# Patient Record
Sex: Male | Born: 1940 | Race: White | Hispanic: No | Marital: Married | Smoking: Current every day smoker
Health system: Southern US, Community
[De-identification: ages and names within clinical notes are randomized; demographics above are authoritative.]

## PROBLEM LIST (undated history)

## (undated) DIAGNOSIS — I2510 Atherosclerotic heart disease of native coronary artery without angina pectoris: Secondary | ICD-10-CM

## (undated) DIAGNOSIS — C801 Malignant (primary) neoplasm, unspecified: Secondary | ICD-10-CM

## (undated) HISTORY — PX: CATARACT EXTRACTION: SUR2

## (undated) HISTORY — PX: OTHER SURGICAL HISTORY: SHX169

## (undated) HISTORY — PX: LUNG REMOVAL, PARTIAL: SHX233

---

## 1999-01-15 ENCOUNTER — Ambulatory Visit (HOSPITAL_COMMUNITY): Admission: RE | Admit: 1999-01-15 | Discharge: 1999-01-15 | Payer: Self-pay | Admitting: Family Medicine

## 1999-01-30 ENCOUNTER — Ambulatory Visit: Admission: RE | Admit: 1999-01-30 | Discharge: 1999-01-30 | Payer: Self-pay | Admitting: Vascular Surgery

## 1999-02-06 ENCOUNTER — Ambulatory Visit: Admission: RE | Admit: 1999-02-06 | Discharge: 1999-02-06 | Payer: Self-pay | Admitting: Vascular Surgery

## 1999-02-11 ENCOUNTER — Inpatient Hospital Stay: Admission: RE | Admit: 1999-02-11 | Discharge: 1999-02-15 | Payer: Self-pay | Admitting: Vascular Surgery

## 1999-02-11 ENCOUNTER — Encounter (INDEPENDENT_AMBULATORY_CARE_PROVIDER_SITE_OTHER): Payer: Self-pay | Admitting: Specialist

## 1999-02-12 ENCOUNTER — Encounter: Payer: Self-pay | Admitting: Vascular Surgery

## 1999-05-21 ENCOUNTER — Encounter: Payer: Self-pay | Admitting: Vascular Surgery

## 1999-05-22 ENCOUNTER — Ambulatory Visit: Admission: RE | Admit: 1999-05-22 | Discharge: 1999-05-22 | Payer: Self-pay | Admitting: Vascular Surgery

## 1999-11-06 ENCOUNTER — Inpatient Hospital Stay: Admission: EM | Admit: 1999-11-06 | Discharge: 1999-11-13 | Payer: Self-pay | Admitting: *Deleted

## 1999-11-06 ENCOUNTER — Encounter: Payer: Self-pay | Admitting: Vascular Surgery

## 1999-11-07 ENCOUNTER — Encounter: Payer: Self-pay | Admitting: Vascular Surgery

## 1999-11-12 ENCOUNTER — Encounter: Payer: Self-pay | Admitting: Vascular Surgery

## 1999-11-27 ENCOUNTER — Encounter: Payer: Self-pay | Admitting: Vascular Surgery

## 1999-11-27 ENCOUNTER — Encounter: Admission: RE | Admit: 1999-11-27 | Discharge: 1999-11-27 | Payer: Self-pay | Admitting: Vascular Surgery

## 2000-07-20 ENCOUNTER — Encounter: Payer: Self-pay | Admitting: Thoracic Surgery

## 2000-07-20 ENCOUNTER — Inpatient Hospital Stay (HOSPITAL_COMMUNITY): Admission: EM | Admit: 2000-07-20 | Discharge: 2000-07-30 | Payer: Self-pay | Admitting: Emergency Medicine

## 2000-07-21 ENCOUNTER — Encounter: Payer: Self-pay | Admitting: Thoracic Surgery

## 2000-07-22 ENCOUNTER — Encounter: Payer: Self-pay | Admitting: Thoracic Surgery

## 2000-08-18 ENCOUNTER — Emergency Department (HOSPITAL_COMMUNITY): Admission: EM | Admit: 2000-08-18 | Discharge: 2000-08-18 | Payer: Self-pay | Admitting: Emergency Medicine

## 2000-08-18 ENCOUNTER — Encounter: Payer: Self-pay | Admitting: Emergency Medicine

## 2000-11-16 ENCOUNTER — Inpatient Hospital Stay (HOSPITAL_COMMUNITY): Admission: RE | Admit: 2000-11-16 | Discharge: 2000-11-27 | Payer: Self-pay | Admitting: Vascular Surgery

## 2000-11-16 ENCOUNTER — Encounter: Payer: Self-pay | Admitting: Vascular Surgery

## 2001-04-07 ENCOUNTER — Encounter: Payer: Self-pay | Admitting: Vascular Surgery

## 2001-04-08 ENCOUNTER — Ambulatory Visit (HOSPITAL_COMMUNITY): Admission: RE | Admit: 2001-04-08 | Discharge: 2001-04-08 | Payer: Self-pay | Admitting: Vascular Surgery

## 2001-04-12 ENCOUNTER — Encounter: Payer: Self-pay | Admitting: Vascular Surgery

## 2001-04-12 ENCOUNTER — Inpatient Hospital Stay (HOSPITAL_COMMUNITY): Admission: RE | Admit: 2001-04-12 | Discharge: 2001-04-20 | Payer: Self-pay | Admitting: Vascular Surgery

## 2003-04-18 ENCOUNTER — Encounter: Payer: Self-pay | Admitting: Emergency Medicine

## 2003-04-18 ENCOUNTER — Inpatient Hospital Stay (HOSPITAL_COMMUNITY): Admission: EM | Admit: 2003-04-18 | Discharge: 2003-04-19 | Payer: Self-pay | Admitting: Emergency Medicine

## 2003-04-18 ENCOUNTER — Encounter: Payer: Self-pay | Admitting: Internal Medicine

## 2006-07-11 ENCOUNTER — Emergency Department (HOSPITAL_COMMUNITY): Admission: EM | Admit: 2006-07-11 | Discharge: 2006-07-11 | Payer: Self-pay | Admitting: Emergency Medicine

## 2008-03-26 ENCOUNTER — Encounter: Payer: Self-pay | Admitting: Emergency Medicine

## 2008-03-26 ENCOUNTER — Inpatient Hospital Stay (HOSPITAL_COMMUNITY)
Admission: AD | Admit: 2008-03-26 | Discharge: 2008-03-30 | Payer: Self-pay | Admitting: Thoracic Surgery (Cardiothoracic Vascular Surgery)

## 2008-03-26 ENCOUNTER — Ambulatory Visit: Payer: Self-pay | Admitting: Thoracic Surgery (Cardiothoracic Vascular Surgery)

## 2010-03-27 ENCOUNTER — Emergency Department (HOSPITAL_COMMUNITY): Admission: EM | Admit: 2010-03-27 | Discharge: 2010-03-27 | Payer: Self-pay | Admitting: Emergency Medicine

## 2010-10-03 LAB — COMPREHENSIVE METABOLIC PANEL
ALT: 21 U/L (ref 0–53)
CO2: 28 mEq/L (ref 19–32)
Calcium: 8.9 mg/dL (ref 8.4–10.5)
Chloride: 108 mEq/L (ref 96–112)
Creatinine, Ser: 0.84 mg/dL (ref 0.4–1.5)
GFR calc Af Amer: >60 mL/min (ref >60)
GFR calc non Af Amer: >60 mL/min (ref >60)
Total Protein: 6 g/dL (ref 6.0–8.3)

## 2010-10-03 LAB — PROTIME-INR: Prothrombin Time: 14.3 seconds (ref 11.6–15.2)

## 2010-10-03 LAB — APTT: aPTT: 25 seconds (ref 24–37)

## 2010-10-03 LAB — DIFFERENTIAL
Eosinophils Relative: 4 % (ref 0–5)
Lymphocytes Relative: 41 % (ref 12–46)
Monocytes Absolute: 0.9 10*3/uL (ref 0.1–1.0)
Monocytes Relative: 9 % (ref 3–12)
Neutro Abs: 4.4 10*3/uL (ref 1.7–7.7)

## 2010-10-03 LAB — CK TOTAL AND CKMB (NOT AT ARMC)
CK, MB: 4.3 ng/mL — ABNORMAL HIGH (ref 0.3–4.0)
Total CK: 129 U/L (ref 7–232)

## 2010-10-03 LAB — CBC
HCT: 43.7 % (ref 39.0–52.0)
MCH: 32.7 pg (ref 26.0–34.0)
MCV: 95.3 fL (ref 78.0–100.0)
Platelets: 153 10*3/uL (ref 150–400)
RDW: 14.5 % (ref 11.5–15.5)
WBC: 9.5 10*3/uL (ref 4.0–10.5)

## 2010-12-03 NOTE — H&P (Signed)
[name], [name] NO.:  [id number]   MEDICAL RECORD NO.:  [id number]          PATIENT TYPE:  INP   LOCATION:  2307                         FACILITY:  [facility]   PHYSICIAN:  Salvatore Decent. Cornelius Moras, M.D. DATE OF BIRTH:  [birth year]   DATE OF ADMISSION:  03/26/2008  DATE OF DISCHARGE:                              HISTORY & PHYSICAL   PRESENTING CHIEF COMPLAINT:  Shortness of breath.   HISTORY OF PRESENT ILLNESS:  [name] is a 70 year old white male who  recently underwent right upper lobectomy via right thoracotomy at the  [facility] for what is presumed to be stage I lung cancer.  This procedure was performed on March 20, 2008.  The patient apparently  did well initially and according to the patient was discharged home on  the second postoperative day.  He was instructed to resume taking  Coumadin 7.5 mg daily at that time.  Apparently, arrangements were made  for a prothrombin time to be checked at the [facility]  later in the week, but the patient could not keep this appointment and  subsequently a followup appointment was scheduled for 2 weeks from now.  The patient presents to the emergency room today with increased pain and  shortness of breath.  Chest x-ray demonstrates right-sided opacity with  severe atelectasis of the remaining right middle and right lower lobe.  Chest CT scan confirms the presence of what appears to be hemothorax on  the right side with some compressive atelectasis of the remaining right  middle and right lower lobe.  Baseline prothrombin time performed in the  emergency department is greater than 90 seconds with INR estimated  greater than 9.8.  The [facility] was contacted, and the  physician on call recommended hospital admission and treatment locally  here in [place].   REVIEW OF SYSTEMS:  GENERAL:  The patient reports that he seemed to do  fairly well after his surgery, although he  complains that it was a big  surgery and he had a fair amount of pain.  He is convinced that he went  home to early from the hospital, although his early postoperative  recovery was apparently uneventful.  He has been eating some, although  he states his appetite is not back to normal.  RESPIRATORY:  The patient  reports some shortness of breath, which has increased substantially over  the last 24-48 hours.  The patient has persistent cough that predated  his surgery.  He denies hemoptysis or purulent sputum production.  The  patient is currently breathing comfortably on nasal cannula oxygen, 4  liters per minute, with oxygen saturations 98%.  CARDIAC:  The patient  denies any chest pain suspicious for angina pectoris.  The patient  denies any history of cardiac problems.  GASTROINTESTINAL:  Negative.  The patient reports normal bowel function.  GENITOURINARY:  Negative.  The patient has been voiding urine uneventfully including today.  MUSCULOSKELETAL:  Notable for persistent pain in the right chest wall  appropriate for his recent surgery.   PAST MEDICAL  HISTORY:  1. Longstanding tobacco abuse.  2. Chronic obstructive pulmonary disease.  3. Chronic bronchitis.  4. Benign prostatic hypertrophy.  5. Hypercholesterolemia.  6. Peripheral vascular disease.   PAST SURGICAL HISTORY:  1. Right upper lobectomy, March 20, 2008.  2. Bilateral infrainguinal bypass surgery for peripheral vascular      disease with symptomatic claudication.  These procedures were      performed more than 7 years ago at the [facility]      The patient has been doing well.   FAMILY HISTORY:  Noncontributory.   SOCIAL HISTORY:  The patient is married and lives with his wife here in  [place].  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  The patient has  longstanding history of heavy tobacco abuse, although he states that he  has  not smoked a cigarette since March 20, 2008.  He does drink alcohol  regularly.   MEDICATIONS:  Prior to admission;  1. Simvastatin 80 mg daily.  2. Oxybutynin 5 mg twice daily.  3. Coumadin 7.5 mg daily.  4. Citalopram 40 mg daily.  5. Hydrocodone with Tylenol as needed for pain.  6. Proventil inhaler every 6 hours.  7. Flunisolide nasal spray as needed.   DRUG ALLERGIES:  None known.   PHYSICAL EXAMINATION:  GENERAL:  The patient is a thin white male who  appears his stated age in no acute distress.  VITAL SIGNS:  Blood pressure is 120/60, pulse 70, normal sinus rhythm by  telemetry.  The patient is breathing comfortably on oxygen by nasal  cannula.  HEENT:  Unrevealing.  CHEST:  Auscultation of the chest reveals markedly diminished breath  sounds on the right side.  There is a right posterolateral thoracotomy  incision that is healing nicely.  There is a chest tube incision with  suture in place that is healing appropriately.  CARDIOVASCULAR:  Notable for regular rate and rhythm.  No murmurs, rubs,  or gallops noted.  ABDOMEN:  Soft, nondistended, and nontender.  There are no palpable  masses.  EXTREMITIES:  Warm and adequately perfused.  There is no lower extremity  edema.  There are multiple scars both sides in lower legs from previous  bypass surgery to both legs.  There is also a scar on the right upper  arm from cephalic vein procurement.  Distal pulses are not palpable in  either lower leg at the ankle.  RECTAL AND GU:  Both deferred.   LABORATORY DATA:  Baseline blood work included hemoglobin 12.0,  hematocrit 35%, white blood count 25,900, platelet count 417,000.  Serum  electrolytes are within normal limits.  BUN 16, creatinine 0.9,  prothrombin time measured greater than 90 seconds with INR greater than  9.8, PTT 136 seconds.   DIAGNOSTIC TESTS:  Portable chest x-ray and chest CT scans performed  today are reviewed.  These demonstrate opacity in the right  chest  consistent with loculated pleural effusion and fluid density on CT scan  consistent with probable hemothorax.  No other significant abnormalities  were noted.  There is compressive atelectasis of the remaining right  middle and right lower lobe, although there is still some aeration in  both of these lung.  These lobes are not completely collapsed.   IMPRESSION:  Delayed hemothorax, status post right upper lobectomy for  what is presumed to be stage I lung cancer.  This most likely occurred  due  to the fact that the patient is supratherapeutic on Coumadin.  The  patient is clinically stable at this point in time without signs of  ongoing blood loss, hypoperfusion, or respiratory distress.   PLAN:  We will monitor Mr. Graf carefully in the intensive care unit  and simultaneously reverse the effects of Coumadin with vitamin K.  Mr.  Soulliere will likely need at least a chest tube placement, if not a VATS  or thoracotomy for evacuation of his hemothorax.  As long as he remains  clinically stable, we will wait until his prothrombin time is at least  partially corrected.      Salvatore Decent. Cornelius Moras, M.D.  Electronically Signed     CHO/MEDQ  D:  03/26/2008  T:  03/26/2008  Job:  [id number]

## 2010-12-03 NOTE — Discharge Summary (Signed)
[name], CHALFIN NO.:  [id number]   MEDICAL RECORD NO.:  [id number]          PATIENT TYPE:  INP   LOCATION:  2014                         FACILITY:  [facility]   PHYSICIAN:  Salvatore Decent. Cornelius Moras, M.D. DATE OF BIRTH:  1941-04-25   DATE OF ADMISSION:  03/26/2008  DATE OF DISCHARGE:  03/30/2008                               DISCHARGE SUMMARY   HISTORY:  The patient is a 70 year old white male who underwent a right  upper lobectomy via right thoracotomy at the [facility]  for what is presumed to be a stage I lung cancer.  His procedure was  performed on March 20, 2008.  The patient did well and according to  him, he was discharged on the second postoperative day.  He was  instructed to resume taking Coumadin 7.5 mg daily at that time.  Apparently, arrangements were made to have a prothrombin time checked;  however, this appointment was not kept and subsequently was made for 2  weeks later.  The patient presented to the emergency room on the date of  admission with increased pain and shortness of breath.  Chest x-ray  demonstrated right-sided opacity with severe atelectasis of the right  middle and right lower lobes.  Chest CT scan confirmed the presence of  what appeared to be a hemothorax on the right side with some compressive  atelectasis of the remaining right middle and right lower lobes.  Baseline prothrombin time performed in the emergency department was  greater than 90 seconds with an INR estimated at greater than 9.8.  Dr.  Cornelius Moras was contacted for thoracic surgical consultation.  He contacted the  [facility], and the physician on-call recommended hospital  admission and local treatment here in [place].   PAST MEDICAL HISTORY:  1. Long-standing tobacco abuse.  2. COPD.  3. Chronic bronchitis.  4. Benign prostatic hyperplasia.  5. Hypercholesterolemia.  6. Peripheral vascular disease.  Peripheral vascular disease per the      patient  is the reason that he is on long-term warfarin      anticoagulation.   PAST SURGICAL HISTORY:  1. Right upper lobectomy on March 20, 2008.  2. Bilateral infrainguinal bypass surgery for peripheral vascular      disease with symptomatic claudication.  These procedures were      performed by Dr. Edwyna Shell and Dr. Hart Rochester [facility].   FAMILY HISTORY:  Noncontributory.   SOCIAL HISTORY:  Please see the dictated history and physical done at  the time of admission.   MEDICATIONS PRIOR TO ADMISSION:  1. Simvastatin 80 mg daily.  2. Oxybutynin 5 mg twice daily.  3. Coumadin 7.5 mg daily.  4. Citalopram 40 mg daily.  5. Hydrocodone with Tylenol as needed for pain.  6. Proventil inhaler every 6 hours.  7. Flunisolide nasal spray p.r.n. twice daily.   ALLERGIES:  No known.   PHYSICAL EXAMINATION:  Please see the history and physical done at the  time of admission.   HOSPITAL COURSE:  The patient  was admitted through the emergency  department.  He was started on a course of reversal of his Coumadin  anticoagulation with vitamin K.  Serial hematocrits were obtained.  Serial chest x-rays were obtained.  The patient's INR was corrected, and  his acute blood loss anemia was monitored closely.  Discussion was  undertaken as to the further plan regarding the hemothorax, and it was  deemed that the most appropriate therapy would be to proceed with chest  tube placement, which was done on March 27, 2008.  Initial return at  placement of chest tube was 700 mL.  The patient continued to have a  moderate output, but this improved overtime to minimal drainage.  Coumadin was not restarted.  He has remained clinically stable.  He is  tolerating gradual increasing activity.  He is using standard protocols.  His chest tube is discontinued on March 29, 2008.  Pending morning  round reevaluation, he is tentatively felt to be stable for discharge to   home on March 30, 2008.   INSTRUCTIONS:  The patient will receive written instructions in regard  to medications, activity, diet, wound care, and followup.  Followup  include his appointment in the Thoracic Surgical Clinic at the [facility] in [place] on March 31, 2008.  Determination  will be made at that time as to if and when to resume his Coumadin.   MEDICATIONS ON DISCHARGE:  He is to continue his simvastatin 80 mg  daily, oxybutynin 5 mg twice daily, Coumadin is currently on hold,  citalopram 40 mg daily, Proventil metered-dose inhaler every 6 hours,  flunisolide nasal spray twice daily, Protonix 40 mg daily, aspirin 81 mg  daily enteric-coated, oxycodone 5 mg every 4 hours p.r.n. as needed for  pain.   FINAL DIAGNOSIS:  Delayed hemothorax, status post right upper lobectomy  for presumed stage I lung cancer.  Full details of his lung cancer  diagnosis are available through the [facility].  The hemothorax is felt  to be related to supratherapeutic anticoagulation level on chronic  Coumadin therapy early following recent surgery.   OTHER DIAGNOSES:  1. Acute blood loss anemia.  2. Long-standing tobacco abuse.  3. Chronic obstructive pulmonary disease.  4. Chronic bronchitis.  5. Benign prostatic hyperplasia.  6. Hypercholesterolemia.  7. Peripheral vascular and arterial occlusive disease.  8. Previous surgeries as described above.      Rowe Clack, P.A.-C.      Salvatore Decent. Cornelius Moras, M.D.  Electronically Signed    WEG/MEDQ  D:  03/29/2008  T:  03/30/2008  Job:  [id number]   cc:   Thoracic Surgical Department [facility]

## 2010-12-06 NOTE — Discharge Summary (Signed)
. [facility]  Patient:    [name], [name] Visit Number: [id number] MRN: [id number]          Service Type: MED Location: 2000 2035 01 Attending Physician:  [name] Dictated by:   [name], P.A. Admit Date:  04/12/2001 Disc. Date: 04/20/01   CC:         Prepare copy for patient to take with him to the [facility] in [place], [place]                           Discharge Summary  DATE OF BIRTH:  1940-10-10  PRIMARY CARE PHYSICIAN:  Dr. Lesle Avila.  DISCHARGE DIAGNOSES:  1. 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  2. Patent left femoral to posterior tibial artery bypass.  3. Atrial fibrillation, paroxysmal, asymptomatic, lasting 10 minutes on     postoperative day #7.  SECONDARY DIAGNOSES:  1. 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  2. Chronic back pain.  3. Continued, long-term tobacco habituation.  4. Chronic obstructive pulmonary disease/chronic bronchitis.  5. Peripheral neuropathy.  6. Aortoiliac occlusive disease.  7. Status post aortobifemoral bypass with placement of Dacron Hemashield     conduit.  8. Status post left profunda femoris to above-knee popliteal bypass     utilizing greater saphenous vein - July 2000.  9. Status post removal of thrombosed left profunda femoris to above-knee     popliteal bypass, placement of left femoral to below-knee popliteal     artery bypass:  [place]-Tex conduit, July 2000. 10. Status post revision/thrombectomy of left femoral to popliteal bypass at     the time of aortobifemoral bypass. 11. Status post thrombectomy/revision of left femoral to below-knee popliteal     artery bypass ([place]-Tex conduit) with extension to tibioperoneal trunk     ([place]-Tex). 12. Status post left femoral to posterior tibial bypass in April 2002 with     placement of right greater saphenous vein as  conduit.  PROCEDURES:  April 12, 2001:  1. Conversion of left femoral to posterior tibial artery bypass to left     femoral to distal anterior tibial artery bypass - using composite     right upper extremity cephalic vein anastomosed to existing left     femoral to posterior tibial artery bypass conduit.  2. Exploration of anastomosis of femoral to anterior tibial bypass with     Fogarty thrombectomy/intraoperative arteriogram - Dr. Quita Avila. [name],     Careers adviser.  DISCHARGE DISPOSITION:  [name] is ready for discharge on postoperative day #8.  He has done well in the postoperative period.  He has remained afebrile.  His wounds are healing nicely.  There were approximately using only vertical mattress sutures at the skin level.  There is no drainage or erythema noted.  His pain is controlled with oral Percocet.  [name] was maintained on IV heparin postoperatively until his Coumadin could be started. His Coumadin was started on postoperative day #1 and has been rather slow to come to therapeutic levels.  Ankle-brachial indexes were obtained on postoperative day #1 and they were compared with ankle-brachial indexes taken August 12.  On the left, they had risen from 0.41 to 1.07; on the right they had fallen from 0.76 on August 12 to 0.63.  [name] had been seen by physical  therapy; consult was obtained on postoperative day #3 and they have been actively ambulating him with the use of a walker for the last five days. On postoperative day #7 while climbing stairs with physical therapy, [name] experienced a burst of asymptomatic paroxysmal atrial fibrillation with heart rates in the 130s.  He converted spontaneously to a sinus rhythm. Electrolytes drawn at that time showed the potassium was normal at 4.0. Electrocardiographic study showed no ischemic changes present.  The patient did express some concern about this dysrhythmia; he has not had prior history of this as far  as he knows, and after discussion with Dr. Hart Avila it was decided that he would present to the [facility] for cardiology evaluation/workup.  [name] has a palpable left dorsalis pedis pulse and the graft has remained patent in the postoperative period.  It is easily palpable at the medial aspect of the left ankle.  MEDICATIONS:  [name] had prior prescription for Coumadin previous to this hospitalization and he will go home anticoagulated with a target INR of 2.5, a Coumadin dose of 10 mg daily.  He goes home also on these other following medications:  1. Percocet 5/325 one to two tablets p.o. q.4-6h. p.r.n. pain.  2. Wellbutrin SR 150 mg twice daily.  3. He may use ibuprofen for pain not controlled well with Percocet - 200 mg     two tablets q.8h.  DISCHARGE ACTIVITY:  Ambulate often to build up strength.  DISCHARGE DIET:  Low sodium and low cholesterol diet.  SPECIAL INSTRUCTIONS:  He was asked not to drive until he sees Dr. Hart Avila in the office.  WOUND CARE:  He may shower daily beginning Tuesday, October 1.  He was asked not to take tub baths - only to shower.  FOLLOW-UP:  He will see Dr. Hart Avila Tuesday, May 04, 2001 at 2:30 p.m. and sutures will come out Tuesday, May 11, 2001 at 10 a.m.  Dr. Cleta Avila will handle [name] anticoagulation therapy.  [name] is to check in with Dr. Deforest Avila office Thursday, October 3.  Blood for PT/INR will be taken at that time.  As mentioned above, the target INR is 2.5 or so.  BRIEF HISTORY:  [name] is a 70 year old male with long history of infrainguinal arterial occlusive disease.  He has had multiple revascularization procedures.  He presented on March 01, 2001 for Doppler evaluation per follow-up protocol.  His left ankle-brachial index had decreased from 84 to 76%.  On a return visit on September 17, Doppler study showed severe vein graft stenosis at the distal anastomosis of his  left femoral to posterior tibial bypass.  This had been inserted November 16, 2000. The conduit was the greater saphenous vein from the right lower extremity.  The left ankle-brachial index is 41%; on September 17 right ankle-brachial index is 76%.  Dr. Hart Avila recommended angiography which was done September 19 previous to a revision of his left femoral to posterior tibial bypass.  It is important since this bypass preserves the viability of [name] left lower extremity.  [name] presents for left femoral to anterior tibial bypass using composite right greater saphenous vein graft/right cephalic vein as conduit. Dictated by:   [name], P.A. Attending Physician:  [name] DD:  04/19/01 TD:  04/19/01 Job: [id number]

## 2010-12-06 NOTE — Op Note (Signed)
[facility]  Patient:    [name], [name] Visit Number: [id number] MRN: [id number]          Service Type: DSU Location: [facility] 2852 01 Attending Physician:  Colvin Caroli Dictated by:   Quita Skye Hart Rochester, M.D. Proc. Date: 04/06/01 Admit Date:  04/08/2001                             Operative Report  PREOPERATIVE DIAGNOSIS:  Failing left femoral-to-posterior-tibial bypass graft with ischemic left leg.  POSTOPERATIVE DIAGNOSIS:  Failing left femoral-to-posterior-tibial bypass graft with ischemic left leg.  PROCEDURE:  Abdominal aortogram with bilateral lower extremity runoff via right common femoral approach with selective catheterization of left limb of aortobifemoral bypass graft.  SURGEON:  Quita Skye. Hart Rochester, M.D.  ANESTHESIA:  Local Xylocaine, Versed 2 mg intravenously.  DESCRIPTION OF PROCEDURE:  Patient was taken to the [facility]ipheral [facility]ovascular Lab and placed in the supine position, at which time both groins were prepped with Betadine solution and draped in routine sterile manner. After infiltration with 1% Xylocaine, the right common femoral artery was entered percutaneously and guidewire passed into the suprarenal aorta under fluoroscopic guidance.  A 5-French sheath and dilator were passed over the guidewire, the dilator removed and a standard pigtail catheter positioned in the suprarenal aorta, a flush abdominal aortogram performed, injecting 20 cc of contrast at 20 cc/sec.  This revealed the perirenal aorta to be widely patent with single widely patent renal arteries bilaterally.  There was an aortobifemoral graft anastomosed about 2 cm distal to the renal arteries which was widely patent, both limbs being patent down to the common femoral level, with some retrograde filling of the right external and internal iliac system.  The catheter was withdrawn into the terminal aorta and bilateral lower extremity runoffs  performed, injecting 88 cc of contrast at 8 cc/sec. This revealed the right limb to be patent into the common femoral artery with patency of the profunda femoris but total occlusion of the superficial femoral artery and reconstitution above the knee of the right popliteal artery, traversing the knee joint, and with three-vessel runoff on the right, the best vessel being the anterior tibial artery.  On the left, the left limb of the Y graft was also patent to the common femoral artery and there was a left femoral-to-posterior-tibial saphenous vein graft located medially in the leg which was anastomosed to the below-knee posterior tibial artery.  The vein graft was small but widely patent with no significant stenosis.  The posterior tibial artery was totally occluded from the anastomosis distally with no filling.  There was late filling of the anterior tibial and peroneal arteries throughout the lower two-thirds of the leg, both of which appeared diseased. To get better detail, the pigtail catheter was removed over the guidewire. Attempt was made to use a ______ catheter to select the left limb but the catheter tip wound not form properly, therefore, a Chuang catheter was utilize to selectively cannulate the left limb of the Y graft.  Using the peekhold technique, better definition of the distal runoff was obtained, with three additional views being obtained.  This revealed the anterior tibial artery to be a diseased vessel throughout the leg but patent across the ankle joint and filling the foot, with very diseased peroneal artery throughout and total occlusion of the posterior tibial artery.  The catheter was then removed over the guidewire, guidewire removed and  the sheath removed and adequate compression applied.  No complications ensued.  FINDINGS: 1. Widely patent aortobifemoral bypass graft. 2. Right superficial femoral occlusion with patent above-knee popliteal    artery and  three-vessel runoff, best vessel being the anterior tibial. 3. Patent left femoral-to-posterior-tibial bypass graft with total occlusion    of entire posterior tibial artery with patency of two diseased tibial    vessels, anterior tibial and peroneal artery, throughout the lower    two-thirds of the leg. Dictated by:   Quita Skye Hart Rochester, M.D. Attending Physician:  Colvin Caroli DD:  04/08/01 TD:  04/08/01 Job: [id number]

## 2010-12-06 NOTE — H&P (Signed)
[place]. [facility]  Patient:    [name], [name]                          MRN: [id number] Adm. Date:  11/16/00 Attending:  Quita Skye. Hart Rochester, M.D.                         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  POSTOPERATIVE DIAGNOSIS:  Profoundly ischemic left leg secondary to recurrent occlusion of left femoral-popliteal Gore-Tex bypass graft.  OPERATION: 1. Attempt thrombectomy left femoral-popliteal Gore-Tex graft with    intraoperative arteriogram. 2. Insertion of new left femoral to posterior tibial bypass using a    nonreversed saphenous vein graft from right leg with intraoperative    arteriogram. 3. Thrombectomy right femoral posterior tibial artery.  SURGEON:  Quita Skye. Hart Rochester, M.D.  FIRST ASSISTANTS:  Dominica Severin, P.A.; Maxwell Marion, RNFA; Tollie Pizza. Collins, P.A.-C.  ANESTHESIA:  General endotracheal.  PROCEDURE:  The patient was taken to the operating room, placed in the supine position, at which time satisfactory general endotracheal anesthesia was administered.  Both legs were prepped with Betadine scrub and solution, draped in a routine sterile manner.  Longitudinal incision was made in the left inguinal region through the previous scar.  The distal end of the left limb of aortobifemoral bypass graft was dissected free for proximal control and the Gore-Tex graft, which had been anastomosed to the distal portion of this left limb was also exposed, encircled with vessel loops.  Control of the profunda femoris arteries was also obtained and 5000 units of heparin given intravenously.  A longitudinal opening was made in the Gore-Tex graft proximally.  A Fogarty was passed distally and met obstruction at the level of the tibioperoneal trunk but would traverse this, and would go down to about the ankle level but no further.   Multiple passes with the Fogarty yielded some clot initially but no further debris.  An intraoperative arteriogram was performed which revealed a patent Gore-Tex graft down to the tibioperoneal trunk but severe disease in the tibioperoneal trunk, poor filing of the peroneal artery distally, with the best vessel being the posterior tibial artery which did fill down to the ankle level.  Anterior tibial artery was totally occluded.  It was felt that the best likelihood of salvage of this lower extremity would be to take saphenous vein from the contralateral leg and insert a new femoral to posterior tibial bypass into this artery distal to where the Gore-Tex had been inserted.  Therefore, the posterior tibial artery was exposed about 5-6 cm distal to its origin where it was a normal-appearing vessel, encircled with vessel loops.  Saphenous vein was removed from the right leg through multiple incisions along the medial aspect of the right leg, the branches ligated with 4 and 5-0 silk ties and divided.  It was gently dilated with heparinized saline and marked for orientation purposes.  It was an adequate vein, being at least 3 mm in size in its smallest area.  Using a partial occlusion clamp the hood of the Dacron in the left common femoral region was partially occluded, an elliptical opening made with a 15 blade, and part of the graft was excised, the proximal end of the saphenous vein was anastomosed end-to-side using continuous 6-0 Prolene.  Clamps were then released and there  was a good pulse down to the first set of competent valves. Using a retrograde valvulotome the valves were rendered incompetent with resultant excellent flow out the distal end of the vein graft.  The vein was then delivered through a subcutaneous medial tunnel and delivered into the distal wound.  The posterior tibial artery was occluded with vessel loops proximally and distally, was opened with a 15 blade, extended with  the Pott scissors.  It was 3 mm vessel.  There was very poor inflow.  There was no thrombus in the vessel.  A 3 Fogarty catheter was passed distally down to the ankle level where it met obstruction.  Upon return a small amount of debris was retrieved and there was some back-bleeding, which was poor.  Heparin saline could be flushed under low resistance, however.  Fogarty catheter was passed proximally, would go up to the tibioperoneal trunk where it was severely diseased.  The vein graft was then carefully measured and spatulated and anastomosed end-to-side using continuous 6-0 Prolene, the clamps released, and there was an excellent pulse in the vein graft and the posterior tibial artery.  Intraoperative arteriogram, however, revealed filling only down to the ankle level where there was obstruction of the vessel, and with the Doppler there was obstructive-type signal at the ankle level and in the vein graft.  I waited about 10 to 15 minutes and the Doppler signal had not improved, therefore made a longitudinal opening in the hood of the vein graft to further explore this one more time to make sure there was no debris in the posterior tibial artery and a #2 Fogarty catheter was passed distally down to the ankle level where it met obstruction.  Upon return no debris was removed; there was excellent inflow through the vein graft.  The hood of the vein graft was then closed with a 7-0 Prolene suture, the clamp released, and there continued to be an excellent pulse in the vein graft.  There were no further options available with the vein graft being the best conduit into the best vessel, which was the posterior tibial artery.  It was felt that there was probably in situ thrombosis distally as well as severe disease of the distal vessels.  No further options existed; therefore, it was decided to anticoagulate the patient to see if the foot would improve with no further options.  The wounds were  closed in layers with Vicryl in a subcuticular fashion.  A Al Pimple drain was brought out through an inferiorly-based stab wound in the thigh to drain the groin area on the left and following  closure of the wounds with Vicryl and clips, sterile dressing applied. Patient taken to the recovery room in satisfactory condition. DD:  11/16/00 TD:  11/17/00 Job: [id number]

## 2010-12-06 NOTE — Op Note (Signed)
[place]. [facility]  Patient:    [name], [name] Visit Number: [id number] MRN: [id number]          Service Type: Attending:  Quita Skye. Hart Rochester, M.D. Dictated by:   Quita Skye Hart Rochester, M.D. Proc. Date: 04/12/01                             Operative Report  PREOPERATIVE DIAGNOSIS:  Severely ischemic left leg secondary to occlusion of left posterior tibial artery with patent left femoral to posterior tibial bypass.  POSTOPERATIVE DIAGNOSIS:  Severely ischemic left leg secondary to occlusion of left posterior tibial artery with patent left femoral to posterior tibial bypass.  OPERATION: 1. Conversion of left femoral to posterior tibial bypass to a left femoral to    distal anterior tibial bypass using a composite cephalic vein graft from    right arm with intraoperative arteriogram. 2. Exploration of anastomosis of femoral to anterior tibial bypass with    Fogarty thrombectomy and intraoperative arteriogram.  SURGEON:  Quita Skye. Hart Rochester, M.D.  FIRST ASSISTANT:  Larina Earthly, M.D.  SECOND ASSISTANT:  Loura Pardon, P.A.  ANESTHESIA:  General endotracheal.  DESCRIPTION OF PROCEDURE:  Patient was taken to the operating room, placed in the supine position, at which time satisfactory general endotracheal anesthesia was administered.  The left leg and right arm were prepped with Betadine scrub and solution, draped in a routine sterile manner.  The cephalic vein was removed from the right upper extremity from the deltopectoral groove down to the proximal forearm, its branches ligated with 4-0 and 5-0 silk ties and divided.  It was removed, gently dilated with heparinized saline, marked for orientation purposes.  It was an excellent vein, being at least 4 to 4.5 mm in size throughout.  The distal end of the functioning femoral to posterior tibial graft - which as anastomosed to the proximal posterior tibial artery below the knee - was dissected free.  The vein graft  was dissected down to its anastomosis.  The posterior tibial artery was totally occluded throughout. The vein was circumferentially mobilized to be converted to a femoral to anterior tibial graft.  A lateral incision was made over the anterior compartment just above the ankle, carried down through the flexion crease. The anterior tibial artery was exposed and encircled with vessel loops. Decision was made to tunnel the vein graft anteriorly because of severe scarring in the popliteal fossa below the knee from a previous Gore-Tex graft, and therefore a tunnel was made anteriorly, carefully tunnelling the vein graft below the extensor tendons to facilitate the vein going to the distal anterior tibial artery.  The patient was heparinized, the previous vein graft was transected and ligated distally at the anastomotic area.  It was spatulated and the cephalic vein was used in a reverse fashion, spatulated, and anastomosed end-to-end using continuous 6-0 Prolene.  The clamps were released and there was good flow out of the distal end of the vein graft.  The vein was carefully delivered through the tunnel, being careful to eliminate any constricting areas as much as possible.  It was delivered into the anterior tibial wound, the anterior tibial artery occluded proximally and distally with vessel loops, opened with the 15 blade, extended with the Pott scissors.  It would accept a 2.5 mm dilator proximally and distally and very poor back-bleeding but no thrombus.  The vein was carefully measured and spatulated and anastomosed end-to-side using continuous  6-0 Prolene for the heel, interrupted 7-0 Prolene for the toe.  Following completion of this, the vessel loops were released and there was an excellent pulse and initially very good Doppler flow in the graft and in the foot.  Intraoperative arteriogram revealed an area of compression in the mid portion of the cephalic vein graft.  The excision was  extended distally to expose this and there was a small fascial band constricting this which was relived, eliminating this compression.  Adequate hemostasis was achieved, the patient was given protamine to reverse the heparin, and the wounds were closed with Vicryl and clips.  At the completion of the closure, it was noticed that the pulse had diminished distally and Doppler flow had diminished significantly in the foot, and it was felt that the graft was occluding.  Therefore, the patient was reheparinized with an additional 5000 units of heparin, the wounds were all opened and explored.  Anterior tibial artery was again encircled with vessel loops, the distal end of the vein graft was opened longitudinally, and a #3 Fogarty catheter would easily traverse this up into the femoral artery.  Upon return there was area of narrowing and there seemed to be good antegrade flow. There was no thrombus within the anterior tibial artery both proximally and distally - the Fogarty having been passed both directions.  Incision was reclosed with 7-0 Prolene suture, vessel loops released, and the incision medially was extended distally to be sure there were no fascial bands constricting the vein graft.  The vein graft appeared to be free on any compression.  It was possible that it was being compressed by the extensor tendon although there seemed to be plenty of room beneath this, and it was carefully inspected and was not able to duplicate any compression in the operating room.  Rather than close the wounds with deep and superficial sutures, the wound was reclosed with just interrupted 3-0 nylon vertical mattress sutures to lessen the likelihood of compression with the closure, and when this was completed intraoperative arteriogram repeated.   The vein graft was widely patent with very minimal compression near the ankle region, not felt to be significant.  There continued to be good Doppler flow in the  foot. The patient taken to the recovery room in satisfactory condition. Dictated by:   Quita Skye Hart Rochester, M.D. Attending:  Quita Skye. Hart Rochester, M.D.  DD:  04/12/01 TD:  04/12/01 Job: [id number]

## 2010-12-06 NOTE — H&P (Signed)
[place]. [facility]  Patient:    [name], [name] Visit Number: [id number] MRN: [id number]          Service Type: Attending:  Quita Skye. Hart Rochester, M.D. Dictated by:   Durenda Age, P.A.-C. Adm. Date:  04/12/01   CC:         Fayne Norrie, M.D.  Dr. Cleta Alberts   History and Physical  DATE OF BIRTH:  [birth year].  CHIEF COMPLAINT:  Peripheral vascular occlusive disease.  HISTORY OF PRESENT ILLNESS:  [name] is a pleasant 70 year old white male with a long history of peripheral vascular occlusive disease and multiple revascularization procedures, who presented in August 2002, for protocol Doppler evaluation following these procedures.  His left ABI had decreased at that point from 84% to 76%.  Returned visit was recommended.  On April 06, 2001, his Doppler showed severe vein graft stenosis at the distal anastomosis of his left fem to posterior tibial bypass, inserted on November 16, 2000, using saphenous vein from the right leg.  The left ABI is now decreased to 41%, and the right ABI is 76%.  Dr. Hart Rochester recommends to proceed with angiogram on April 12, 2001, previously scheduled revision of the left fem to posterior tibial bypass to prevent thrombosis since the viability of the leg is dependent on the bypass, on April 12, 2001.  The patient complains of buttock, hip and thigh pain, which are present on ambulation.  The left calf, has a burning and aching discomfort, accompanied by mild numbness.  The pain at time is present at rest, but is more pronounced during ambulation.  He denies any slow healing ulcers or gangrenous changes. He does have ischemic changes in the leg, as well as peripheral edema.   No significant decrease in temperature, no shortness of breath or dyspnea on exertion, no chest pain or palpitations.  PAST MEDICAL HISTORY: 1. Peripheral vascular occlusive disease - claudication.  2. Chronic back pain. 3. History of  tobacco abuse. 4. Chronic obstructive pulmonary disease - chronic bronchitis. 5. Peripheral neuropathy.  PAST SURGICAL HISTORY: 1. Status post left fem posterior tibial bypass graft in April 2002. 2. Status post left peripheral ______ to above the knee popliteal bypass    graft with saphenous vein graft in July 2000. 3. Status post multiple other revascularization procedures with revisions. 4. Status post mastoid surgery unknown side in 1964.  MEDICATIONS: 1. Coumadin discontinued on April 06, 2001. 2. Wellbutrin 150 mg q.d.  ALLERGIES:  SHELL FISH.  REVIEW OF SYSTEMS:  See HPI and past medical history for significant positives.  FAMILY HISTORY:  Mother died of stroke and a history of MI at age 8.  Father alive at 71.  One sister died at 43 of suicide.  SOCIAL HISTORY:  Married, 3 children.  He is a Licensed conveyancer.  He is trying to quit the use of 1-1/2 to 2 packs of cigarettes for which he smoked for 50 years.  He is down to 1 to 2 cigarettes a day.  He drinks 2-3 beers a day.  PHYSICAL EXAMINATION:  GENERAL:  Well-developed, well-nourished 70 year old white male in no acute distress, although in significant degree of discomfort when ambulating. Alert and oriented x 3.  VITAL SIGNS:  Blood pressure 130/80, pulse 64, respirations 18.  HEENT:  Normocephalic and atraumatic.  PERRLA, EOMI.  Funduscopic exam within normal limits.  NECK:  Supple.  No JVD, bruits, or lymphadenopathy.  CHEST:  Symmetrical on inspirations.  LUNGS:  Clear to  auscultation.  CARDIOVASCULAR:  Regular rate and rhythm.  No rubs or gallops.  ABDOMEN:  Soft.  Nontender.  Bowel sounds x 4.  NO masses or bruits.  GU/RECTAL:  Deferred.  EXTREMITIES:  No clubbing. There are ischemic changes in the left lower extremity, with a red and blue discoloration.  He also decreased temperature in his foot.  No significant edema.  No ulcerations.  PERIPHERAL PULSES:  Carotid 2+ bilaterally, femoral 2+  bilaterally with bruits, popliteal dorsalis pedis 1+ bilaterally, posterior tibialis nonpalpable on the left, 1+ on the right.  NEUROLOGIC:  Nonfocal.  The patient leans to the right secondary to the pain. Muscle strength 5/5.  DTRs 1+ bilaterally.  ASSESSMENT AND PLAN:  Angiogram on April 12, 2001, by Dr. Durwin Nora, followed by repair of the left fem-tibial vein graft stenosis with right leg or left arm vein on April 12, 2001.  Dr. Hart Rochester has seen and evaluated this patient prior to the admission and explained the risks and benefits involving the procedure and the patient has agreed to continue. Dictated by:   Durenda Age, P.A.-C. Attending:  Quita Skye. Hart Rochester, M.D. DD:  04/07/01 TD:  04/07/01 Job: [id number]

## 2010-12-06 NOTE — Op Note (Signed)
[place]. [facility]  Patient:    [name], [name]                        MRN: [id number] Proc. Date: 07/22/00 Adm. Date:  [id number] Attending:  Cameron Proud                           Operative Report  PREOPERATIVE DIAGNOSIS:  Thrombosis of left femoral popliteal bypass graft.  POSTOPERATIVE DIAGNOSIS:  Thrombosis of left femoral popliteal bypass graft.  PROCEDURE:  Thrombectomy left femoral popliteal bypass graft with revision using Gore-Tex to the tibial perineal trunk.  SURGEON:  Dorita Sciara, M.D.  FIRST ASSISTANT:  Maxwell Marion, RNFA  ANESTHESIA:  General.  BRIEF HISTORY:  This patient came to the emergency room with severe ischemia of the leg and was admitted, and started on heparin and had an arteriogram which showed occlusion of the left femoral popliteal bypass graft, his aortobifemoral graft was opened.  He had ischemia of the foot and an attempt was made to lyse the graft with thrombolysis but this was unsuccessful so he was brought to the operating room.  Arteriogram showed what may be a small anterior tib, no popliteal artery, a tibial perineal trunk with a perineal and a posterior tib.  He had had a previous femoral popliteal bypass in which he had had used veins so there was not much vein left in the left leg.  After general anesthesia, and prepping and draping of the left leg, an incision was made below the knee and dissection was carried down.  The remnant of the vein was identified and dissected out.  It was 2-3 mm in size.  The gastroc was reflected medially and part of the soleus was divided exposing the popliteal artery which was markedly diseased and calcific.  Dissection was carried down to the tibial perineal trunk which was still diseased but was softer.  This was dissected out and a small posterior tibial and a large perineal artery were dissected out and looped with vascular tape.  Then the anterior tib was  dissected out also.  Another incision was made above the knee and the previous incision was opened, and dissection was carried down through the subcutaneous tissue down to the previous Gore-Tex graft.  This was dissected down.  This had been put into the popliteal artery that was occluded.  It was dissected out and looped with a vascular tape.  Then the distal saphenous vein was harvested from the ankle of the medial malleolus superiorly and it was only 2-3 mm in size and not usable.  So there was no vein to use for extension of the clotted Gore-Tex graft so it was decided to use 5 mm Gore-Tex that was ringed as it crossed the knee was brought through and the patient was given 6000 units of heparin.  The old femoral popliteal Gore-Tex graft was clamped, opened longitudinally and a proximal thrombectomy carried out with a 5 Fogarty catheter with excellent inflow.  IT was then reclamped, and divided and the distal Gore-Tex graft was oversewn with 0 Vicryl, and a section removed.  Then the new Gore-Tex graft was cut tangentially and an end-to-end anastomosis performed with 5-0 Prolene in a running continuous fashion and the other graft, the distal part, was cut tangentially.  The tibial perineal trunk was opened, and it was patent and a 3 Fogarty catheter could be passed  down the perineal, it would not preferentially go down the posterior tibia but did go down the perineal easily.  The incision in the tibial perineal trunk was opened, superiorly, and the popliteal artery was found to be occluded as well as the takeoff of the anterior tibial artery that was also occluded.  These were just ligated since they were occluded and the Gore-Tex graft was cut tangentially in an end-to-side anastomosis which was a functional end-to-end anastomosis was put into the tibial perineal trunk with a 6-0 Prolene in a running continuous fashion.  All clamps were removed, and there was some leak from the  distal anastomosis which was repaired with 1 6-0 Prolene suture.  Then an arteriogram was done by injected 30 cc of dye into the Gore-Tex graft and it showed a patent anastomosis with no narrowing.  The perineal was the largest of the two vessels but the posterior tibia was opened.  The anterior tibial was occluded as was thought and these went down to the ankle.  Good Doppler flow was heard in the ankle.  The distal incision of the harvested vein was closed with 2-0 Vicryl and ethicon skin clips.  The other incisions were closed with interrupted 2-0 Vicryl in the muscle layer, 3-0 Vicryl in the subcutaneous tissue and ethicon skin clips.  Dry sterile dressing was applied and the patient returned to the recovery room in stable condition. DD:  07/22/00 TD:  07/22/00 Job: [id number]

## 2010-12-06 NOTE — Discharge Summary (Signed)
NAME:  [name], [name]                           ACCOUNT NO.:  [id number]   MEDICAL RECORD NO.:  [id number]                   PATIENT TYPE:  INP   LOCATION:  0378                                 FACILITY:  [facility]   PHYSICIAN:  Corinna L. Lendell Caprice, MD             DATE OF BIRTH:  1941-05-15   DATE OF ADMISSION:  04/18/2003  DATE OF DISCHARGE:  04/19/2003                                 DISCHARGE SUMMARY   DIAGNOSES:  1. Chest pain, most likely pleurisy.  2. Acute bronchitis.  3. Chronic obstructive pulmonary disease.  4. Abnormal CT scan.  Needs followup CT in three months.  5. Tobacco abuse.  6. Peripheral vascular disease.   DISCHARGE MEDICATIONS:  1. Azithromycin 250 mg p.o. daily for 4 more days.  2. He is to continue Coumadin 7.5 mg p.o. nightly.  3. May take Tylenol or other over-the-counter analgesic as needed.  4. He is encouraged to quit smoking, and I have written for nicotine     patches.   FOLLOW UP:  He may follow up with his primary care physician as needed and  certainly should have an INR checked within the next week or two.   CONDITION ON DISCHARGE:  Stable.   ACTIVITY:  Ad lib.   DIET:  Regular.   LABORATORY DATA:  Pertinent labs: Complete metabolic panel is essentially  normal. CPK-MB and troponin normal x 2.  INR was 1.1.  CBC 11.9, otherwise  normal, 83% segs, 9.9% lymphocytes.   Chest x-ray showed cardiomegaly, otherwise negative.   EKG showed normal sinus rhythm with poor R wave progression and PVC.   CT of the chest showed no evidence of PE, but there was a patchy focal  pneumonitis in the right upper lobe versus early changes of bronchoalveolar  carcinoma.  Needs repeat followup CT in three months.   HISTORY AND HOSPITAL COURSE:  [name] is a 70 year old white male with  history of severe  peripheral vascular disease and smoking, who presented to  the hospital with severe chest pain which was worse with coughing and  movement.  He had  recently had a long car trip, and therefore, PE was ruled  out by CT of the chest and lower extremities.  The patient was given Toradol  and Dilaudid.  He was also given aspirin.  Coumadin levels were  subtherapeutic, and this was resumed.  He was admitted to telemetry where he  remained in normal sinus rhythm and ruled out for MI.   He did, after further questioning, admit to a cough which was worse than his  usual chronic cough.  I suspect this chest pain is pleurisy versus  musculoskeletal.  I have started him on a Z-Pak, and he may continue NSAIDs  or Tylenol.  I have explained to him the importance of getting a repeat CT  of the chest, although clinically this does not appear to  be bronchoalveolar  carcinoma.   The patient has a history of smoking and used to work in a [employer].  Therefore, a lot of the changes seen on CT scan may be due to the wood dust.                                               Corinna L. Lendell Caprice, MD    CLS/MEDQ  D:  04/19/2003  T:  04/19/2003  Job:  [id number]

## 2010-12-06 NOTE — H&P (Signed)
[place]. [facility]  Patient:    [name], [name]                        MRN: [id number] Adm. Date:  [id number] Attending:  Colvin Caroli Dictator:   Dominica Severin, P.A. CC:         CVTS Office   History and Physical  DATE OF BIRTH:  [birth year]  CHIEF COMPLAINT:  Ischemic left leg.  HISTORY OF PRESENT ILLNESS:  This is a 70 year old, Caucasian male with a well known history of multiple peripheral revascularization procedures who presented to the CVTS Office today with ischemic left leg.  He was complaining of pain and numbness of the lower extremities.  The Duplex demonstrated that the left femoral popliteal bypass graft had no audible pulses in left lower extremity.  The patient was then sent over from the office for evaluation in the hospital and to be admitted for possible thrombectomy, as well as possible revision of femoral popliteal bypass graft.  PAST MEDICAL HISTORY: 1. Peripheral vascular disease. 2. Chronic back pain. 3. Chronic nicotine abuse.  PAST SURGICAL HISTORY: 1. Left profundofemoralis to above knee popliteal bypass graft with    saphenous vein graft in July 2000. 2. Removal of thrombosed left femoral popliteal with profundal repair,    left external iliac common femoral endarterectomy with a Dacron patch    angioplasty, left femoral to popliteal below popliteal bypass graft    with Gore-Tex done in July 2000. 3. Aortobifemoral bypass with Hemashield graft, thrombectomy of left    femoral popliteal graft as well as revision of the femoral to popliteal    graft. 4. Exploration of abdominal wound. 5. Thrombectomy and revision of left femoral to popliteal with Gore-Tex to    tibial peroneal trunk.  REVIEW OF SYSTEMS:  The patient denies any history of coronary artery disease. He does state he has a history of chronic bronchitis.  He denies any hemoglobin, diabetes mellitus, asthma, stroke, COPD, GI or genitourinary problems.  Denies any liver or thyroid problems.  Denies any bleeding tendencies or dyspnea on exertion or paroxysmal nocturnal dyspnea.  SOCIAL HISTORY:  The patient states he drinks a couple of beers a day.  He currently smokes 1-1/2 to 2 packs per day, since he was 70 years old. He is married and lives with is family.  He does not work and has three children.  FAMILY HISTORY:  Noncontributory.  MEDICATIONS AT HOME: Coumadin 5 mg once a day as well as Tylenol as needed.  ALLERGIES:  The patient is allergic to shellfish.  PHYSICAL EXAMINATION:  VITAL SIGNS:  Temperature is 97.3, pulse 63, blood pressure 135/68, height 5 feet 9 inches, weight 180 pounds.  HEENT: Head is normocephalic and atraumatic.  PERRLA/EOMI.  Nose is patent.  NECK:  Supple without thyromegaly, bruits or lymphadenopathy.  CHEST:  Demonstrates mild bibasilar crackles with good expansion and equal bilaterally.  CARDIOVASCULAR:  The regular rate and rhythm without murmurs, gallops, or rubs.  GI: Positive bowel sounds.  Soft and nontender without hepatosplenomegaly.  GENITOURINARY/RENAL:  Noncontributory.  EXTREMITIES:  The patient does not have any pulses in his left lower extremity and it is painful and numb. He does have range of motion and motor sensation. The patients right lower extremity has palpable pulses. It is warm and the patients bilateral upper extremities are within normal limits with palpable pulses bilaterally.  All extremities without edema.  ASSESSMENT:  Occluded  left femoral to tibial peroneal bypass; ischemic left lower extremity.  PLAN: The patient is to be taken to the operating room on November 16, 2000 for a thrombectomy with possible revision of left femoral to popliteal bypass graft. Further treatment will be determined after the surgery. DD:  11/19/00 TD:  11/20/00 Job: [id number]

## 2010-12-06 NOTE — Discharge Summary (Signed)
[place]. [facility]  Patient:    [name], [name]                        MRN: [id number] Adm. Date:  [id number] Disc. Date: 11/18/00 Attending:  Colvin Caroli Dictator:   Adair Patter, P.A. CC:         Dr. Deforest Hoyles   Discharge Summary  DATE OF BIRTH:  28-Apr-1941  ADMISSION DIAGNOSIS:  Ischemic left lower extremity.  SECONDARY DIAGNOSES: 1. Peripheral vascular disease. 2. Tobacco abuse. 3. Right groin hematoma. 4. Left foot drop.  DISCHARGE DIAGNOSIS:  Peripheral vascular disease.  PROCEDURES: 1. Attempted left femoral-popliteal bypass graft thrombectomy. 2. Left femoral-posterior tibial bypass graft. 3. Intraoperative arteriogram. 4. Postoperative ankle brachial indexes.  HOSPITAL COURSE:  Mr. Chlebowski was admitted to [facility] on November 16, 2000, secondary to experiencing numbness and pain of his left lower extremity. It was found that the patient was found to have an occluded left femoral-popliteal bypass graft.  Because of this, Dr. Hart Rochester attempted to thrombectomize this bypass graft; however, this was unsuccessful.  Because of this, the patient underwent a left femoral-posterior tibial bypass graft with nonreversed saphenous vein graft from the right leg.  No complications were noted during this procedure.  Postoperatively, the patient was found to have a patent bypass graft.  Graft remained patent throughout his hospital course. The patient was then anticoagulated with heparin.  Once heparin was therapeutic, the patient was also anticoagulated with Coumadin.  Once Coumadin was therapeutic, heparin was discontinued.  The patients hospital course was characterized by slow progress.  There was great difficulty found in achieving adequate pain control; however, his pain medications were adjusted with eventual control of his pain.  Postoperatively, the patient was also found to have a foot drop.  Because of this, physical therapy was  consulted to help the patient with passive and active range of motion for his foot.  He was also given a Multi-Podus boot secondary to his foot drop.  In addition to those problems, the patient also developed hematoma of his right groin.  This was felt to be due to oozing from the vein harvest site and anticoagulation. Incision and drainage was not required for this.  For the remainder of the patients hospital course, he continued to make slow progress and eventually began ambulating well with physical therapy.  He was felt to be stable for discharge on Nov 27, 2000.  MEDICATIONS AT TIME OF DISCHARGE: 1. Tylox one to two tablets q.4-6h. p.r.n. pain. 2. Coumadin:  The patient will take Coumadin as dictated by his final INR.  DISCHARGE INSTRUCTIONS:  Discharge activity:  The patient is told to avoid driving and strenuous activity.  Discharge diet:  Low fat, low salt.  Wound care:  The patient is told that he can shower and clean his incisions with soap and water.  DISPOSITION:  Home.  SPECIAL DISCHARGE INSTRUCTIONS:  The patient was instructed to use a Multi-Podus boot to be his left foot.  He is told to have his INR drawn by Dr. Deforest Hoyles in two to three days.  This will further dictate how much Coumadin he is to take.  DISCHARGE FOLLOWUP:  The patient is told to follow up with Dr. Hart Rochester at the CVTS office on Tuesday, May 21 at 12:40 p.m.  He was also told to report to CVTS office on May 16 at 9 oclock for staple removal. DD:  11/26/00  TD:  11/28/00 Job: [id number]

## 2010-12-06 NOTE — H&P (Signed)
NAME:  BRENN, DEZIEL NO.:  [id number]   MEDICAL RECORD NO.:  [id number]                   PATIENT TYPE:  EMS   LOCATION:  ED                                   FACILITY:  [facility]   PHYSICIAN:  Corinna L. Lendell Caprice, MD             DATE OF BIRTH:  April 15, 1941   DATE OF ADMISSION:  04/18/2003  DATE OF DISCHARGE:                                HISTORY & PHYSICAL   CHIEF COMPLAINT:  Chest pain.   HISTORY OF PRESENT ILLNESS:  Mr. Grassel is a 70 year old white male with a  history of tobacco abuse and peripheral vascular disease who has had chest  tightness since yesterday.  It is worse with inspiration and movement.  He  vomited once yesterday.  No fevers, chills, or cough.  He recently traveled  to [facility].  He denies leg pain or swelling.  He takes Coumadin but  he has skipped many doses recently because he left his medicine in [facility].  No history of coronary artery disease.  No history of thrombosis.   PAST MEDICAL HISTORY:  Peripheral vascular disease with multiple bypass  procedures.   MEDICATIONS:  Coumadin 7.5 mg p.o. daily.   ALLERGIES:  No known drug allergies.   SOCIAL HISTORY:  The patient smokes a pack of cigarettes a day.  He drinks  occasionally.   FAMILY HISTORY:  His mother had a stroke and myocardial infarction in her  45s.  One of his sisters committed suicide at age 29.   REVIEW OF SYSTEMS:  CONSTITUTIONAL:  As above.  HEENT:  No sore throat.  No  odynophagia.  No headaches.  RESPIRATORY:  Some shortness of breath.  No  wheezing.  CARDIOVASCULAR:  As above.  GASTROINTESTINAL:  No diarrhea,  otherwise as above.  GENITOURINARY:  No dysuria.  MUSCULOSKELETAL:  No  arthralgias or myalgias.  SKIN:  No rash.  PSYCHIATRIC:  No depression.  NEUROLOGIC:  No seizures.  ENDOCRINE:  No diabetes.  HEMATOLOGIC:  As above.   PHYSICAL EXAMINATION:  VITAL SIGNS:  Normal.  Please see nurses' notes.  Oxygen saturation in the mid to  upper 90s on room air.  GENERAL:  The patient is somewhat sleepy white male after receiving  Dilaudid.  He appears to be in pain when he moves.  HEENT:  Normocephalic, atraumatic.  Pupils are equal, round, and reactive to  light.  He has very dry mucous membranes.  NECK:  Supple.  No lymphadenopathy.  No thyromegaly.  No carotid bruits.  LUNGS:  Clear to auscultation bilaterally without wheezes, rhonchi, or  rales.  CARDIOVASCULAR:  Regular rate and rhythm without murmurs, rubs, or gallops.  He has no chest wall tenderness.  ABDOMEN:  Soft, nontender, nondistended.  GENITOURINARY:  Deferred.  RECTAL:  Deferred.  EXTREMITIES:  No clubbing, cyanosis, edema.  Pulses are diminished.  No calf  tenderness.  Homan's sign  negative.  SKIN:  He has multiple surgical scars which are well healed.  PSYCHIATRIC:  Normal affect.  NEUROLOGIC:  Sleepy, but oriented x3.  Cranial nerves and sensory motor  examination are intact.   LABORATORIES:  His first set of cardiac enzymes and troponin are normal.  His white blood cell count is 11.9.  CBC is otherwise normal.  PT is 13.7,  INR 1.1, PTT 26.  Complete metabolic panel is essentially normal.  Chest x-  ray shows cardiomegaly, otherwise negative.  EKG shows normal sinus rhythm  with poor R-wave progression.   ASSESSMENT/PLAN:  1. Chest pain.  I do not feel this is cardiac.  However, I will admit     patient to the telemetry unit and get another set of enzymes.  I will     give him a dose of aspirin.  I am more concerned about pulmonary embolus.     Will check a VQ scan.  Also consider musculoskeletal; he will get     nonsteroidal anti-inflammatories.  Given his recent episode of vomiting,     this may be gastritis or esophagitis, although this is atypical.  I will,     however, give Protonix for now.  2. Peripheral vascular disease with subtherapeutic Coumadin.  I will resume     the Coumadin.  3. Tobacco abuse.  Counseled against.                                                Corinna L. Lendell Caprice, MD    CLS/MEDQ  D:  04/18/2003  T:  04/18/2003  Job:  [id number]

## 2010-12-06 NOTE — Discharge Summary (Signed)
[place]. [facility]  Patient:    [name], [name]                        MRN: [id number] Adm. Date:  [id number] Disc. Date: 07/28/00 Attending:  Cameron Proud Dictator:   Maxwell Marion, RNFA CC:         Jodi Marble. Fredia Sorrow, M.D. at [facility]   Discharge Summary  DATE OF BIRTH:  31-Oct-1940  DATE OF SURGERY:  July 22, 2000  ADMITTING DIAGNOSIS:  Acute occlusion of his left femoral popliteal bypass graft.  PAST MEDICAL HISTORY:  Peripheral vascular disease, status post aortofemoral bypass graft and left femoral popliteal graft in April 2001.  ALLERGIES:  SHELLFISH.  DISCHARGE DIAGNOSIS:  Occlusion of left femoral popliteal graft, status post thrombolysis and revision of the graft.  HOSPITAL COURSE AND PROCEDURES:  On July 20, 2000 this 70 year old gentleman was admitted to [facility] and Dr. Edwyna Shell with complaints of pain and numbness x 24-48 hours in his left leg.  On January 1, he underwent an arteriogram and thrombolysis of his left femoral popliteal graft by Dr. Fredia Sorrow.  Following this, a heparin drip was started.  Later in the day, on January 1, Mr. Rippetoe underwent a lower extremity Doppler study which revealed that the left lower extremity remained without a pulse.  On January 2, he underwent a repeat arteriogram which revealed an occlusion at the distal anastomosis of the left femoral popliteal graft.  After discussion with Dr. Fredia Sorrow and Mr. Wieseler and his wife, and obtaining surgical consent, Dr. Edwyna Shell brought Mr. Baldini to the operating room and performed a thrombectomy and revision of his left femoral popliteal graft, revising it to the tibial peroneal trunk.  This was done with a 5 mm Gore-Tex graft.  At the conclusion of the procedure he was transferred to the PACU in stable condition.  Anticoagulation with the heparin drip was resumed.  On January 3, lower extremity Doppler studies were performed  again which revealed flow present in his left foot.  Mr. Cuffe postoperative course has been uneventful.  His left foot has remained warm and has also remained with posterior tibial and dorsalis pedis pulses by the Doppler.  He is making progress in ambulation and Dr. Edwyna Shell anticipates him ready for discharge home tomorrow, July 28, 2000.  CONDITION AND INSTRUCTIONS ON DISCHARGE:   Condition is improved.  Mr. Barkdull has been instructed regarding his activity, diet, medications, wound care, and follow-up.  Specifically, he is to have PT and INR drawn Thursday at [facility].  MEDICATIONS ON DISCHARGE: 1. Percocet 1-2 p.o. q.4-6h. p.r.n. for pain. 2. Coumadin - he will taking as directed by his INR results.  FOLLOW-UP:  He will have PT and INR drawn Thursday, January 10 at [facility].  He also has an appointment to see Dr. Edwyna Shell at the CVTS office on Tuesday, August 04, 2000 at 4:15 p.m. DD:  07/27/00 TD:  07/27/00 Job: [id number]

## 2011-04-23 LAB — CBC
HCT: 24.1 — ABNORMAL LOW
HCT: 29.2 — ABNORMAL LOW
HCT: 35.1 — ABNORMAL LOW
Hemoglobin: 8 — ABNORMAL LOW
Hemoglobin: 8.3 — ABNORMAL LOW
Hemoglobin: 9.6 — ABNORMAL LOW
Hemoglobin: 12 — ABNORMAL LOW
MCHC: 34
MCV: 93.5
Platelets: 352
RBC: 2.51 — ABNORMAL LOW
RBC: 2.74 — ABNORMAL LOW
RBC: 3.79 — ABNORMAL LOW
RDW: 14.2
RDW: 13.8
WBC: 12.6 — ABNORMAL HIGH
WBC: 14.2 — ABNORMAL HIGH
WBC: 17.3 — ABNORMAL HIGH
WBC: 22.8 — ABNORMAL HIGH

## 2011-04-23 LAB — BASIC METABOLIC PANEL
BUN: 11
CO2: 27
CO2: 27
Calcium: 8 — ABNORMAL LOW
Calcium: 8.1 — ABNORMAL LOW
Chloride: 104
GFR calc Af Amer: >60
GFR calc non Af Amer: >60
Sodium: 137

## 2011-04-23 LAB — URINE MICROSCOPIC-ADD ON

## 2011-04-23 LAB — COMPREHENSIVE METABOLIC PANEL
ALT: 21
Alkaline Phosphatase: 71
BUN: 16
CO2: 29
GFR calc non Af Amer: >60
Glucose, Bld: 153 — ABNORMAL HIGH
Potassium: 4.6
Sodium: 139
Total Bilirubin: 0.6
Total Protein: 6.1

## 2011-04-23 LAB — DIFFERENTIAL
Basophils Absolute: 0
Eosinophils Absolute: 0
Lymphocytes Relative: 17
Monocytes Absolute: 1.6 — ABNORMAL HIGH
Neutrophils Relative %: 77

## 2011-04-23 LAB — PROTIME-INR
INR: 1.4
INR: 1.5
INR: 1.6 — ABNORMAL HIGH
INR: >9.8
Prothrombin Time: 17.3 — ABNORMAL HIGH
Prothrombin Time: 19.7 — ABNORMAL HIGH
Prothrombin Time: >90 — ABNORMAL HIGH

## 2011-04-23 LAB — URINALYSIS, ROUTINE W REFLEX MICROSCOPIC
Bilirubin Urine: NEGATIVE
Glucose, UA: NEGATIVE
Hgb urine dipstick: NEGATIVE
Specific Gravity, Urine: >1.046 — ABNORMAL HIGH
Urobilinogen, UA: 0.2
pH: 5.5

## 2011-04-23 LAB — BODY FLUID CULTURE: Culture: NO GROWTH

## 2011-04-23 LAB — POCT CARDIAC MARKERS: CKMB, poc: <1 — ABNORMAL LOW

## 2011-06-10 ENCOUNTER — Ambulatory Visit (HOSPITAL_COMMUNITY)
Admission: RE | Admit: 2011-06-10 | Discharge: 2011-06-10 | Disposition: A | Discharge status: Home / Self Care | Payer: Non-veteran care | Source: Ambulatory Visit | Attending: Pathology | Admitting: Pathology

## 2011-06-10 DIAGNOSIS — I743 Embolism and thrombosis of arteries of the lower extremities: Secondary | ICD-10-CM | POA: Insufficient documentation

## 2011-06-10 DIAGNOSIS — I739 Peripheral vascular disease, unspecified: Secondary | ICD-10-CM | POA: Insufficient documentation

## 2011-06-10 NOTE — Progress Notes (Signed)
Bilateral lower extremity arterial duplex with ABI completed at 12:25.  Preliminary report is ABI is 0.69 on the right and >1.0 on the left. Smiley Houseman 06/10/2011, 2:51 PM

## 2013-04-26 ENCOUNTER — Encounter (HOSPITAL_COMMUNITY): Payer: Self-pay

## 2013-04-26 ENCOUNTER — Emergency Department (HOSPITAL_COMMUNITY)
Admission: EM | Admit: 2013-04-26 | Discharge: 2013-04-26 | Disposition: A | Discharge status: Home / Self Care | Payer: Medicare Other | Attending: Emergency Medicine | Admitting: Emergency Medicine

## 2013-04-26 DIAGNOSIS — Z9889 Other specified postprocedural states: Secondary | ICD-10-CM | POA: Insufficient documentation

## 2013-04-26 DIAGNOSIS — Z7982 Long term (current) use of aspirin: Secondary | ICD-10-CM | POA: Insufficient documentation

## 2013-04-26 DIAGNOSIS — Z85118 Personal history of other malignant neoplasm of bronchus and lung: Secondary | ICD-10-CM | POA: Insufficient documentation

## 2013-04-26 DIAGNOSIS — I2510 Atherosclerotic heart disease of native coronary artery without angina pectoris: Secondary | ICD-10-CM | POA: Insufficient documentation

## 2013-04-26 DIAGNOSIS — Z7902 Long term (current) use of antithrombotics/antiplatelets: Secondary | ICD-10-CM | POA: Insufficient documentation

## 2013-04-26 DIAGNOSIS — G8918 Other acute postprocedural pain: Secondary | ICD-10-CM | POA: Insufficient documentation

## 2013-04-26 DIAGNOSIS — F17200 Nicotine dependence, unspecified, uncomplicated: Secondary | ICD-10-CM | POA: Insufficient documentation

## 2013-04-26 DIAGNOSIS — Z792 Long term (current) use of antibiotics: Secondary | ICD-10-CM | POA: Insufficient documentation

## 2013-04-26 DIAGNOSIS — Z79899 Other long term (current) drug therapy: Secondary | ICD-10-CM | POA: Insufficient documentation

## 2013-04-26 DIAGNOSIS — H5710 Ocular pain, unspecified eye: Secondary | ICD-10-CM | POA: Insufficient documentation

## 2013-04-26 HISTORY — DX: Malignant (primary) neoplasm, unspecified: C80.1

## 2013-04-26 HISTORY — DX: Atherosclerotic heart disease of native coronary artery without angina pectoris: I25.10

## 2013-04-26 MED ORDER — FLUORESCEIN SODIUM 1 MG OP STRP
1.0000 | ORAL_STRIP | Freq: Once | OPHTHALMIC | Status: AC
  Administered 2013-04-26: 1 via OPHTHALMIC
  Filled 2013-04-26: qty 1

## 2013-04-26 MED ORDER — TETRACAINE HCL 0.5 % OP SOLN
2.0000 [drp] | Freq: Once | OPHTHALMIC | Status: AC
  Administered 2013-04-26: 2 [drp] via OPHTHALMIC
  Filled 2013-04-26: qty 2

## 2013-04-26 MED ORDER — ONDANSETRON HCL 4 MG/2ML IJ SOLN
4.0000 mg | Freq: Once | INTRAMUSCULAR | Status: AC
  Administered 2013-04-26: 4 mg via INTRAVENOUS
  Filled 2013-04-26: qty 2

## 2013-04-26 MED ORDER — HYDROMORPHONE HCL PF 1 MG/ML IJ SOLN
1.0000 mg | Freq: Once | INTRAMUSCULAR | Status: AC
  Administered 2013-04-26: 1 mg via INTRAVENOUS
  Filled 2013-04-26: qty 1

## 2013-04-26 MED ORDER — SODIUM CHLORIDE 0.9 % IV SOLN
Freq: Once | INTRAVENOUS | Status: AC
  Administered 2013-04-26: 01:00:00 via INTRAVENOUS

## 2013-04-26 NOTE — ED Notes (Signed)
Per pt, pt had surgery for torn lens today that occurred during cataract surgery.  Numbing meds have worn off.  Pt took 2 oxycodone with no relief.

## 2013-04-26 NOTE — ED Provider Notes (Signed)
CSN: [id number]     Arrival date & time 04/26/13  0008 History   First MD Initiated Contact with Patient 04/26/13 0111     Chief Complaint  Patient presents with  . Eye Pain   (Consider location/radiation/quality/duration/timing/severity/associated sxs/prior Treatment) HPI This is a 72 year old male who underwent a complex right lens replacement and retinal repair yesterday at the [facility]. This was in part a complication of an earlier surgery. His eye was locally anesthetized for surgery but the local anesthesia were awfully yesterday evening and he developed severe pain in the right eye. It was not relieved with the oxycodone he was prescribed. He states he has little vision in that eye, and he has kept it bandaged. There has been mucous drainage from the right eye.  Past Medical History  Diagnosis Date  . Coronary artery disease   . Cancer     lung   Past Surgical History  Procedure Laterality Date  . Cataract extraction    . Venous bypass    . Lung removal, partial     History reviewed. No pertinent family history. History  Substance Use Topics  . Smoking status: Current Every Day Smoker -- 0.50 packs/day    Types: Cigarettes  . Smokeless tobacco: Not on file  . Alcohol Use: Yes     Comment: social    Review of Systems  All other systems reviewed and are negative.    Allergies  Review of patient's allergies indicates not on file.  Home Medications   Current Outpatient Rx  Name  Route  Sig  Dispense  Refill  . albuterol (PROVENTIL HFA;VENTOLIN HFA) 108 (90 BASE) MCG/ACT inhaler   Inhalation   Inhale 2 puffs into the lungs every 6 (six) hours as needed for wheezing.         Marland Kitchen aspirin EC 81 MG tablet   Oral   Take 81 mg by mouth daily.         Marland Kitchen buPROPion (WELLBUTRIN SR) 150 MG 12 hr tablet   Oral   Take 150 mg by mouth 2 (two) times daily.         . citalopram (CELEXA) 40 MG tablet   Oral   Take 40 mg by mouth daily.         . clopidogrel  (PLAVIX) 75 MG tablet   Oral   Take 75 mg by mouth daily.         Marland Kitchen doxycycline (ADOXA) 50 MG tablet   Oral   Take 50 mg by mouth 2 (two) times daily.         Marland Kitchen oxyCODONE (OXY IR/ROXICODONE) 5 MG immediate release tablet   Oral   Take 5 mg by mouth every 6 (six) hours as needed for pain.         . pantoprazole (PROTONIX) 20 MG tablet   Oral   Take 20 mg by mouth daily.         . simvastatin (ZOCOR) 40 MG tablet   Oral   Take 40 mg by mouth every evening.         . tiotropium (SPIRIVA) 18 MCG inhalation capsule   Inhalation   Place 18 mcg into inhaler and inhale every morning.          BP 141/65  Pulse 70  Temp(Src) 97.3 F (36.3 C) (Oral)  Resp 18  SpO2 100%  Physical Exam General: Well-developed, well-nourished male in no acute distress; appearance consistent with age of record HENT: normocephalic;  atraumatic Eyes: extraocular muscles intact; left pupil 1 mm and sluggish; right pupil 6 mm and fixed with obvious surgical changes to the cornea and lens, conjunctival injection with mucoid discharge; right Tonometry 1, 1 and 2 (3 attempts). Neck: supple Heart: regular rate and rhythm; no murmurs, rubs or gallops Lungs: clear to auscultation bilaterally Abdomen: soft; nondistended; nontender; no masses or hepatosplenomegaly; bowel sounds present Extremities: No deformity; full range of motion; pulses normal Neurologic: Awake, alert and oriented; motor function intact in all extremities and symmetric; no facial droop Skin: Warm and dry Psychiatric: Normal mood and affect    ED Course  Procedures (including critical care time)  MDM  2:50 AM Pain well controlled with IV Dilaudid. He was advised to start taking his oxycodone when he returns home. He will followup at the [facility] later this morning. Tonometry is not consistent with acute angle closure glaucoma the    Hanley Seamen, MD 04/26/13 [phone]

## 2013-08-16 ENCOUNTER — Emergency Department (HOSPITAL_COMMUNITY): Payer: Medicare Other

## 2013-08-16 ENCOUNTER — Inpatient Hospital Stay (HOSPITAL_COMMUNITY)
Admission: EM | Admit: 2013-08-16 | Discharge: 2013-08-29 | DRG: 207 | Disposition: A | Discharge status: Hospice | Payer: Medicare Other | Attending: Pulmonary Disease | Admitting: Pulmonary Disease

## 2013-08-16 ENCOUNTER — Encounter (HOSPITAL_COMMUNITY): Payer: Self-pay | Admitting: Emergency Medicine

## 2013-08-16 DIAGNOSIS — J9600 Acute respiratory failure, unspecified whether with hypoxia or hypercapnia: Secondary | ICD-10-CM

## 2013-08-16 DIAGNOSIS — R599 Enlarged lymph nodes, unspecified: Secondary | ICD-10-CM | POA: Diagnosis present

## 2013-08-16 DIAGNOSIS — Y832 Surgical operation with anastomosis, bypass or graft as the cause of abnormal reaction of the patient, or of later complication, without mention of misadventure at the time of the procedure: Secondary | ICD-10-CM | POA: Diagnosis present

## 2013-08-16 DIAGNOSIS — R9431 Abnormal electrocardiogram [ECG] [EKG]: Secondary | ICD-10-CM | POA: Diagnosis present

## 2013-08-16 DIAGNOSIS — F411 Generalized anxiety disorder: Secondary | ICD-10-CM | POA: Diagnosis not present

## 2013-08-16 DIAGNOSIS — T827XXA Infection and inflammatory reaction due to other cardiac and vascular devices, implants and grafts, initial encounter: Secondary | ICD-10-CM | POA: Diagnosis present

## 2013-08-16 DIAGNOSIS — R6520 Severe sepsis without septic shock: Secondary | ICD-10-CM

## 2013-08-16 DIAGNOSIS — E872 Acidosis, unspecified: Secondary | ICD-10-CM | POA: Diagnosis not present

## 2013-08-16 DIAGNOSIS — Z7982 Long term (current) use of aspirin: Secondary | ICD-10-CM

## 2013-08-16 DIAGNOSIS — I469 Cardiac arrest, cause unspecified: Secondary | ICD-10-CM | POA: Diagnosis not present

## 2013-08-16 DIAGNOSIS — D696 Thrombocytopenia, unspecified: Secondary | ICD-10-CM | POA: Diagnosis present

## 2013-08-16 DIAGNOSIS — I428 Other cardiomyopathies: Secondary | ICD-10-CM | POA: Diagnosis present

## 2013-08-16 DIAGNOSIS — N170 Acute kidney failure with tubular necrosis: Secondary | ICD-10-CM | POA: Diagnosis not present

## 2013-08-16 DIAGNOSIS — Z85118 Personal history of other malignant neoplasm of bronchus and lung: Secondary | ICD-10-CM

## 2013-08-16 DIAGNOSIS — J441 Chronic obstructive pulmonary disease with (acute) exacerbation: Secondary | ICD-10-CM

## 2013-08-16 DIAGNOSIS — I5041 Acute combined systolic (congestive) and diastolic (congestive) heart failure: Secondary | ICD-10-CM

## 2013-08-16 DIAGNOSIS — F17200 Nicotine dependence, unspecified, uncomplicated: Secondary | ICD-10-CM | POA: Diagnosis present

## 2013-08-16 DIAGNOSIS — J9620 Acute and chronic respiratory failure, unspecified whether with hypoxia or hypercapnia: Principal | ICD-10-CM

## 2013-08-16 DIAGNOSIS — Z7902 Long term (current) use of antithrombotics/antiplatelets: Secondary | ICD-10-CM

## 2013-08-16 DIAGNOSIS — R6521 Severe sepsis with septic shock: Secondary | ICD-10-CM

## 2013-08-16 DIAGNOSIS — R590 Localized enlarged lymph nodes: Secondary | ICD-10-CM

## 2013-08-16 DIAGNOSIS — Z66 Do not resuscitate: Secondary | ICD-10-CM | POA: Diagnosis not present

## 2013-08-16 DIAGNOSIS — R570 Cardiogenic shock: Secondary | ICD-10-CM | POA: Diagnosis not present

## 2013-08-16 DIAGNOSIS — A0472 Enterocolitis due to Clostridium difficile, not specified as recurrent: Secondary | ICD-10-CM | POA: Diagnosis not present

## 2013-08-16 DIAGNOSIS — K7200 Acute and subacute hepatic failure without coma: Secondary | ICD-10-CM | POA: Diagnosis not present

## 2013-08-16 DIAGNOSIS — F329 Major depressive disorder, single episode, unspecified: Secondary | ICD-10-CM | POA: Diagnosis present

## 2013-08-16 DIAGNOSIS — I739 Peripheral vascular disease, unspecified: Secondary | ICD-10-CM | POA: Diagnosis present

## 2013-08-16 DIAGNOSIS — Z515 Encounter for palliative care: Secondary | ICD-10-CM

## 2013-08-16 DIAGNOSIS — Z79899 Other long term (current) drug therapy: Secondary | ICD-10-CM

## 2013-08-16 DIAGNOSIS — N179 Acute kidney failure, unspecified: Secondary | ICD-10-CM

## 2013-08-16 DIAGNOSIS — A419 Sepsis, unspecified organism: Secondary | ICD-10-CM | POA: Diagnosis not present

## 2013-08-16 DIAGNOSIS — E875 Hyperkalemia: Secondary | ICD-10-CM | POA: Diagnosis not present

## 2013-08-16 DIAGNOSIS — I509 Heart failure, unspecified: Secondary | ICD-10-CM | POA: Diagnosis not present

## 2013-08-16 DIAGNOSIS — I4891 Unspecified atrial fibrillation: Secondary | ICD-10-CM

## 2013-08-16 DIAGNOSIS — R0789 Other chest pain: Secondary | ICD-10-CM | POA: Diagnosis present

## 2013-08-16 DIAGNOSIS — R58 Hemorrhage, not elsewhere classified: Secondary | ICD-10-CM | POA: Diagnosis not present

## 2013-08-16 DIAGNOSIS — F3289 Other specified depressive episodes: Secondary | ICD-10-CM | POA: Diagnosis present

## 2013-08-16 DIAGNOSIS — D649 Anemia, unspecified: Secondary | ICD-10-CM | POA: Diagnosis present

## 2013-08-16 LAB — CBC WITH DIFFERENTIAL/PLATELET
Basophils Absolute: 0 10*3/uL (ref 0.0–0.1)
Basophils Relative: 0 % (ref 0–1)
Eosinophils Absolute: 0.1 10*3/uL (ref 0.0–0.7)
Eosinophils Relative: 1 % (ref 0–5)
HEMATOCRIT: 38 % — AB (ref 39.0–52.0)
Hemoglobin: 12.7 g/dL — ABNORMAL LOW (ref 13.0–17.0)
LYMPHS ABS: 5.9 10*3/uL — AB (ref 0.7–4.0)
Lymphocytes Relative: 73 % — ABNORMAL HIGH (ref 12–46)
MCH: 30.7 pg (ref 26.0–34.0)
MCHC: 33.4 g/dL (ref 30.0–36.0)
MCV: 91.8 fL (ref 78.0–100.0)
MONO ABS: 0.1 10*3/uL (ref 0.1–1.0)
MONOS PCT: 2 % — AB (ref 3–12)
Neutro Abs: 2 10*3/uL (ref 1.7–7.7)
Neutrophils Relative %: 25 % — ABNORMAL LOW (ref 43–77)
Platelets: 100 10*3/uL — ABNORMAL LOW (ref 150–400)
RBC: 4.14 MIL/uL — AB (ref 4.22–5.81)
RDW: 15.1 % (ref 11.5–15.5)
WBC: 8.1 10*3/uL (ref 4.0–10.5)

## 2013-08-16 LAB — CG4 I-STAT (LACTIC ACID): Lactic Acid, Venous: 2.05 mmol/L (ref 0.5–2.2)

## 2013-08-16 LAB — BASIC METABOLIC PANEL
BUN: 23 mg/dL (ref 6–23)
CHLORIDE: 103 meq/L (ref 96–112)
CO2: 22 meq/L (ref 19–32)
Calcium: 8.6 mg/dL (ref 8.4–10.5)
Creatinine, Ser: 0.79 mg/dL (ref 0.50–1.35)
GFR calc Af Amer: >90 mL/min (ref >90)
GFR calc non Af Amer: 87 mL/min — ABNORMAL LOW (ref >90)
Glucose, Bld: 117 mg/dL — ABNORMAL HIGH (ref 70–99)
Potassium: 4.2 mEq/L (ref 3.7–5.3)
Sodium: 138 mEq/L (ref 137–147)

## 2013-08-16 LAB — D-DIMER, QUANTITATIVE (NOT AT ARMC): D DIMER QUANT: 2.92 ug{FEU}/mL — AB (ref 0.00–0.48)

## 2013-08-16 LAB — POCT I-STAT TROPONIN I: TROPONIN I, POC: 0.03 ng/mL (ref 0.00–0.08)

## 2013-08-16 MED ORDER — PANTOPRAZOLE SODIUM 40 MG PO TBEC
40.0000 mg | DELAYED_RELEASE_TABLET | Freq: Every day | ORAL | Status: DC
  Administered 2013-08-17 – 2013-08-18 (×2): 40 mg via ORAL
  Filled 2013-08-16 (×2): qty 1

## 2013-08-16 MED ORDER — LEVALBUTEROL HCL 0.63 MG/3ML IN NEBU: 0.6300 mg | INHALATION_SOLUTION | RESPIRATORY_TRACT | Status: DC | PRN

## 2013-08-16 MED ORDER — LEVALBUTEROL HCL 1.25 MG/0.5ML IN NEBU
1.2500 mg | INHALATION_SOLUTION | RESPIRATORY_TRACT | Status: DC | PRN
  Filled 2013-08-16: qty 0.5

## 2013-08-16 MED ORDER — SODIUM CHLORIDE 0.9 % IV SOLN: 250.0000 mL | INTRAVENOUS | Status: DC | PRN

## 2013-08-16 MED ORDER — CHLORHEXIDINE GLUCONATE 0.12 % MT SOLN
15.0000 mL | Freq: Two times a day (BID) | OROMUCOSAL | Status: DC
  Administered 2013-08-16 – 2013-08-29 (×26): 15 mL via OROMUCOSAL
  Filled 2013-08-16 (×28): qty 15

## 2013-08-16 MED ORDER — SODIUM CHLORIDE 0.9 % IV BOLUS (SEPSIS)
500.0000 mL | Freq: Once | INTRAVENOUS | Status: AC
  Administered 2013-08-16: 500 mL via INTRAVENOUS

## 2013-08-16 MED ORDER — SODIUM CHLORIDE 0.9 % IV BOLUS (SEPSIS)
1000.0000 mL | Freq: Once | INTRAVENOUS | Status: AC
  Administered 2013-08-16: 1000 mL via INTRAVENOUS

## 2013-08-16 MED ORDER — LEVALBUTEROL HCL 0.63 MG/3ML IN NEBU
0.6300 mg | INHALATION_SOLUTION | RESPIRATORY_TRACT | Status: DC | PRN
  Administered 2013-08-16: 0.63 mg via RESPIRATORY_TRACT
  Filled 2013-08-16: qty 3

## 2013-08-16 MED ORDER — ALBUTEROL (5 MG/ML) CONTINUOUS INHALATION SOLN
15.0000 mg/h | INHALATION_SOLUTION | Freq: Once | RESPIRATORY_TRACT | Status: AC
  Administered 2013-08-16: 15 mg/h via RESPIRATORY_TRACT
  Filled 2013-08-16: qty 20

## 2013-08-16 MED ORDER — BUDESONIDE 0.25 MG/2ML IN SUSP
0.2500 mg | Freq: Four times a day (QID) | RESPIRATORY_TRACT | Status: DC
  Administered 2013-08-16 – 2013-08-27 (×42): 0.25 mg via RESPIRATORY_TRACT
  Filled 2013-08-16 (×60): qty 2

## 2013-08-16 MED ORDER — BIOTENE DRY MOUTH MT LIQD
15.0000 mL | Freq: Two times a day (BID) | OROMUCOSAL | Status: DC
  Administered 2013-08-17 – 2013-08-29 (×25): 15 mL via OROMUCOSAL

## 2013-08-16 MED ORDER — METHYLPREDNISOLONE SODIUM SUCC 125 MG IJ SOLR
80.0000 mg | Freq: Two times a day (BID) | INTRAMUSCULAR | Status: DC
  Administered 2013-08-16 – 2013-08-18 (×5): 80 mg via INTRAVENOUS
  Filled 2013-08-16 (×3): qty 1.28
  Filled 2013-08-16: qty 2
  Filled 2013-08-16 (×2): qty 1.28

## 2013-08-16 MED ORDER — PREDNISONE 20 MG PO TABS
60.0000 mg | ORAL_TABLET | Freq: Once | ORAL | Status: AC
Start: 2013-08-16 — End: 2013-08-16
  Administered 2013-08-16: 60 mg via ORAL
  Filled 2013-08-16: qty 3

## 2013-08-16 MED ORDER — CLOPIDOGREL BISULFATE 75 MG PO TABS
75.0000 mg | ORAL_TABLET | Freq: Every day | ORAL | Status: DC
  Administered 2013-08-16 – 2013-08-23 (×7): 75 mg via ORAL
  Filled 2013-08-16 (×11): qty 1

## 2013-08-16 MED ORDER — IOHEXOL 350 MG/ML SOLN
100.0000 mL | Freq: Once | INTRAVENOUS | Status: AC | PRN
  Administered 2013-08-16: 100 mL via INTRAVENOUS

## 2013-08-16 MED ORDER — DEXTROSE 5 % IV SOLN
5.0000 mg/h | INTRAVENOUS | Status: DC
  Administered 2013-08-16 – 2013-08-18 (×3): 5 mg/h via INTRAVENOUS
  Filled 2013-08-16: qty 100

## 2013-08-16 MED ORDER — LORAZEPAM 2 MG/ML IJ SOLN
0.5000 mg | INTRAMUSCULAR | Status: DC | PRN
  Administered 2013-08-16: 1 mg via INTRAVENOUS
  Administered 2013-08-27: 0.5 mg via INTRAVENOUS
  Filled 2013-08-16 (×2): qty 1

## 2013-08-16 MED ORDER — LEVOFLOXACIN IN D5W 750 MG/150ML IV SOLN
750.0000 mg | Freq: Once | INTRAVENOUS | Status: AC
  Administered 2013-08-16: 750 mg via INTRAVENOUS
  Filled 2013-08-16: qty 150

## 2013-08-16 MED ORDER — MAGNESIUM SULFATE IN D5W 10-5 MG/ML-% IV SOLN
1.0000 g | Freq: Once | INTRAVENOUS | Status: DC
  Filled 2013-08-16: qty 100

## 2013-08-16 MED ORDER — LEVALBUTEROL HCL 0.63 MG/3ML IN NEBU
0.6300 mg | INHALATION_SOLUTION | Freq: Four times a day (QID) | RESPIRATORY_TRACT | Status: DC
  Administered 2013-08-16 – 2013-08-27 (×44): 0.63 mg via RESPIRATORY_TRACT
  Filled 2013-08-16 (×83): qty 3

## 2013-08-16 MED ORDER — BUPROPION HCL ER (SR) 150 MG PO TB12
150.0000 mg | ORAL_TABLET | Freq: Two times a day (BID) | ORAL | Status: DC
  Administered 2013-08-16 – 2013-08-18 (×4): 150 mg via ORAL
  Filled 2013-08-16 (×5): qty 1

## 2013-08-16 MED ORDER — DIPHENHYDRAMINE HCL 50 MG/ML IJ SOLN
INTRAMUSCULAR | Status: AC
  Filled 2013-08-16: qty 1

## 2013-08-16 MED ORDER — IPRATROPIUM BROMIDE 0.02 % IN SOLN
0.5000 mg | Freq: Four times a day (QID) | RESPIRATORY_TRACT | Status: DC
  Administered 2013-08-16 – 2013-08-27 (×44): 0.5 mg via RESPIRATORY_TRACT
  Filled 2013-08-16 (×44): qty 2.5

## 2013-08-16 MED ORDER — ASPIRIN 81 MG PO CHEW
81.0000 mg | CHEWABLE_TABLET | Freq: Every day | ORAL | Status: DC
  Administered 2013-08-16 – 2013-08-23 (×8): 81 mg via ORAL
  Filled 2013-08-16 (×9): qty 1

## 2013-08-16 MED ORDER — EPINEPHRINE 0.3 MG/0.3ML IJ SOAJ
INTRAMUSCULAR | Status: AC
  Filled 2013-08-16: qty 0.3

## 2013-08-16 MED ORDER — ASPIRIN 81 MG PO CHEW
324.0000 mg | CHEWABLE_TABLET | Freq: Once | ORAL | Status: AC
  Administered 2013-08-16: 324 mg via ORAL
  Filled 2013-08-16: qty 4

## 2013-08-16 MED ORDER — DOXYCYCLINE HYCLATE 100 MG PO TABS
100.0000 mg | ORAL_TABLET | Freq: Two times a day (BID) | ORAL | Status: DC
  Administered 2013-08-16 – 2013-08-18 (×4): 100 mg via ORAL
  Filled 2013-08-16 (×5): qty 1

## 2013-08-16 MED ORDER — IPRATROPIUM BROMIDE 0.02 % IN SOLN
1.0000 mg | Freq: Once | RESPIRATORY_TRACT | Status: AC
  Administered 2013-08-16: 1 mg via RESPIRATORY_TRACT
  Filled 2013-08-16: qty 5

## 2013-08-16 MED ORDER — MAGNESIUM SULFATE 50 % IJ SOLN: 1.0000 g | Freq: Once | INTRAMUSCULAR | Status: DC

## 2013-08-16 MED ORDER — DILTIAZEM LOAD VIA INFUSION
15.0000 mg | Freq: Once | INTRAVENOUS | Status: AC
  Administered 2013-08-16: 15 mg via INTRAVENOUS
  Filled 2013-08-16: qty 15

## 2013-08-16 MED ORDER — ENOXAPARIN SODIUM 40 MG/0.4ML ~~LOC~~ SOLN
40.0000 mg | SUBCUTANEOUS | Status: DC
  Administered 2013-08-16 – 2013-08-18 (×3): 40 mg via SUBCUTANEOUS
  Filled 2013-08-16 (×3): qty 0.4

## 2013-08-16 NOTE — Progress Notes (Signed)
[facility] Progress Note Patient Name: [name] DOB: 14-Sep-1940 MRN: [id number]  Date of Service  08/16/2013   HPI/Events of Note   anxiety  eICU Interventions  Prn ativan   Intervention Category Minor Interventions: Agitation / anxiety - evaluation and management  MCQUAID, DOUGLAS 08/16/2013, 11:16 PM

## 2013-08-16 NOTE — H&P (Addendum)
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  Postop changes right upper lobectomy  Interval increased mediastinal adenopathy. Largest lymph nodes in the left prevascular/ AP window space and the right paratracheal region.  Small chronic residual right subpulmonic effusion with small loculated components and pleural thickening all appearing chronic.  Centrilobular emphysema.   LINES / TUBES:   CULTURES: Blood 1/27 >>   ANTIBIOTICS: Doxycycline (chronic)  HISTORY OF PRESENT ILLNESS:  84 M with COPD, hx of lung ca, PVD, current smoker presented to [facility] ED with 2 wks of progressive dyspnea and chest tightness minimally and transiently responsive to albuterol MDI. Denies fever, purulent sputum, pleurodynia, hemoptysis, LE edema, calf tenderness. Found to be in AF with RVR. He has no known prior hx of AF. He is mosestly better after nebulized BDs in the ED.   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 Authorizing Provider  albuterol (PROVENTIL HFA;VENTOLIN HFA) 108 (90 BASE) MCG/ACT inhaler Inhale 2 puffs into the lungs 2 (two) times daily as needed for wheezing.    Yes Historical Provider, MD  aspirin EC 81 MG tablet Take 81 mg by mouth daily.   Yes Historical Provider, MD  buPROPion (WELLBUTRIN SR) 150 MG 12 hr  tablet Take 150 mg by mouth daily.    Yes Historical Provider, MD  clopidogrel (PLAVIX) 75 MG tablet Take 75 mg by mouth daily.   Yes Historical Provider, MD  dicyclomine (BENTYL) 10 MG capsule Take 10 mg by mouth 4 (four) times daily -  before meals and at bedtime.   Yes Historical Provider, MD  doxycycline (ADOXA) 50 MG tablet Take 50 mg by mouth 2 (two) times daily.   Yes Historical Provider, MD  naproxen sodium (ANAPROX) 220 MG tablet Take 440 mg by mouth as needed (pain.).   Yes Historical Provider, MD  pantoprazole (PROTONIX) 20 MG tablet Take 20 mg by mouth daily.   Yes Historical Provider, MD  simvastatin (ZOCOR) 40 MG tablet Take 40 mg by mouth every evening.   Yes Historical Provider, MD  tiotropium (SPIRIVA) 18 MCG inhalation capsule Place 18 mcg into inhaler and inhale every morning.   Yes Historical Provider, MD   Allergies  Allergen Reactions  . Lactose Intolerance (Gi)   . Shellfish Allergy Diarrhea and Nausea And Vomiting  . Strawberry Rash    FAMILY HISTORY:  History reviewed. No pertinent family history.  SOCIAL HISTORY: No significant occupational exposures Has smoked all adult life up to 2 ppd Presently smokes approx 5 cigs per day No hx of EtOH abuse NO hx of illicit substance abuse   REVIEW OF SYSTEMS:   As above. Also denies unexplained wt loss, N/V/D, dysuria  SUBJECTIVE:   VITAL SIGNS: Temp:  [97.1 F (36.2 C)-97.7 F (36.5 C)] 97.1 F (36.2 C) (01/27 2130) Pulse Rate:  [  30-155] 30 (01/27 2130) Resp:  [20-31] 31 (01/27 2130) BP: (102-140)/(52-123) 118/97 mmHg (01/27 2130) SpO2:  [96 %-100 %] 100 % (01/27 2159) FiO2 (%):  [70 %] 70 % (01/27 2159) Weight:  [63.2 kg (139 lb 5.3 oz)] 63.2 kg (139 lb 5.3 oz) (01/27 2130) HEMODYNAMICS:   VENTILATOR SETTINGS: Vent Mode:  [-]  FiO2 (%):  [70 %] 70 % INTAKE / OUTPUT: Intake/Output   Daniel     PHYSICAL EXAMINATION: General:  Mildly dyspneic @ rest Neuro:  Cognition intact, no focal deficits HEENT:   EOMI, PERRL Cardiovascular: IRIR, tachy, no M Lungs: diffuse coarse rhonchi, no true wheezes noted Abdomen: Soft, NT, NABS Ext: warm, no edema, diminished DP pulses  LABS:  CBC  Recent Labs Lab 08/16/13 1630  WBC 8.1  HGB 12.7*  HCT 38.0*  PLT 100*   Coag's No results found for this basename: APTT, INR,  in the last 168 hours BMET  Recent Labs Lab 08/16/13 1630  NA 138  K 4.2  CL 103  CO2 22  BUN 23  CREATININE 0.79  GLUCOSE 117*   Electrolytes  Recent Labs Lab 08/16/13 1630  CALCIUM 8.6   Sepsis Markers  Recent Labs Lab 08/16/13 1953  LATICACIDVEN 2.05   ABG No results found for this basename: PHART, PCO2ART, PO2ART,  in the last 168 hours Liver Enzymes No results found for this basename: AST, ALT, ALKPHOS, BILITOT, ALBUMIN,  in the last 168 hours Cardiac Enzymes No results found for this basename: TROPONINI, PROBNP,  in the last 168 hours Glucose No results found for this basename: GLUCAP,  in the last 168 hours  Imaging  CT chest reviewed: Negative for significant acute pulmonary embolus.  Postop changes right upper lobectomy  Interval increased mediastinal adenopathy, index measurements as above. Largest lymph nodes in the left prevascular/ AP window space and the right paratracheal region.  Small chronic residual right subpulmonic effusion with small loculated components and pleural thickening all appearing chronic.  Centrilobular emphysema  No superimposed definite pneumonia or edema      CXR:  Emphysema, chronic R basilar changes - volume loss and/or effusion  ASSESSMENT / PLAN:  PULMONARY A:  Acute on chronic respiratory failure AECOPD Hx of lung cancer s/p resection 2009 Increased mediatinal LAN, nonspecific P:   Supplemental O2 Nebulized steroids and bronchodilators Systemic steroids PRN BiPAP  CARDIOVASCULAR A:  Nonspecific chest pain PRWP on EKG New onset AFRVR Peripheral vascular disease P:  Diltiazem infusion to  maintain HR 70-115 R/O MI Echo 1/28 ordered Continue ASA, clopidogrel  No heparin infusion due to thrombocytopenia Consider cards eval 1/28  RENAL A:  No issues P:   Monitor BMET intermittently Correct electrolytes as indicated  GASTROINTESTINAL A:  Chronic PPI use P:   SUP: Cont home dose of pantoprazole  HEMATOLOGIC A:  Thrombocytopenia, unknown chronicity P:  Monitor CBC intermittently  INFECTIOUS A:   Chronic bypass graft infection P:   Micro and abx as above Continue home dose of doxy  ENDOCRINE A: No issues P:   CBGs while on systemic steroids SSI for glu > 180  NEUROLOGIC A:  Mild depression P:   Cont bupropion  TODAY'S SUMMARY: Admit to SDU   I have personally obtained a history, examined the patient, evaluated laboratory and imaging results, formulated the assessment and plan and placed orders. CRITICAL CARE: The patient is critically ill with multiple organ systems failure and requires high complexity decision making for assessment and support, frequent evaluation and titration  of therapies, application of advanced monitoring technologies and extensive interpretation of multiple databases. Critical Care Time devoted to patient care services described in this note is  minutes.   Merton Border, MD ; [facility] [phone].  After 5:30 PM or weekends, call [phone]  Pulmonary and [place] Pager: [phone]  08/16/2013, 10:13 PM

## 2013-08-16 NOTE — ED Provider Notes (Signed)
CSN: [id number]     Arrival date & time 08/16/13  1547 History   First MD Initiated Contact with Patient 08/16/13 [phone]     Chief Complaint  Patient presents with  . Chest Pain    chest tightness with cough  . Shortness of Breath    1 week hx of increased shortness of breath   (Consider location/radiation/quality/duration/timing/severity/associated sxs/prior Treatment) HPI Comments: 73 year old male with a history of lung cancer with partial lung resection 5 years ago presents with shortness of breath over the past 2 weeks. His been progressive. The patient states that he's also had a cough with clear sputum. Has not had any fevers or congestion. No sore throat or myalgias. He does get some chest pain near his collar bone he feels is worse with coughing. She is about 2/10 at this time. Patient states initially his albuterol and screen were helping over the past couple days has had no effect. He's never had a blood clot before. His had no leg pain or leg swelling. No hemoptysis.   Past Medical History  Diagnosis Date  . Coronary artery disease   . Cancer     lung   Past Surgical History  Procedure Laterality Date  . Cataract extraction    . Venous bypass    . Lung removal, partial     No family history on file. History  Substance Use Topics  . Smoking status: Current Every Day Smoker -- 0.50 packs/day    Types: Cigarettes  . Smokeless tobacco: Not on file  . Alcohol Use: Yes     Comment: social    Review of Systems  Constitutional: Negative for fever and chills.  HENT: Negative for congestion.   Respiratory: Positive for cough and shortness of breath.   Cardiovascular: Positive for chest pain. Negative for leg swelling.  Gastrointestinal: Negative for vomiting and abdominal pain.  All other systems reviewed and are negative.    Allergies  Lactose intolerance (gi); Shellfish allergy; and Strawberry  Home Medications   Current Outpatient Rx  Name  Route  Sig  Dispense   Refill  . albuterol (PROVENTIL HFA;VENTOLIN HFA) 108 (90 BASE) MCG/ACT inhaler   Inhalation   Inhale 2 puffs into the lungs every 6 (six) hours as needed for wheezing.         Marland Kitchen aspirin EC 81 MG tablet   Oral   Take 81 mg by mouth daily.         Marland Kitchen buPROPion (WELLBUTRIN SR) 150 MG 12 hr tablet   Oral   Take 150 mg by mouth 2 (two) times daily.         . citalopram (CELEXA) 40 MG tablet   Oral   Take 40 mg by mouth daily.         . clopidogrel (PLAVIX) 75 MG tablet   Oral   Take 75 mg by mouth daily.         Marland Kitchen doxycycline (ADOXA) 50 MG tablet   Oral   Take 50 mg by mouth 2 (two) times daily.         Marland Kitchen oxyCODONE (OXY IR/ROXICODONE) 5 MG immediate release tablet   Oral   Take 5 mg by mouth every 6 (six) hours as needed for pain.         . pantoprazole (PROTONIX) 20 MG tablet   Oral   Take 20 mg by mouth daily.         . simvastatin (ZOCOR) 40 MG tablet  Oral   Take 40 mg by mouth every evening.         . tiotropium (SPIRIVA) 18 MCG inhalation capsule   Inhalation   Place 18 mcg into inhaler and inhale every morning.          BP 108/52  Pulse 118  Temp(Src) 97.7 F (36.5 C) (Oral)  Resp 20  SpO2 98% Physical Exam  Nursing note and vitals reviewed. Constitutional: He is oriented to person, place, and time. He appears well-developed and well-nourished.  HENT:  Head: Normocephalic and atraumatic.  Right Ear: External ear normal.  Left Ear: External ear normal.  Nose: Nose normal.  Eyes: Right eye exhibits no discharge. Left eye exhibits no discharge.  Neck: Neck supple.  Cardiovascular: Regular rhythm, normal heart sounds and intact distal pulses.  Tachycardia present.   Pulmonary/Chest: Effort normal and breath sounds normal. He has no wheezes.  Abdominal: Soft. He exhibits no distension. There is no tenderness.  Musculoskeletal: He exhibits no edema and no tenderness.  Neurological: He is alert and oriented to person, place, and time.   Skin: Skin is warm and dry.    ED Course  Procedures (including critical care time) Labs Review Labs Reviewed  CBC WITH DIFFERENTIAL - Abnormal; Notable for the following:    RBC 4.14 (*)    Hemoglobin 12.7 (*)    HCT 38.0 (*)    Platelets 100 (*)    Neutrophils Relative % 25 (*)    Lymphocytes Relative 73 (*)    Lymphs Abs 5.9 (*)    Monocytes Relative 2 (*)    All other components within normal limits  BASIC METABOLIC PANEL - Abnormal; Notable for the following:    Glucose, Bld 117 (*)    GFR calc non Af Amer 87 (*)    All other components within normal limits  D-DIMER, QUANTITATIVE - Abnormal; Notable for the following:    D-Dimer, Quant 2.92 (*)    All other components within normal limits  CULTURE, BLOOD (ROUTINE X 2)  MRSA PCR SCREENING  CBC  BASIC METABOLIC PANEL  TROPONIN I  PRO B NATRIURETIC PEPTIDE  CG4 I-STAT (LACTIC ACID)  POCT I-STAT TROPONIN I   Imaging Review Dg Chest 2 View  08/16/2013   CLINICAL DATA:  Chest pain.  Cough.  Short of breath.  EXAM: CHEST  2 VIEW  COMPARISON:  03/30/2008  FINDINGS: Moderate cardiomegaly. Stable right pleural effusion. No pneumothorax. The previously described loculated hydro pneumothorax is no longer visualized. Pulmonary opacities at the right pacer worrisome for airspace disease.  IMPRESSION: Right pleural effusion and basilar pulmonary opacities.   Electronically Signed   By: Maryclare Bean M.D.   On: 08/16/2013 17:07   Ct Angio Chest Pe W/cm &/or Wo Cm  08/16/2013   CLINICAL DATA:  Shortness of breath, elevated D-dimer, chest pain, prior lung cancer, status post right upper lobectomy  EXAM: CT ANGIOGRAPHY CHEST WITH CONTRAST  TECHNIQUE: Multidetector CT imaging of the chest was performed using the standard protocol during bolus administration of intravenous contrast. Multiplanar CT image reconstructions including MIPs were obtained to evaluate the vascular anatomy.  CONTRAST:  167mL OMNIPAQUE IOHEXOL 350 MG/ML SOLN  COMPARISON:   03/26/2008, 03/27/2008  FINDINGS: Pulmonary arteries appear patent. No significant filling defect or pulmonary embolus demonstrated by CTA. Atherosclerotic changes of the aorta. Thoracic aorta is not opacified because of the phase of imaging. Coronal calcifications noted. Normal heart size. No pericardial effusion. Chronic residual small right base effusion with small  subpulmonic loculated components. Increased mediastinal adenopathy diffusely. Prevascular and AP window lymph nodes are enlarged, index measurement 28 x 17 mm, image 36. Right paratracheal adenopathy measures 18 x 27 mm. Postop changes from right upper lobectomy.  Included upper abdomen demonstrates incidental small gallstones. Celiac and SMA vascular stents noted. Degenerative changes of the spine. No acute osseous finding.  Lung windows demonstrate postop changes from right upper lobectomy. Background centrilobular emphysema throughout both lungs. Scattered areas of parenchymal scarring along the right fissure noted. Right base bandlike density is also compatible with scarring from the chronic residual small right effusion. Lung windows are limited with motion artifact. Scattered areas of pleural thickening on the right from prior surgery and chest tube. Minor left base atelectasis as well. No definite superimposed airspace process or pneumonia.  Review of the MIP images confirms the above findings.  IMPRESSION: Negative for significant acute pulmonary embolus.  Postop changes right upper lobectomy  Interval increased mediastinal adenopathy, index measurements as above. Largest lymph nodes in the left prevascular/ AP window space and the right paratracheal region.  Small chronic residual right subpulmonic effusion with small loculated components and pleural thickening all appearing chronic.  Centrilobular emphysema  No superimposed definite pneumonia or edema   Electronically Signed   By: Daryll Brod M.D.   On: 08/16/2013 18:20    EKG  Interpretation    Date/Time:  Tuesday August 16 2013 62:70:35 EST Ventricular Rate:  123 PR Interval:    QRS Duration: 89 QT Interval:  367 QTC Calculation: 525 R Axis:   112 Text Interpretation:  Atrial fibrillation Ventricular premature complex Anterior infarct, old Borderline T abnormalities, inferior leads Prolonged QT interval afib is new Confirmed by Shanterica Biehler  MD, Akash Winski ([id number]) on 08/16/2013 4:30:36 PM           CRITICAL CARE Performed by: Sherwood Gambler T   Total critical care time: 30 minutes  Critical care time was exclusive of separately billable procedures and treating other patients.  Critical care was necessary to treat or prevent imminent or life-threatening deterioration.  Critical care was time spent personally by me on the following activities: development of treatment plan with patient and/or surrogate as well as nursing, discussions with consultants, evaluation of patient's response to treatment, examination of patient, obtaining history from patient or surrogate, ordering and performing treatments and interventions, ordering and review of laboratory studies, ordering and review of radiographic studies, pulse oximetry and re-evaluation of patient's condition.  MDM   1. Acute on chronic respiratory failure   2. Acute exacerbation of chronic obstructive pulmonary disease (COPD)   3. Atrial fibrillation with RVR   4. New onset atrial fibrillation    Patient initially appears well but mildly tachypneic. No wheezing on initial presentation, so PE workup performed. No PE or obvious PNA. No signs of fluid overload. He does appear to have afib w/ RVR. After xopenex, patient's airways did seem to open up some. He intermittently had severe respiratory distress, unk if this was caused by afib, lung pathology or acute anxiety. Given oxygen and breathing treatments seemed to help. These would then quickly resolve. Remained alert throughout ED stay. Given this, however, I  feel he needs close monitoring, pulmonology will admit to stepdown. Given his infectious type symptoms with COPD exacerbation will cover with levaquin.    Ephraim Hamburger, MD 08/16/13 [phone]

## 2013-08-16 NOTE — ED Notes (Signed)
Pt reports pain in midsternal area with cough over last week. Pain radiating to l/side of chest. C/o shortness of breath with increased activity x 1 week

## 2013-08-16 NOTE — Progress Notes (Signed)
Dentures remain in place. Despite education provided to the patient, he continues to refuse to have his dentures removed. He is tolerating the BiPAP well at this time with a full face mask, and prefers its continued use, although it is ordered on an as needed basis (per Dr. Leonidas Romberg). Jettie Pagan, RN aware of remaining dentures.

## 2013-08-17 ENCOUNTER — Inpatient Hospital Stay (HOSPITAL_COMMUNITY): Payer: Medicare Other

## 2013-08-17 DIAGNOSIS — J9600 Acute respiratory failure, unspecified whether with hypoxia or hypercapnia: Secondary | ICD-10-CM

## 2013-08-17 DIAGNOSIS — I739 Peripheral vascular disease, unspecified: Secondary | ICD-10-CM

## 2013-08-17 DIAGNOSIS — R599 Enlarged lymph nodes, unspecified: Secondary | ICD-10-CM

## 2013-08-17 DIAGNOSIS — I059 Rheumatic mitral valve disease, unspecified: Secondary | ICD-10-CM

## 2013-08-17 LAB — LIPID PANEL
CHOL/HDL RATIO: 3.9 ratio
Cholesterol: 125 mg/dL (ref 0–200)
HDL: 32 mg/dL — ABNORMAL LOW (ref >39)
LDL Cholesterol: 85 mg/dL (ref 0–99)
Triglycerides: 42 mg/dL (ref <150)
VLDL: 8 mg/dL (ref 0–40)

## 2013-08-17 LAB — BLOOD GAS, ARTERIAL
ACID-BASE DEFICIT: 5.2 mmol/L — AB (ref 0.0–2.0)
ACID-BASE DEFICIT: 5.8 mmol/L — AB (ref 0.0–2.0)
BICARBONATE: 18.2 meq/L — AB (ref 20.0–24.0)
BICARBONATE: 18.5 meq/L — AB (ref 20.0–24.0)
Drawn by: 295031
Drawn by: 295031
FIO2: 1 %
MECHVT: 500 mL
O2 Content: 2 L/min
O2 SAT: 97.1 %
O2 Saturation: 99.5 %
PATIENT TEMPERATURE: 98.6
PEEP: 5 cmH2O
PO2 ART: 322 mmHg — AB (ref 80.0–100.0)
Patient temperature: 98.6
RATE: 14 resp/min
TCO2: 16.6 mmol/L (ref 0–100)
TCO2: 16.9 mmol/L (ref 0–100)
pCO2 arterial: 30.5 mmHg — ABNORMAL LOW (ref 35.0–45.0)
pCO2 arterial: 34.2 mmHg — ABNORMAL LOW (ref 35.0–45.0)
pH, Arterial: 7.394 (ref 7.350–7.450)
pH, Arterial: 7.353 (ref 7.350–7.450)
pO2, Arterial: 89.6 mmHg (ref 80.0–100.0)

## 2013-08-17 LAB — CBC
HCT: 37.1 % — ABNORMAL LOW (ref 39.0–52.0)
HEMOGLOBIN: 11.8 g/dL — AB (ref 13.0–17.0)
MCH: 29.9 pg (ref 26.0–34.0)
MCHC: 31.8 g/dL (ref 30.0–36.0)
MCV: 94.2 fL (ref 78.0–100.0)
Platelets: 94 10*3/uL — ABNORMAL LOW (ref 150–400)
RBC: 3.94 MIL/uL — ABNORMAL LOW (ref 4.22–5.81)
RDW: 15.1 % (ref 11.5–15.5)
WBC: 5.3 10*3/uL (ref 4.0–10.5)

## 2013-08-17 LAB — TROPONIN I: Troponin I: <0.3 ng/mL (ref <0.30)

## 2013-08-17 LAB — BASIC METABOLIC PANEL
BUN: 17 mg/dL (ref 6–23)
CALCIUM: 7.5 mg/dL — AB (ref 8.4–10.5)
CO2: 17 mEq/L — ABNORMAL LOW (ref 19–32)
Chloride: 103 mEq/L (ref 96–112)
Creatinine, Ser: 0.7 mg/dL (ref 0.50–1.35)
GFR calc Af Amer: >90 mL/min (ref >90)
Glucose, Bld: 211 mg/dL — ABNORMAL HIGH (ref 70–99)
Potassium: 4.2 mEq/L (ref 3.7–5.3)
SODIUM: 136 meq/L — AB (ref 137–147)

## 2013-08-17 LAB — MRSA PCR SCREENING: MRSA by PCR: NEGATIVE

## 2013-08-17 LAB — INFLUENZA PANEL BY PCR (TYPE A & B)
H1N1 flu by pcr: NOT DETECTED
Influenza A By PCR: NEGATIVE
Influenza B By PCR: NEGATIVE

## 2013-08-17 LAB — PRO B NATRIURETIC PEPTIDE: PRO B NATRI PEPTIDE: 2840 pg/mL — AB (ref 0–125)

## 2013-08-17 MED ORDER — FENTANYL CITRATE 0.05 MG/ML IJ SOLN
50.0000 ug | INTRAMUSCULAR | Status: DC | PRN
  Administered 2013-08-17: 50 ug via INTRAVENOUS
  Filled 2013-08-17: qty 2

## 2013-08-17 MED ORDER — FUROSEMIDE 10 MG/ML IJ SOLN
20.0000 mg | Freq: Once | INTRAMUSCULAR | Status: AC
  Administered 2013-08-17: 20 mg via INTRAVENOUS
  Filled 2013-08-17: qty 2

## 2013-08-17 MED ORDER — SODIUM CHLORIDE 0.9 % IV BOLUS (SEPSIS)
500.0000 mL | Freq: Once | INTRAVENOUS | Status: AC
  Administered 2013-08-17: 500 mL via INTRAVENOUS

## 2013-08-17 MED ORDER — OSELTAMIVIR PHOSPHATE 6 MG/ML PO SUSR
75.0000 mg | Freq: Two times a day (BID) | ORAL | Status: DC
  Administered 2013-08-17 – 2013-08-18 (×2): 75 mg
  Filled 2013-08-17 (×3): qty 12.5

## 2013-08-17 MED ORDER — AMIODARONE HCL IN DEXTROSE 360-4.14 MG/200ML-% IV SOLN
30.0000 mg/h | INTRAVENOUS | Status: DC
  Administered 2013-08-17: 30 mg/h via INTRAVENOUS
  Filled 2013-08-17 (×2): qty 200

## 2013-08-17 MED ORDER — ROCURONIUM BROMIDE 50 MG/5ML IV SOLN
INTRAVENOUS | Status: AC
  Administered 2013-08-17: 6 mg
  Filled 2013-08-17: qty 2

## 2013-08-17 MED ORDER — FENTANYL CITRATE 0.05 MG/ML IJ SOLN
INTRAMUSCULAR | Status: AC
  Filled 2013-08-17: qty 4

## 2013-08-17 MED ORDER — VITAL AF 1.2 CAL PO LIQD
1000.0000 mL | ORAL | Status: DC
  Administered 2013-08-17: 1000 mL
  Filled 2013-08-17 (×3): qty 1000

## 2013-08-17 MED ORDER — SODIUM CHLORIDE 0.9 % IV SOLN
0.0000 ug/h | INTRAVENOUS | Status: DC
  Administered 2013-08-17: 50 ug/h via INTRAVENOUS
  Administered 2013-08-18 – 2013-08-19 (×2): 100 ug/h via INTRAVENOUS
  Administered 2013-08-20: 120 ug/h via INTRAVENOUS
  Administered 2013-08-22: 150 ug/h via INTRAVENOUS
  Administered 2013-08-23: 50 ug/h via INTRAVENOUS
  Administered 2013-08-24: 250 ug/h via INTRAVENOUS
  Administered 2013-08-25: 150 ug/h via INTRAVENOUS
  Administered 2013-08-25: 250 ug/h via INTRAVENOUS
  Administered 2013-08-26: 150 ug/h via INTRAVENOUS
  Filled 2013-08-17 (×12): qty 50

## 2013-08-17 MED ORDER — FENTANYL CITRATE 0.05 MG/ML IJ SOLN
50.0000 ug | Freq: Once | INTRAMUSCULAR | Status: AC
  Administered 2013-08-17: 50 ug via INTRAVENOUS
  Filled 2013-08-17: qty 2

## 2013-08-17 MED ORDER — AMIODARONE HCL IN DEXTROSE 360-4.14 MG/200ML-% IV SOLN
60.0000 mg/h | INTRAVENOUS | Status: AC
  Administered 2013-08-17 (×2): 60 mg/h via INTRAVENOUS
  Filled 2013-08-17 (×2): qty 200

## 2013-08-17 MED ORDER — OSELTAMIVIR PHOSPHATE 75 MG PO CAPS
75.0000 mg | ORAL_CAPSULE | Freq: Two times a day (BID) | ORAL | Status: DC
  Administered 2013-08-17: 75 mg via ORAL
  Filled 2013-08-17 (×2): qty 1

## 2013-08-17 MED ORDER — LIDOCAINE HCL (CARDIAC) 20 MG/ML IV SOLN
INTRAVENOUS | Status: AC
  Filled 2013-08-17: qty 5

## 2013-08-17 MED ORDER — PHENYLEPHRINE HCL 10 MG/ML IJ SOLN
30.0000 ug/min | INTRAVENOUS | Status: DC
  Administered 2013-08-18: 30 ug/min via INTRAVENOUS
  Administered 2013-08-18: 150 ug/min via INTRAVENOUS
  Administered 2013-08-18: 50 ug/min via INTRAVENOUS
  Filled 2013-08-17 (×3): qty 1

## 2013-08-17 MED ORDER — AMIODARONE LOAD VIA INFUSION
150.0000 mg | Freq: Once | INTRAVENOUS | Status: AC
  Administered 2013-08-17: 150 mg via INTRAVENOUS
  Filled 2013-08-17: qty 83.34

## 2013-08-17 MED ORDER — FENTANYL BOLUS VIA INFUSION
25.0000 ug | INTRAVENOUS | Status: DC | PRN
  Filled 2013-08-17: qty 50

## 2013-08-17 MED ORDER — SUCCINYLCHOLINE CHLORIDE 20 MG/ML IJ SOLN
INTRAMUSCULAR | Status: AC
  Filled 2013-08-17: qty 1

## 2013-08-17 MED ORDER — ETOMIDATE 2 MG/ML IV SOLN
INTRAVENOUS | Status: AC
  Administered 2013-08-17: 20 mg
  Filled 2013-08-17: qty 20

## 2013-08-17 MED ORDER — MIDAZOLAM HCL 2 MG/2ML IJ SOLN
INTRAMUSCULAR | Status: AC
  Administered 2013-08-17: 2 mg
  Filled 2013-08-17: qty 4

## 2013-08-17 MED ORDER — FENTANYL CITRATE 0.05 MG/ML IJ SOLN
50.0000 ug | INTRAMUSCULAR | Status: AC | PRN
  Administered 2013-08-17: 50 ug via INTRAVENOUS
  Administered 2013-08-17: 100 ug via INTRAVENOUS
  Administered 2013-08-17: 50 ug via INTRAVENOUS

## 2013-08-17 NOTE — Progress Notes (Signed)
  Amiodarone Drug - Drug Interaction Consult Note  Recommendations: Amiodarone is metabolized by the cytochrome P450 system and therefore has the potential to cause many drug interactions. Amiodarone has an average plasma half-life of 50 days (range 20 to 100 days).   There is potential for drug interactions to occur several weeks or months after stopping treatment and the onset of drug interactions may be slow after initiating amiodarone.   []  Statins: Increased risk of myopathy. Simvastatin- restrict dose to 20mg  daily. Other statins: counsel patients to report any muscle pain or weakness immediately.  []  Anticoagulants: Amiodarone can increase anticoagulant effect. Consider warfarin dose reduction. Patients should be monitored closely and the dose of anticoagulant altered accordingly, remembering that amiodarone levels take several weeks to stabilize.  []  Antiepileptics: Amiodarone can increase plasma concentration of phenytoin, the dose should be reduced. Note that small changes in phenytoin dose can result in large changes in levels. Monitor patient and counsel on signs of toxicity.  []  Beta blockers: increased risk of bradycardia, AV block and myocardial depression. Sotalol - avoid concomitant use.  [x]   Calcium channel blockers (diltiazem and verapamil): increased risk of bradycardia, AV block and myocardial depression.  []   Cyclosporine: Amiodarone increases levels of cyclosporine. Reduced dose of cyclosporine is recommended.  []  Digoxin dose should be halved when amiodarone is started.  []  Diuretics: increased risk of cardiotoxicity if hypokalemia occurs.  []  Oral hypoglycemic agents (glyburide, glipizide, glimepiride): increased risk of hypoglycemia. Patient's glucose levels should be monitored closely when initiating amiodarone therapy.   []  Drugs that prolong the QT interval:  Torsades de pointes risk may be increased with concurrent use - avoid if possible.  Monitor QTc, also  keep magnesium/potassium WNL if concurrent therapy can't be avoided. Marland Kitchen Antibiotics: e.g. fluoroquinolones, erythromycin. . Antiarrhythmics: e.g. quinidine, procainamide, disopyramide, sotalol. . Antipsychotics: e.g. phenothiazines, haloperidol.  . Lithium, tricyclic antidepressants, and methadone.   Thank Dennis Bast  Gretta Arab PharmD, BCPS Pager [phone] 08/17/2013 10:53 AM

## 2013-08-17 NOTE — Progress Notes (Signed)
Called to evaluated patient.  Patient is on BiPAP and has been deteriorating throughout the day.  RR in the high 30's on BiPAP.  Clearly states tiring out.  Based on that, decision was made to go ahead and intubate patient.  Will intubate, mechanically ventilate and follow ABG and CXR.  Will also place central line.  CC time 35 minutes.  Rush Farmer, M.D. [facility] Pulmonary/Critical Care Medicine. Pager: [phone]. After hours pager: [phone].

## 2013-08-17 NOTE — Significant Event (Signed)
Pt intubated earlier today, and started on PAD protocol phase 1.  Remains agitated with RASS +1.  Will change to PAD phase 2.  Chesley Mires, MD 08/17/2013, 5:44 PM

## 2013-08-17 NOTE — Consult Note (Signed)
Reason for Consult: atrial fibrillation  Requesting Physician: Alva Garnet  Cardiologist: None  HPI: This is a 73 y.o. male with a past medical history significant for COPD, peripheral arterial disease status post aortobifemoral bypass, status post partial lung resection for lung cancer in 2009 admitted for acute exacerbation of COPD with acute respiratory insufficiency. After admission to the hospital he developed atrial fibrillation rapid ventricular response. Diltiazem was administered intravenously but had to be stopped for hypotension. He remains in atrial fibrillation with a ventricular rate of around 130 beats per minute, but his blood pressure is now better on diltiazem 5 mg/h IV. He was receiving BiPAP overnight but is now receiving oxygen by nasal cannula.  Review of his records shows a very brief (10 minutes) episode of paroxysmal atrial fibrillation that occurred postoperatively when he was admitted for revision of his aortofemoral bypass in 2002  He was not aware that he had atrial fibrillation before and is currently unaware of the palpitations. His past medical history lists coronary disease but he is unaware of having coronary issues in the past. A CT of the chest which was performed yesterday does show coronary calcifications, but was not a study geared towards evaluation of the coronary system. There is evidence of stents in the celiac artery and superior mesenteric artery. He gets most of his medical care at the [facility] in [place].  He has moderate respiratory distress and a somewhat weak cough. He has difficulty bringing up what sounds like very tenacious sputum.  He was previously on warfarin, but it sounds like this was prescribed for his peripheral vascular problems rather than atrial arrhythmia.  PMHx:  Past Medical History  Diagnosis Date  . Coronary artery disease   . Cancer     lung   Past Surgical History  Procedure Laterality Date  . Cataract  extraction    . Venous bypass    . Lung removal, partial      FAMHx: History reviewed. No pertinent family history.  SOCHx:  reports that he has been smoking Cigarettes.  He has been smoking about 0.50 packs per day. He does not have any smokeless tobacco history on file. He reports that he drinks alcohol. He reports that he does not use illicit drugs.  ALLERGIES: Allergies  Allergen Reactions  . Lactose Intolerance (Gi)   . Shellfish Allergy Diarrhea and Nausea And Vomiting  . Strawberry Rash    ROS: He has moderate dyspnea at rest and some musculoskeletal chest soreness because of frequent coughing. He has difficulty bringing up thick secretions. He denies fever or chills. He has not had anginal chest discomfort. He is unaware of palpitations and denies syncope or lower extremity edema.  The patient specifically denies orthopnea, paroxysmal nocturnal dyspnea, syncope, palpitations, focal neurological deficits, intermittent claudication, lower extremity edema, unexplained weight gain, hemoptysis.  The patient also denies abdominal pain, nausea, vomiting, dysphagia, diarrhea, constipation, polyuria, polydipsia, dysuria, hematuria, frequency, urgency, abnormal bleeding or bruising, fever, chills, unexpected weight changes, mood swings, change in skin or hair texture, change in voice quality, auditory or visual problems, allergic reactions or rashes, new musculoskeletal complaints other than usual "aches and pains".   HOME MEDICATIONS: Prescriptions prior to admission  Medication Sig Dispense Refill  . albuterol (PROVENTIL HFA;VENTOLIN HFA) 108 (90 BASE) MCG/ACT inhaler Inhale 2 puffs into the lungs 2 (two) times daily as needed for wheezing.       Marland Kitchen aspirin EC 81 MG tablet Take 81 mg by mouth  daily.      . buPROPion (WELLBUTRIN SR) 150 MG 12 hr tablet Take 150 mg by mouth daily.       . clopidogrel (PLAVIX) 75 MG tablet Take 75 mg by mouth daily.      Marland Kitchen dicyclomine (BENTYL) 10 MG  capsule Take 10 mg by mouth 4 (four) times daily -  before meals and at bedtime.      Marland Kitchen doxycycline (ADOXA) 50 MG tablet Take 50 mg by mouth 2 (two) times daily.      . naproxen sodium (ANAPROX) 220 MG tablet Take 440 mg by mouth as needed (pain.).      Marland Kitchen pantoprazole (PROTONIX) 20 MG tablet Take 20 mg by mouth daily.      . simvastatin (ZOCOR) 40 MG tablet Take 40 mg by mouth every evening.      . tiotropium (SPIRIVA) 18 MCG inhalation capsule Place 18 mcg into inhaler and inhale every morning.        HOSPITAL MEDICATIONS: I have reviewed the patient's current medications. Prior to Admission:  Prescriptions prior to admission  Medication Sig Dispense Refill  . albuterol (PROVENTIL HFA;VENTOLIN HFA) 108 (90 BASE) MCG/ACT inhaler Inhale 2 puffs into the lungs 2 (two) times daily as needed for wheezing.       Marland Kitchen aspirin EC 81 MG tablet Take 81 mg by mouth daily.      Marland Kitchen buPROPion (WELLBUTRIN SR) 150 MG 12 hr tablet Take 150 mg by mouth daily.       . clopidogrel (PLAVIX) 75 MG tablet Take 75 mg by mouth daily.      Marland Kitchen dicyclomine (BENTYL) 10 MG capsule Take 10 mg by mouth 4 (four) times daily -  before meals and at bedtime.      Marland Kitchen doxycycline (ADOXA) 50 MG tablet Take 50 mg by mouth 2 (two) times daily.      . naproxen sodium (ANAPROX) 220 MG tablet Take 440 mg by mouth as needed (pain.).      Marland Kitchen pantoprazole (PROTONIX) 20 MG tablet Take 20 mg by mouth daily.      . simvastatin (ZOCOR) 40 MG tablet Take 40 mg by mouth every evening.      . tiotropium (SPIRIVA) 18 MCG inhalation capsule Place 18 mcg into inhaler and inhale every morning.        VITALS: Blood pressure 100/65, pulse 123, temperature 97.5 F (36.4 C), temperature source Axillary, resp. rate 26, height $RemoveBe'5\' 9"'[id number]$  (1.753 m), weight 63.7 kg (140 lb 6.9 oz), SpO2 96.00%.  PHYSICAL EXAM: General appearance: alert, cooperative, cachectic and moderate distress Neck: no adenopathy, no carotid bruit, no JVD, supple, symmetrical, trachea  midline and thyroid not enlarged, symmetric, no tenderness/mass/nodules Lungs: diminished breath sounds bilaterally, rhonchi bilaterally and wheezes bilaterally Heart: irregularly irregular rhythm and Heart sounds are very distant, no appreciable murmurs or gallops Abdomen: soft, non-tender; bowel sounds normal; no masses,  no organomegaly Extremities: extremities normal, atraumatic, no cyanosis or edema Pulses: Normal laboratory pulses without subclavian bruits, extensive scars of previous aortobifemoral femoral-popliteal bypass surgery, 1+ pedal pulses bilaterally Skin: Skin color, texture, turgor normal. No rashes or lesions Neurologic: Grossly normal  LABS: Results for orders placed during the hospital encounter of 08/16/13 (from the past 48 hour(s))  CBC WITH DIFFERENTIAL     Status: Abnormal   Collection Time    08/16/13  4:30 PM      Result Value Range   WBC 8.1  4.0 - 10.5 K/uL   RBC 4.14 (*)  4.22 - 5.81 MIL/uL   Hemoglobin 12.7 (*) 13.0 - 17.0 g/dL   HCT 38.0 (*) 39.0 - 52.0 %   MCV 91.8  78.0 - 100.0 fL   MCH 30.7  26.0 - 34.0 pg   MCHC 33.4  30.0 - 36.0 g/dL   RDW 15.1  11.5 - 15.5 %   Platelets 100 (*) 150 - 400 K/uL   Comment: REPEATED TO VERIFY     SPECIMEN CHECKED FOR CLOTS     PLATELET COUNT CONFIRMED BY SMEAR   Neutrophils Relative % 25 (*) 43 - 77 %   Neutro Abs 2.0  1.7 - 7.7 K/uL   Lymphocytes Relative 73 (*) 12 - 46 %   Lymphs Abs 5.9 (*) 0.7 - 4.0 K/uL   Monocytes Relative 2 (*) 3 - 12 %   Monocytes Absolute 0.1  0.1 - 1.0 K/uL   Eosinophils Relative 1  0 - 5 %   Eosinophils Absolute 0.1  0.0 - 0.7 K/uL   Basophils Relative 0  0 - 1 %   Basophils Absolute 0.0  0.0 - 0.1 K/uL   WBC Morphology ATYPICAL LYMPHOCYTES     Smear Review LARGE PLATELETS PRESENT    BASIC METABOLIC PANEL     Status: Abnormal   Collection Time    08/16/13  4:30 PM      Result Value Range   Sodium 138  137 - 147 mEq/L   Potassium 4.2  3.7 - 5.3 mEq/L   Chloride 103  96 - 112  mEq/L   CO2 22  19 - 32 mEq/L   Glucose, Bld 117 (*) 70 - 99 mg/dL   BUN 23  6 - 23 mg/dL   Creatinine, Ser 0.79  0.50 - 1.35 mg/dL   Calcium 8.6  8.4 - 10.5 mg/dL   GFR calc non Af Amer 87 (*) >90 mL/min   GFR calc Af Amer >90  >90 mL/min   Comment: (NOTE)     The eGFR has been calculated using the CKD EPI equation.     This calculation has not been validated in all clinical situations.     eGFR's persistently <90 mL/min signify possible Chronic Kidney     Disease.  D-DIMER, QUANTITATIVE     Status: Abnormal   Collection Time    08/16/13  4:30 PM      Result Value Range   D-Dimer, Quant 2.92 (*) 0.00 - 0.48 ug/mL-FEU   Comment:            AT THE INHOUSE ESTABLISHED CUTOFF     VALUE OF 0.48 ug/mL FEU,     THIS ASSAY HAS BEEN DOCUMENTED     IN THE LITERATURE TO HAVE     A SENSITIVITY AND NEGATIVE     PREDICTIVE VALUE OF AT LEAST     98 TO 99%.  THE TEST RESULT     SHOULD BE CORRELATED WITH     AN ASSESSMENT OF THE CLINICAL     PROBABILITY OF DVT / VTE.  CULTURE, BLOOD (ROUTINE X 2)     Status: None   Collection Time    08/16/13  7:37 PM      Result Value Range   Specimen Description BLOOD RIGHT ARM     Special Requests BOTTLES DRAWN AEROBIC AND ANAEROBIC 6CC     Culture  Setup Time       Value: 08/16/2013 22:39     Performed at [facility]  Value:        BLOOD CULTURE RECEIVED NO GROWTH TO DATE CULTURE WILL BE HELD FOR 5 DAYS BEFORE ISSUING A FINAL NEGATIVE REPORT     Performed at [facility]   Report Status PENDING    POCT I-STAT TROPONIN I     Status: None   Collection Time    08/16/13  7:51 PM      Result Value Range   Troponin i, poc 0.03  0.00 - 0.08 ng/mL   Comment 3            Comment: Due to the release kinetics of cTnI,     a negative result within the first hours     of the onset of symptoms does not rule out     myocardial infarction with certainty.     If myocardial infarction is still suspected,     repeat the test at  appropriate intervals.  CG4 I-STAT (LACTIC ACID)     Status: None   Collection Time    08/16/13  7:53 PM      Result Value Range   Lactic Acid, Venous 2.05  0.5 - 2.2 mmol/L  MRSA PCR SCREENING     Status: None   Collection Time    08/16/13 11:01 PM      Result Value Range   MRSA by PCR NEGATIVE  NEGATIVE   Comment:            The GeneXpert MRSA Assay (FDA     approved for NASAL specimens     only), is one component of a     comprehensive MRSA colonization     surveillance program. It is not     intended to diagnose MRSA     infection nor to guide or     monitor treatment for     MRSA infections.  CBC     Status: Abnormal   Collection Time    08/17/13  3:05 AM      Result Value Range   WBC 5.3  4.0 - 10.5 K/uL   RBC 3.94 (*) 4.22 - 5.81 MIL/uL   Hemoglobin 11.8 (*) 13.0 - 17.0 g/dL   HCT 37.1 (*) 39.0 - 52.0 %   MCV 94.2  78.0 - 100.0 fL   MCH 29.9  26.0 - 34.0 pg   MCHC 31.8  30.0 - 36.0 g/dL   RDW 15.1  11.5 - 15.5 %   Platelets 94 (*) 150 - 400 K/uL   Comment: CONSISTENT WITH PREVIOUS RESULT  BASIC METABOLIC PANEL     Status: Abnormal   Collection Time    08/17/13  3:05 AM      Result Value Range   Sodium 136 (*) 137 - 147 mEq/L   Potassium 4.2  3.7 - 5.3 mEq/L   Chloride 103  96 - 112 mEq/L   CO2 17 (*) 19 - 32 mEq/L   Glucose, Bld 211 (*) 70 - 99 mg/dL   BUN 17  6 - 23 mg/dL   Creatinine, Ser 0.70  0.50 - 1.35 mg/dL   Calcium 7.5 (*) 8.4 - 10.5 mg/dL   GFR calc non Af Amer >90  >90 mL/min   GFR calc Af Amer >90  >90 mL/min   Comment: (NOTE)     The eGFR has been calculated using the CKD EPI equation.     This calculation has not been validated in all clinical situations.     eGFR's persistently <90 mL/min  signify possible Chronic Kidney     Disease.  TROPONIN I     Status: None   Collection Time    08/17/13  3:05 AM      Result Value Range   Troponin I <0.30  <0.30 ng/mL   Comment:            Due to the release kinetics of cTnI,     a negative result  within the first hours     of the onset of symptoms does not rule out     myocardial infarction with certainty.     If myocardial infarction is still suspected,     repeat the test at appropriate intervals.  PRO B NATRIURETIC PEPTIDE     Status: Abnormal   Collection Time    08/17/13  3:05 AM      Result Value Range   Pro B Natriuretic peptide (BNP) 2840.0 (*) 0 - 125 pg/mL    IMAGING: Dg Chest 2 View  08/16/2013   CLINICAL DATA:  Chest pain.  Cough.  Short of breath.  EXAM: CHEST  2 VIEW  COMPARISON:  03/30/2008  FINDINGS: Moderate cardiomegaly. Stable right pleural effusion. No pneumothorax. The previously described loculated hydro pneumothorax is no longer visualized. Pulmonary opacities at the right pacer worrisome for airspace disease.  IMPRESSION: Right pleural effusion and basilar pulmonary opacities.   Electronically Signed   By: Maryclare Bean M.D.   On: 08/16/2013 17:07   Ct Angio Chest Pe W/cm &/or Wo Cm  08/16/2013   CLINICAL DATA:  Shortness of breath, elevated D-dimer, chest pain, prior lung cancer, status post right upper lobectomy  EXAM: CT ANGIOGRAPHY CHEST WITH CONTRAST  TECHNIQUE: Multidetector CT imaging of the chest was performed using the standard protocol during bolus administration of intravenous contrast. Multiplanar CT image reconstructions including MIPs were obtained to evaluate the vascular anatomy.  CONTRAST:  173mL OMNIPAQUE IOHEXOL 350 MG/ML SOLN  COMPARISON:  03/26/2008, 03/27/2008  FINDINGS: Pulmonary arteries appear patent. No significant filling defect or pulmonary embolus demonstrated by CTA. Atherosclerotic changes of the aorta. Thoracic aorta is not opacified because of the phase of imaging. Coronal calcifications noted. Normal heart size. No pericardial effusion. Chronic residual small right base effusion with small subpulmonic loculated components. Increased mediastinal adenopathy diffusely. Prevascular and AP window lymph nodes are enlarged, index measurement 28  x 17 mm, image 36. Right paratracheal adenopathy measures 18 x 27 mm. Postop changes from right upper lobectomy.  Included upper abdomen demonstrates incidental small gallstones. Celiac and SMA vascular stents noted. Degenerative changes of the spine. No acute osseous finding.  Lung windows demonstrate postop changes from right upper lobectomy. Background centrilobular emphysema throughout both lungs. Scattered areas of parenchymal scarring along the right fissure noted. Right base bandlike density is also compatible with scarring from the chronic residual small right effusion. Lung windows are limited with motion artifact. Scattered areas of pleural thickening on the right from prior surgery and chest tube. Minor left base atelectasis as well. No definite superimposed airspace process or pneumonia.  Review of the MIP images confirms the above findings.  IMPRESSION: Negative for significant acute pulmonary embolus.  Postop changes right upper lobectomy  Interval increased mediastinal adenopathy, index measurements as above. Largest lymph nodes in the left prevascular/ AP window space and the right paratracheal region.  Small chronic residual right subpulmonic effusion with small loculated components and pleural thickening all appearing chronic.  Centrilobular emphysema  No superimposed definite pneumonia or edema   Electronically  Signed   By: Daryll Brod M.D.   On: 08/16/2013 18:20   Dg Chest Port 1 View  08/17/2013   CLINICAL DATA:  Respiratory failure  EXAM: PORTABLE CHEST - 1 VIEW  COMPARISON:  08/16/2013  FINDINGS: There are prominent bronchovascular markings with coarse reticular and patchy airspace opacity evident at the right lung base, where there is also a minimal effusion. These findings are stable. No pneumothorax.  Cardiac silhouette is mildly enlarged. The mediastinum is normal in contour.  IMPRESSION: No change from the previous day's study.   Electronically Signed   By: Lajean Manes M.D.   On:  08/17/2013 08:04    ECG shows atrial fibrillation with rapid ventricular response and nonspecific ST-T wave changes. It is difficult to accurately measure the QT interval  IMPRESSION: 1. Atrial fibrillation rapid ventricular response. The current episode has just started during this admission, but there is documentation of previous brief atrial fibrillation in the past. He is unaware of palpitations. It is possible that he has asymptomatic episodes at home. The current episode of exacerbation is likely linked to his acute respiratory decompensation. 2. Rate control has been difficult to do to relative hypotension  RECOMMENDATION: 1. Continue diltiazem at 5 mg an hour 2. Start amiodarone bolus and infusion Full dose anticoagulation for stroke prevention and recommend continuing anticoagulation as an outpatient. Since he receives benefits through the [place] system, warfarin is likely to be our best option. 3. Will review echocardiogram, but that may be a very difficult study because of his emphysematous chest and tachycardia 4. it is highly likely that he does have coronary artery disease, but at this point there is no reason to suspect acute coronary insufficiency. 5. in view of his extensive peripheral arterial disease problems, it is surprising that he is not receiving a statin. We'll check a lipid profile  Time Spent Directly with Patient: 45 minutes  Sanda Klein, MD, [place] [phone] office [phone] pager  08/17/2013, 10:49 AM

## 2013-08-17 NOTE — Progress Notes (Addendum)
Name: [name] MRN: [id number] DOB: Aug 28, 1940    ADMISSION DATE:  08/16/2013  REFERRING MD :  EDP PRIMARY SERVICE: PCCM  CHIEF COMPLAINT:  Dyspnea  BRIEF PATIENT DESCRIPTION: 7   72 M with COPD, hx of lung ca, PVD, current smoker presented to [facility] ED with 2 wks of progressive dyspnea and chest tightness minimally and transiently responsive to albuterol MDI. Denies fever, purulent sputum, pleurodynia, hemoptysis, LE edema, calf tenderness. However, cough is worse than baseline and sputum is thick Found to be in AF with RVR. He has no known prior hx of AF. He is mosestly better after nebulized BDs in the ED.  PCCM admitting 08/16/2013     SIGNIFICANT EVENTS / STUDIES:  1/27 CT chest: Negative for PE.  Postop changes right upper lobectomy  Interval increased mediastinal adenopathy. Largest lymph nodes in the left prevascular/ AP window space and the right paratracheal region.  Small chronic residual right subpulmonic effusion with small loculated components and pleural thickening all appearing chronic.  Centrilobular emphysema.   LINES / TUBES:   CULTURES: Blood 1/27 >>  resp virus 1/28 Flu pcr 1/28 >>  ANTIBIOTICS: Doxycycline (chronic) tamiflu   SUBJECTIVE/OVERNIGHT/INTERVAL HX   08/17/13: Staff MD hx: admitted for mild chest pain. In resp distress during ICU tx. Started on bipap till this AM. Now on 6L.     VITAL SIGNS: Temp:  [97.1 F (36.2 C)-97.7 F (36.5 C)] 97.5 F (36.4 C) (01/28 0800) Pulse Rate:  [30-155] 123 (01/28 0830) Resp:  [18-45] 26 (01/28 0830) BP: (42-140)/(23-123) 100/65 mmHg (01/28 0830) SpO2:  [95 %-100 %] 96 % (01/28 0830) FiO2 (%):  [35 %-70 %] 35 % (01/28 0730) Weight:  [139 lb 5.3 oz (63.2 kg)-140 lb 6.9 oz (63.7 kg)] 140 lb 6.9 oz (63.7 kg) (01/28 0400) HEMODYNAMICS:   VENTILATOR SETTINGS: Vent Mode:  [-]  FiO2 (%):  [35 %-70 %] 35 % INTAKE / OUTPUT: Intake/Output     01/27 0701 - 01/28 0700 01/28 0701 - 01/29 0700   Other 100  10   IV Piggyback 500    Total Intake(mL/kg) 600 (9.4) 10 (0.2)   Urine (mL/kg/hr) 325    Total Output 325     Net +275 +10          PHYSICAL EXAMINATION: General:  Mildly dyspneic @ rest Neuro:  Cognition intact, no focal deficits HEENT:  EOMI, PERRL Cardiovascular: IRIR, tachy, no M Lungs: diffuse coarse rhonchi, ++wheezes noted Abdomen: Soft, NT, NABS Ext: warm, no edema, diminished DP pulses  LABS: PULMONARY No results found for this basename: PHART, PCO2, PCO2ART, PO2, PO2ART, HCO3, TCO2, O2SAT,  in the last 168 hours  CBC  Recent Labs Lab 08/16/13 1630 08/17/13 0305  HGB 12.7* 11.8*  HCT 38.0* 37.1*  WBC 8.1 5.3  PLT 100* 94*    COAGULATION No results found for this basename: INR,  in the last 168 hours  CARDIAC   Recent Labs Lab 08/17/13 0305  TROPONINI <0.30    Recent Labs Lab 08/17/13 0305  PROBNP 2840.0*     CHEMISTRY  Recent Labs Lab 08/16/13 1630 08/17/13 0305  NA 138 136*  K 4.2 4.2  CL 103 103  CO2 22 17*  GLUCOSE 117* 211*  BUN 23 17  CREATININE 0.79 0.70  CALCIUM 8.6 7.5*   Estimated Creatinine Clearance: 74.1 ml/min (by C-G formula based on Cr of 0.7).   LIVER No results found for this basename: AST, ALT, ALKPHOS, BILITOT, PROT,  ALBUMIN, INR,  in the last 168 hours   INFECTIOUS  Recent Labs Lab 08/16/13 1953  LATICACIDVEN 2.05     ENDOCRINE CBG (last 3)  No results found for this basename: GLUCAP,  in the last 72 hours       IMAGING x48h  Dg Chest 2 View  08/16/2013   CLINICAL DATA:  Chest pain.  Cough.  Short of breath.  EXAM: CHEST  2 VIEW  COMPARISON:  03/30/2008  FINDINGS: Moderate cardiomegaly. Stable right pleural effusion. No pneumothorax. The previously described loculated hydro pneumothorax is no longer visualized. Pulmonary opacities at the right pacer worrisome for airspace disease.  IMPRESSION: Right pleural effusion and basilar pulmonary opacities.   Electronically Signed   By: Maryclare Bean M.D.    On: 08/16/2013 17:07   Ct Angio Chest Pe W/cm &/or Wo Cm  08/16/2013   CLINICAL DATA:  Shortness of breath, elevated D-dimer, chest pain, prior lung cancer, status post right upper lobectomy  EXAM: CT ANGIOGRAPHY CHEST WITH CONTRAST  TECHNIQUE: Multidetector CT imaging of the chest was performed using the standard protocol during bolus administration of intravenous contrast. Multiplanar CT image reconstructions including MIPs were obtained to evaluate the vascular anatomy.  CONTRAST:  164mL OMNIPAQUE IOHEXOL 350 MG/ML SOLN  COMPARISON:  03/26/2008, 03/27/2008  FINDINGS: Pulmonary arteries appear patent. No significant filling defect or pulmonary embolus demonstrated by CTA. Atherosclerotic changes of the aorta. Thoracic aorta is not opacified because of the phase of imaging. Coronal calcifications noted. Normal heart size. No pericardial effusion. Chronic residual small right base effusion with small subpulmonic loculated components. Increased mediastinal adenopathy diffusely. Prevascular and AP window lymph nodes are enlarged, index measurement 28 x 17 mm, image 36. Right paratracheal adenopathy measures 18 x 27 mm. Postop changes from right upper lobectomy.  Included upper abdomen demonstrates incidental small gallstones. Celiac and SMA vascular stents noted. Degenerative changes of the spine. No acute osseous finding.  Lung windows demonstrate postop changes from right upper lobectomy. Background centrilobular emphysema throughout both lungs. Scattered areas of parenchymal scarring along the right fissure noted. Right base bandlike density is also compatible with scarring from the chronic residual small right effusion. Lung windows are limited with motion artifact. Scattered areas of pleural thickening on the right from prior surgery and chest tube. Minor left base atelectasis as well. No definite superimposed airspace process or pneumonia.  Review of the MIP images confirms the above findings.  IMPRESSION:  Negative for significant acute pulmonary embolus.  Postop changes right upper lobectomy  Interval increased mediastinal adenopathy, index measurements as above. Largest lymph nodes in the left prevascular/ AP window space and the right paratracheal region.  Small chronic residual right subpulmonic effusion with small loculated components and pleural thickening all appearing chronic.  Centrilobular emphysema  No superimposed definite pneumonia or edema   Electronically Signed   By: Daryll Brod M.D.   On: 08/16/2013 18:20   Dg Chest Port 1 View  08/17/2013   CLINICAL DATA:  Respiratory failure  EXAM: PORTABLE CHEST - 1 VIEW  COMPARISON:  08/16/2013  FINDINGS: There are prominent bronchovascular markings with coarse reticular and patchy airspace opacity evident at the right lung base, where there is also a minimal effusion. These findings are stable. No pneumothorax.  Cardiac silhouette is mildly enlarged. The mediastinum is normal in contour.  IMPRESSION: No change from the previous day's study.   Electronically Signed   By: Lajean Manes M.D.   On: 08/17/2013 08:04  ASSESSMENT / PLAN:  PULMONARY A:  Acute on chronic respiratory failure AECOPD Hx of lung cancer s/p resection 2009 Increased mediatinal LAN, nonspecific  1/28 - 6L [place] and mod resp diatress (staf MD coment) P:   Supplemental O2 Nebulized steroids and bronchodilators Systemic steroids PRN BiPAP; needs it more than off (staff MD comment) Check abg Needs opd fu with pulm for mediastinal nodes ( staff MD comment(  CARDIOVASCULAR A:  Nonspecific chest pain PRWP on EKG New onset AFRVR Peripheral vascular disease P:  Diltiazem infusion to maintain HR 70-115, stopped due to hypotension. 1/28 500cc ns(bnp noted) resume ca channel blocker. May need amio or dig. Will call cards 1/28(giving fluids due to low bp and pvd ) may need pressors IE neo. 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 May require pressors and intubation. Cards consulted( 1/28 0930)    Richardson Landry Minor ACNP Maryanna Shape PCCM Pager [phone] till 3 pm If no answer page [phone] 08/17/2013, 9:14 AM    Staff MD  - see my comments above  Dr. Brand Males, M.D., [facility].C.P Pulmonary and Critical Care Medicine Staff Physician [place] Pager: [phone], If no answer or between  15:00h - 7:00h: call [phone]  08/17/2013 11:01 AM

## 2013-08-17 NOTE — Procedures (Signed)
Central Venous Catheter Insertion Procedure Note [name] [id number] May 15, 1941  Procedure: Insertion of Central Venous Catheter Indications: Assessment of intravascular volume, Drug and/or fluid administration and Frequent blood sampling  Procedure Details Consent: Risks of procedure as well as the alternatives and risks of each were explained to the (patient/caregiver).  Consent for procedure obtained. Time Out: Verified patient identification, verified procedure, site/side was marked, verified correct patient position, special equipment/implants available, medications/allergies/relevent history reviewed, required imaging and test results available.  Performed  Maximum sterile technique was used including antiseptics, cap, gloves, gown, hand hygiene, mask and sheet. Skin prep: Chlorhexidine; local anesthetic administered A antimicrobial bonded/coated triple lumen catheter was placed in the left internal jugular vein using the Seldinger technique.  Evaluation Blood flow good Complications: No apparent complications Patient did tolerate procedure well. Chest X-ray ordered to verify placement.  CXR: pending.  U/S used in placement.  YACOUB,WESAM 08/17/2013, 2:32 PM

## 2013-08-17 NOTE — Progress Notes (Signed)
Echocardiogram 2D Echocardiogram has been performed.  [name] 08/17/2013, 11:56 AM

## 2013-08-17 NOTE — Progress Notes (Signed)
CARE MANAGEMENT NOTE 08/17/2013  Patient:  [name], [name]   Account Number:  [id number]  Date Initiated:  08/17/2013  Documentation initiated by:  DAVIS,RHONDA  Subjective/Objective Assessment:   pt admitted from home due to increased dyspnea,weakness and palpations, found to be in a.fib     Action/Plan:   home when stable   Anticipated DC Date:  08/20/2013   Anticipated DC Plan:  HOME/SELF CARE  In-house referral  NA      DC Planning Services  NA      [facility] Choice  NA   Choice offered to / List presented to:  NA   DME arranged  NA      DME agency  NA     [place] arranged  NA      [place] agency  NA   Status of service:  In process, will continue to follow Medicare Important Message given?  NA - LOS <3 / Initial given by admissions (If response is "NO", the following Medicare IM given date fields will be blank) Date Medicare IM given:   Date Additional Medicare IM given:    Discharge Disposition:    Per UR Regulation:  Reviewed for med. necessity/level of care/duration of stay  If discussed at [place] of Stay Meetings, dates discussed:    Comments:  [id number]/Rhonda Eldridge Dace, [place], [place] [phone] Chart Reviewed for discharge and hospital needs. Discharge needs at time of review:  None present will follow for needs. Review of patient progress due on [id number].

## 2013-08-17 NOTE — Procedures (Signed)
Intubation Procedure Note [name] [id number] 1940/12/12  Procedure: Intubation Indications: Respiratory insufficiency  Procedure Details Consent: Risks of procedure as well as the alternatives and risks of each were explained to the (patient/caregiver).  Consent for procedure obtained. Time Out: Verified patient identification, verified procedure, site/side was marked, verified correct patient position, special equipment/implants available, medications/allergies/relevent history reviewed, required imaging and test results available.  Performed  Maximum sterile technique was used including antiseptics, gloves, hand hygiene and mask.  MAC    Evaluation Hemodynamic Status: BP stable throughout; O2 sats: stable throughout Patient's Current Condition: stable Complications: No apparent complications Patient did tolerate procedure well. Chest X-ray ordered to verify placement.  CXR: pending.   [name] 08/17/2013

## 2013-08-17 NOTE — Progress Notes (Addendum)
INITIAL NUTRITION ASSESSMENT  DOCUMENTATION CODES Per approved criteria  -Not Applicable   INTERVENTION: - Start TF via OGT of Vital AF 1.2 at 59ml/hr and increase by 5ml every 4 hours to goal of 29ml/hr. Goal rate will provide 1584 calories, 99g protein and meet 103% estimated calorie needs and 100% estimated protein needs - Initiate adult enteral protocol  - Will continue to monitor   NUTRITION DIAGNOSIS: Inadequate oral intake related to inability to eat as evidenced by NPO.   Goal: TF to meet >90% of estimated nutritional needs  Monitor:  Weights, labs, diet advancement  Reason for Assessment: Malnutrition screening tool   73 y.o. male  Admitting Dx: Acute on chronic respiratory failure  ASSESSMENT: Pt discussed during multidisciplinary rounds. Pt with COPD, hx of lung ca, PVD, current smoker presented to [facility] ED with 2 wks of progressive dyspnea and chest tightness. However, cough is worse than baseline and sputum is thick, found to be in atrial fibrillation with RVR.   Met with pt who reports he was eating 2 meals/day and drinks 1 Boost/day at home. Thinks he's lost 30 pounds in the past year.   Addendum: Pt intubated after assessment completed.   Patient is currently intubated on ventilator support.  MV: 7.1 L/min Temp (24hrs), Avg:98.4 F (36.9 C), Min:97.1 F (36.2 C), Max:99 F (37.2 C)  Propofol: off   Nutrition Focused Physical Exam:  Subcutaneous Fat:  Orbital Region: WNL Upper Arm Region: WNL Thoracic and Lumbar Region: NA  Muscle:  Temple Region: WNL Clavicle Bone Region: WNL Clavicle and Acromion Bone Region: WNL Scapular Bone Region: NA Dorsal Hand: WNL Patellar Region: WNL Anterior Thigh Region: WNL Posterior Calf Region: WNL  Edema: None noted      Height: Ht Readings from Last 1 Encounters:  08/16/13 $RemoveB'5\' 9"'wBSlONGZ$  (1.753 m)    Weight: Wt Readings from Last 1 Encounters:  08/17/13 140 lb 6.9 oz (63.7 kg)    Ideal Body Weight: 160  lb   % Ideal Body Weight: 87%  Wt Readings from Last 10 Encounters:  08/17/13 140 lb 6.9 oz (63.7 kg)    Usual Body Weight: 170 lb per pt  % Usual Body Weight: 82%  BMI:  Body mass index is 20.73 kg/(m^2).  Estimated Nutritional Needs: Kcal: 1537 Protein: 95-115g Fluid: >1.5L/day   Skin: Intact   Diet Order: NPO  EDUCATION NEEDS: -No education needs identified at this time   Intake/Output Summary (Last 24 hours) at 08/17/13 1234 Last data filed at 08/17/13 1100  Gross per 24 hour  Intake    645 ml  Output    325 ml  Net    320 ml    Last BM: 1/27   Labs:   Recent Labs Lab 08/16/13 1630 08/17/13 0305  NA 138 136*  K 4.2 4.2  CL 103 103  CO2 22 17*  BUN 23 17  CREATININE 0.79 0.70  CALCIUM 8.6 7.5*  GLUCOSE 117* 211*    CBG (last 3)  No results found for this basename: GLUCAP,  in the last 72 hours  Scheduled Meds: . antiseptic oral rinse  15 mL Mouth Rinse q12n4p  . aspirin  81 mg Oral Daily  . budesonide  0.25 mg Nebulization Q6H  . buPROPion  150 mg Oral BID  . chlorhexidine  15 mL Mouth Rinse BID  . clopidogrel  75 mg Oral Q breakfast  . doxycycline  100 mg Oral Q12H  . enoxaparin (LOVENOX) injection  40 mg Subcutaneous Q24H  .  ipratropium  0.5 mg Nebulization Q6H  . levalbuterol  0.63 mg Nebulization Q6H  . methylPREDNISolone (SOLU-MEDROL) injection  80 mg Intravenous Q12H  . oseltamivir  75 mg Oral BID  . pantoprazole  40 mg Oral Q1200    Continuous Infusions: . amiodarone (NEXTERONE PREMIX) 360 mg/200 mL dextrose 60 mg/hr (08/17/13 1214)   Followed by  . amiodarone (NEXTERONE PREMIX) 360 mg/200 mL dextrose    . diltiazem (CARDIZEM) infusion 5 mg/hr (08/17/13 1100)    Past Medical History  Diagnosis Date  . Coronary artery disease   . Cancer     lung    Past Surgical History  Procedure Laterality Date  . Cataract extraction    . Venous bypass    . Lung removal, partial      Mikey College MS, RD, LDN [phone]  Pager [phone] After Hours Pager

## 2013-08-17 NOTE — Progress Notes (Signed)
Pt refusing bipap at this time, requesting water to drink. Education provided on reason for NPO status. Patient remains angry he cannot have water to drink.

## 2013-08-18 ENCOUNTER — Inpatient Hospital Stay (HOSPITAL_COMMUNITY): Payer: Medicare Other

## 2013-08-18 DIAGNOSIS — A419 Sepsis, unspecified organism: Secondary | ICD-10-CM

## 2013-08-18 DIAGNOSIS — Z85118 Personal history of other malignant neoplasm of bronchus and lung: Secondary | ICD-10-CM

## 2013-08-18 DIAGNOSIS — I5041 Acute combined systolic (congestive) and diastolic (congestive) heart failure: Secondary | ICD-10-CM

## 2013-08-18 DIAGNOSIS — R6521 Severe sepsis with septic shock: Secondary | ICD-10-CM

## 2013-08-18 LAB — RESPIRATORY VIRUS PANEL
ADENOVIRUS: NOT DETECTED
INFLUENZA A: NOT DETECTED
Influenza A H1: NOT DETECTED
Influenza A H3: NOT DETECTED
Influenza B: NOT DETECTED
Metapneumovirus: NOT DETECTED
PARAINFLUENZA 2 A: NOT DETECTED
Parainfluenza 1: NOT DETECTED
Parainfluenza 3: NOT DETECTED
Respiratory Syncytial Virus A: NOT DETECTED
Respiratory Syncytial Virus B: NOT DETECTED
Rhinovirus: NOT DETECTED

## 2013-08-18 LAB — BLOOD GAS, ARTERIAL
ACID-BASE DEFICIT: 11.2 mmol/L — AB (ref 0.0–2.0)
Acid-base deficit: 16 mmol/L — ABNORMAL HIGH (ref 0.0–2.0)
Bicarbonate: 16.1 mEq/L — ABNORMAL LOW (ref 20.0–24.0)
Bicarbonate: 13.2 mEq/L — ABNORMAL LOW (ref 20.0–24.0)
DRAWN BY: 31814
DRAWN BY: 308601
FIO2: 0.4 %
FIO2: 0.4 %
LHR: 14 {breaths}/min
MECHVT: 500 mL
O2 Saturation: 96 %
O2 Saturation: 94 %
PCO2 ART: 42.3 mmHg (ref 35.0–45.0)
PCO2 ART: 42 mmHg (ref 35.0–45.0)
PEEP: 5 cmH2O
PEEP: 5 cmH2O
PH ART: 7.119 — AB (ref 7.350–7.450)
PO2 ART: 84.8 mmHg (ref 80.0–100.0)
Patient temperature: 98.6
Patient temperature: 36.3
RATE: 14 resp/min
TCO2: 14.9 mmol/L (ref 0–100)
TCO2: 12.7 mmol/L (ref 0–100)
VT: 500 mL
pH, Arterial: 7.206 — ABNORMAL LOW (ref 7.350–7.450)
pO2, Arterial: 101 mmHg — ABNORMAL HIGH (ref 80.0–100.0)

## 2013-08-18 LAB — CBC WITH DIFFERENTIAL/PLATELET
Basophils Absolute: 0 10*3/uL (ref 0.0–0.1)
Basophils Relative: 0 % (ref 0–1)
Eosinophils Absolute: 0 10*3/uL (ref 0.0–0.7)
Eosinophils Relative: 0 % (ref 0–5)
HEMATOCRIT: 42.4 % (ref 39.0–52.0)
Hemoglobin: 13.5 g/dL (ref 13.0–17.0)
LYMPHS ABS: 10.8 10*3/uL — AB (ref 0.7–4.0)
Lymphocytes Relative: 51 % — ABNORMAL HIGH (ref 12–46)
MCH: 30.6 pg (ref 26.0–34.0)
MCHC: 31.8 g/dL (ref 30.0–36.0)
MCV: 96.1 fL (ref 78.0–100.0)
MONO ABS: 0.6 10*3/uL (ref 0.1–1.0)
Monocytes Relative: 3 % (ref 3–12)
Neutro Abs: 9.7 10*3/uL — ABNORMAL HIGH (ref 1.7–7.7)
Neutrophils Relative %: 46 % (ref 43–77)
Platelets: 136 10*3/uL — ABNORMAL LOW (ref 150–400)
RBC: 4.41 MIL/uL (ref 4.22–5.81)
RDW: 15.3 % (ref 11.5–15.5)
WBC: 21.1 10*3/uL — ABNORMAL HIGH (ref 4.0–10.5)

## 2013-08-18 LAB — LACTIC ACID, PLASMA: Lactic Acid, Venous: 3.1 mmol/L — ABNORMAL HIGH (ref 0.5–2.2)

## 2013-08-18 LAB — COMPREHENSIVE METABOLIC PANEL
ALK PHOS: 65 U/L (ref 39–117)
ALT: 172 U/L — ABNORMAL HIGH (ref 0–53)
AST: 160 U/L — ABNORMAL HIGH (ref 0–37)
Albumin: 2.7 g/dL — ABNORMAL LOW (ref 3.5–5.2)
BUN: 39 mg/dL — AB (ref 6–23)
CO2: 17 mEq/L — ABNORMAL LOW (ref 19–32)
Calcium: 7 mg/dL — ABNORMAL LOW (ref 8.4–10.5)
Chloride: 100 mEq/L (ref 96–112)
Creatinine, Ser: 2.53 mg/dL — ABNORMAL HIGH (ref 0.50–1.35)
GFR calc non Af Amer: 24 mL/min — ABNORMAL LOW (ref >90)
GFR, EST AFRICAN AMERICAN: 27 mL/min — AB (ref >90)
Glucose, Bld: 298 mg/dL — ABNORMAL HIGH (ref 70–99)
Potassium: 6.3 mEq/L — ABNORMAL HIGH (ref 3.7–5.3)
Sodium: 132 mEq/L — ABNORMAL LOW (ref 137–147)
TOTAL PROTEIN: 5 g/dL — AB (ref 6.0–8.3)
Total Bilirubin: 0.6 mg/dL (ref 0.3–1.2)

## 2013-08-18 LAB — BASIC METABOLIC PANEL
BUN: 29 mg/dL — AB (ref 6–23)
CHLORIDE: 102 meq/L (ref 96–112)
CO2: 15 mEq/L — ABNORMAL LOW (ref 19–32)
Calcium: 8 mg/dL — ABNORMAL LOW (ref 8.4–10.5)
Creatinine, Ser: 1.57 mg/dL — ABNORMAL HIGH (ref 0.50–1.35)
GFR calc Af Amer: 49 mL/min — ABNORMAL LOW (ref >90)
GFR calc non Af Amer: 42 mL/min — ABNORMAL LOW (ref >90)
Glucose, Bld: 240 mg/dL — ABNORMAL HIGH (ref 70–99)
Potassium: 4.8 mEq/L (ref 3.7–5.3)
Sodium: 136 mEq/L — ABNORMAL LOW (ref 137–147)

## 2013-08-18 LAB — CBC
HCT: 43.7 % (ref 39.0–52.0)
Hemoglobin: 13.5 g/dL (ref 13.0–17.0)
MCH: 30.2 pg (ref 26.0–34.0)
MCHC: 30.9 g/dL (ref 30.0–36.0)
MCV: 97.8 fL (ref 78.0–100.0)
PLATELETS: 120 10*3/uL — AB (ref 150–400)
RBC: 4.47 MIL/uL (ref 4.22–5.81)
RDW: 15.5 % (ref 11.5–15.5)
WBC: 28.6 10*3/uL — AB (ref 4.0–10.5)

## 2013-08-18 LAB — HEMOGLOBIN A1C
HEMOGLOBIN A1C: 5.8 % — AB (ref <5.7)
Mean Plasma Glucose: 120 mg/dL — ABNORMAL HIGH (ref <117)

## 2013-08-18 LAB — TROPONIN I
Troponin I: <0.3 ng/mL (ref <0.30)
Troponin I: <0.3 ng/mL

## 2013-08-18 LAB — PATHOLOGIST SMEAR REVIEW

## 2013-08-18 LAB — GLUCOSE, CAPILLARY: GLUCOSE-CAPILLARY: 161 mg/dL — AB (ref 70–99)

## 2013-08-18 LAB — PHOSPHORUS: Phosphorus: 5.7 mg/dL — ABNORMAL HIGH (ref 2.3–4.6)

## 2013-08-18 LAB — MAGNESIUM: Magnesium: 2 mg/dL (ref 1.5–2.5)

## 2013-08-18 MED ORDER — EPINEPHRINE HCL 0.1 MG/ML IJ SOSY
PREFILLED_SYRINGE | INTRAMUSCULAR | Status: AC
  Filled 2013-08-18: qty 10

## 2013-08-18 MED ORDER — DEXTROSE 5 % IV SOLN
30.0000 ug/min | INTRAVENOUS | Status: DC
  Administered 2013-08-19: 50 ug/min via INTRAVENOUS
  Administered 2013-08-19 (×2): 100 ug/min via INTRAVENOUS
  Administered 2013-08-19: 150 ug/min via INTRAVENOUS
  Administered 2013-08-20: 60 ug/min via INTRAVENOUS
  Filled 2013-08-18 (×5): qty 4

## 2013-08-18 MED ORDER — HEPARIN (PORCINE) IN NACL 100-0.45 UNIT/ML-% IJ SOLN
900.0000 [IU]/h | INTRAMUSCULAR | Status: DC
  Administered 2013-08-18 – 2013-08-20 (×2): 900 [IU]/h via INTRAVENOUS
  Filled 2013-08-18 (×3): qty 250

## 2013-08-18 MED ORDER — HEPARIN SODIUM (PORCINE) 1000 UNIT/ML DIALYSIS
1000.0000 [IU] | INTRAMUSCULAR | Status: DC | PRN
  Filled 2013-08-18 (×2): qty 6

## 2013-08-18 MED ORDER — HYDROCORTISONE NA SUCCINATE PF 100 MG IJ SOLR
50.0000 mg | Freq: Four times a day (QID) | INTRAMUSCULAR | Status: DC
  Administered 2013-08-19 – 2013-08-22 (×15): 50 mg via INTRAVENOUS
  Filled 2013-08-18 (×19): qty 1
  Filled 2013-08-18: qty 2
  Filled 2013-08-18: qty 1

## 2013-08-18 MED ORDER — SODIUM CHLORIDE 0.9 % IV SOLN
INTRAVENOUS | Status: DC
  Administered 2013-08-18: 50 mL/h via INTRAVENOUS

## 2013-08-18 MED ORDER — SODIUM CHLORIDE 0.9 % FOR CRRT
INTRAVENOUS_CENTRAL | Status: DC | PRN
Start: 2013-08-18 — End: 2013-08-21
  Filled 2013-08-18: qty 1000

## 2013-08-18 MED ORDER — PANTOPRAZOLE SODIUM 40 MG IV SOLR
40.0000 mg | INTRAVENOUS | Status: DC
  Administered 2013-08-19 – 2013-08-22 (×5): 40 mg via INTRAVENOUS
  Filled 2013-08-18 (×7): qty 40

## 2013-08-18 MED ORDER — OSELTAMIVIR PHOSPHATE 6 MG/ML PO SUSR: 75.0000 mg | Freq: Every day | ORAL | Status: DC

## 2013-08-18 MED ORDER — FUROSEMIDE 10 MG/ML IJ SOLN
40.0000 mg | Freq: Once | INTRAMUSCULAR | Status: AC
  Administered 2013-08-18: 40 mg via INTRAVENOUS
  Filled 2013-08-18: qty 4

## 2013-08-18 MED ORDER — PRISMASOL BGK 4/2.5 32-4-2.5 MEQ/L IV SOLN
INTRAVENOUS | Status: DC
  Administered 2013-08-19 – 2013-08-21 (×17): via INTRAVENOUS_CENTRAL
  Filled 2013-08-18 (×30): qty 5000

## 2013-08-18 MED ORDER — MILRINONE IN DEXTROSE 20 MG/100ML IV SOLN
0.2500 ug/kg/min | INTRAVENOUS | Status: DC
  Administered 2013-08-18: 0.25 ug/kg/min via INTRAVENOUS
  Filled 2013-08-18: qty 100

## 2013-08-18 MED ORDER — PIPERACILLIN-TAZOBACTAM 3.375 G IVPB
3.3750 g | Freq: Three times a day (TID) | INTRAVENOUS | Status: DC
  Administered 2013-08-19 (×2): 3.375 g via INTRAVENOUS
  Filled 2013-08-18 (×5): qty 50

## 2013-08-18 MED ORDER — VANCOMYCIN HCL IN DEXTROSE 1-5 GM/200ML-% IV SOLN
1000.0000 mg | INTRAVENOUS | Status: DC
  Administered 2013-08-18 – 2013-08-21 (×4): 1000 mg via INTRAVENOUS
  Filled 2013-08-18 (×5): qty 200

## 2013-08-18 MED ORDER — SODIUM CHLORIDE 0.9 % IV BOLUS (SEPSIS)
750.0000 mL | Freq: Once | INTRAVENOUS | Status: AC
  Administered 2013-08-18: 750 mL via INTRAVENOUS

## 2013-08-18 MED ORDER — SODIUM CHLORIDE 0.9 % IV BOLUS (SEPSIS)
500.0000 mL | Freq: Once | INTRAVENOUS | Status: AC
  Administered 2013-08-18: 500 mL via INTRAVENOUS

## 2013-08-18 MED ORDER — PRISMASOL BGK 4/2.5 32-4-2.5 MEQ/L IV SOLN
INTRAVENOUS | Status: DC
  Administered 2013-08-18 – 2013-08-20 (×3): via INTRAVENOUS_CENTRAL
  Filled 2013-08-18 (×6): qty 5000

## 2013-08-18 MED ORDER — VASOPRESSIN 20 UNIT/ML IJ SOLN
0.0300 [IU]/min | INTRAVENOUS | Status: DC
  Administered 2013-08-18 – 2013-08-19 (×2): 0.03 [IU]/min via INTRAVENOUS
  Filled 2013-08-18 (×3): qty 2.5

## 2013-08-18 MED ORDER — INSULIN ASPART 100 UNIT/ML ~~LOC~~ SOLN
0.0000 [IU] | SUBCUTANEOUS | Status: DC
  Administered 2013-08-19 (×3): 2 [IU] via SUBCUTANEOUS
  Administered 2013-08-21 – 2013-08-22 (×5): 1 [IU] via SUBCUTANEOUS
  Administered 2013-08-23 – 2013-08-24 (×6): 2 [IU] via SUBCUTANEOUS
  Administered 2013-08-24 – 2013-08-25 (×4): 3 [IU] via SUBCUTANEOUS
  Administered 2013-08-25 (×2): 2 [IU] via SUBCUTANEOUS
  Administered 2013-08-25: 3 [IU] via SUBCUTANEOUS
  Administered 2013-08-25: 2 [IU] via SUBCUTANEOUS
  Administered 2013-08-25: 3 [IU] via SUBCUTANEOUS
  Administered 2013-08-26 (×3): 2 [IU] via SUBCUTANEOUS
  Administered 2013-08-26: 1 [IU] via SUBCUTANEOUS
  Administered 2013-08-27 (×3): 2 [IU] via SUBCUTANEOUS

## 2013-08-18 MED ORDER — SODIUM CHLORIDE 0.9 % IV SOLN
INTRAVENOUS | Status: AC
  Administered 2013-08-18: 23:00:00
  Filled 2013-08-18: qty 10

## 2013-08-18 MED ORDER — PRISMASOL BGK 4/2.5 32-4-2.5 MEQ/L IV SOLN
INTRAVENOUS | Status: DC
  Administered 2013-08-19 – 2013-08-20 (×3): via INTRAVENOUS_CENTRAL
  Filled 2013-08-18 (×5): qty 5000

## 2013-08-18 MED ORDER — SODIUM BICARBONATE 8.4 % IV SOLN
INTRAVENOUS | Status: DC
  Administered 2013-08-18: 22:00:00 via INTRAVENOUS
  Filled 2013-08-18 (×5): qty 150

## 2013-08-18 MED ORDER — SODIUM BICARBONATE 8.4 % IV SOLN
INTRAVENOUS | Status: AC
  Administered 2013-08-18: 50 meq
  Filled 2013-08-18: qty 50

## 2013-08-18 NOTE — Significant Event (Addendum)
Remains hypotensive/ oliguric.  Will add vasopressin.  RN notes increased abdominal distention.  Will get abd xray and check bladder pressure.  Will also d/c diltiazem and amiodarone in setting of bradycardia and hypotension.  Chesley Mires, MD 08/18/2013, 6:21 PM

## 2013-08-18 NOTE — Progress Notes (Signed)
Inpatient Diabetes Program Recommendations  AACE/ADA: New Consensus Statement on Inpatient Glycemic Control (2013)  Target Ranges:  Prepandial:   less than 140 mg/dL      Peak postprandial:   less than 180 mg/dL (1-2 hours)      Critically ill patients:  140 - 180 mg/dL   Reason for Visit: Hyperglycemia  Diabetes history: None Outpatient Diabetes medications: None Current orders for Inpatient glycemic control: None  Likely steroid-induced.  Inpatient Diabetes Program Recommendations Correction (SSI): Add Novolog mod Q4 hours HgbA1C: Check HgbA1C to assess glycemic control prior to hospitalization  Note: Will follow. Thank you. Lorenda Peck, RD, LDN, CDE Inpatient Diabetes Coordinator [phone]

## 2013-08-18 NOTE — Progress Notes (Signed)
PHARMACIST - PHYSICIAN COMMUNICATION DR: Chase Caller CONCERNING:  Oseltamivir dosing  DESCRIPTION:  [place] is experiencing a significant shortage of Oseltamivir 30mg  capsules.    This patient has an order for Oseltamivir (Tamiflu) and has an Estimated Creatinine Clearance: 37.8 ml/min (by C-G formula based on Cr of 1.57).. The package insert for Oseltamivir recommends 30mg  BID x 5 days for patients with CrCl 30-60 ml/min.  To preserve an adequate supply of 30mg  capsules for our patients with more significant renal impairment the Infectious Disease team has recommended to substitute Oseltamivir 75mg  qday x 5 days for patients with CrCl 30-60 ml/min at this time.   Action: Oseltamivir 75mg  PO DAILY x 5 days has been substituted for your patient. If you have any questions about this temporary substitution please feel free to call the Pharmacy at [phone] for assistance.  Gretta Arab PharmD, BCPS Pager [phone] 08/18/2013 11:29 AM

## 2013-08-18 NOTE — Significant Event (Signed)
ABG    Component Value Date/Time   PHART 7.119* 08/18/2013 2105   PCO2ART 42.0 08/18/2013 2105   PO2ART 84.8 08/18/2013 2105   HCO3 13.2* 08/18/2013 2105   TCO2 12.7 08/18/2013 2105   ACIDBASEDEF 16.0* 08/18/2013 2105   O2SAT 94.0 08/18/2013 2105    Will add HCO3 to IV fluid and increase RR to 16.  Chesley Mires, MD 08/18/2013, 9:23 PM

## 2013-08-18 NOTE — Progress Notes (Signed)
Pt intraabdominal, bladder pressure measurement of 20. Elink MD made aware. Cardizem and amiodarone stopped per MD orders. Tube feed stopped. NG tube placed to suction. Will continue to monitor.  Evorn Gong, RN/BSN

## 2013-08-18 NOTE — Progress Notes (Signed)
Name: [name] MRN: [id number] DOB: 01-01-1941    ADMISSION DATE:  08/16/2013  REFERRING MD :  EDP PRIMARY SERVICE: PCCM  CHIEF COMPLAINT:  Dyspnea  BRIEF PATIENT DESCRIPTION: 7   72 M with COPD, hx of lung ca, PVD, current smoker presented to [facility] ED with 2 wks of progressive dyspnea and chest tightness minimally and transiently responsive to albuterol MDI. Denies fever, purulent sputum, pleurodynia, hemoptysis, LE edema, calf tenderness. However, cough is worse than baseline and sputum is thick Found to be in AF with RVR. He has no known prior hx of AF. He is mosestly better after nebulized BDs in the ED.  PCCM admitting       LINES / TUBES: 1/28 OTT>> 1/28 l i j cvl>>  CULTURES: Blood 1/27 >>  resp virus 1/28 Flu pcr 1/28 >> NEG 1/19 sputum>>  ANTIBIOTICS: Doxycycline (chronic) tamiflu 1/28 (ongoing till resp virus multiplex pcr results return) >>   SIGNIFICANT EVENTS / STUDIES:  1/27 CT chest: Negative for PE.  Postop changes right upper lobectomy  Interval increased mediastinal adenopathy. Largest lymph nodes in the left prevascular/ AP window space and the right paratracheal region.  Small chronic residual right subpulmonic effusion with small loculated components and pleural thickening all appearing chronic.  Centrilobular emphysema.   1/28 intubated   SUBJECTIVE/OVERNIGHT/INTERVAL HX 08/18/13: Staff MD note: anxious on vent  VITAL SIGNS: Temp:  [96.4 F (35.8 C)-99 F (37.2 C)] 96.4 F (35.8 C) (01/29 9323) Pulse Rate:  [84-152] 90 (01/29 0633) Resp:  [14-38] 15 (01/29 0633) BP: (81-133)/(35-97) 119/84 mmHg (01/29 0600) SpO2:  [94 %-100 %] 97 % (01/29 0633) FiO2 (%):  [35 %-100 %] 50 % (01/29 0817) HEMODYNAMICS: CVP:  [11 mmHg-28 mmHg] 11 mmHg VENTILATOR SETTINGS: Vent Mode:  [-] PRVC FiO2 (%):  [35 %-100 %] 50 % Set Rate:  [14 bmp] 14 bmp Vt Set:  [500 mL] 500 mL PEEP:  [5 cmH20] 5 cmH20 Plateau Pressure:  [12 cmH20-18 cmH20] 17 cmH20 INTAKE  / OUTPUT: Intake/Output     01/28 0701 - 01/29 0700 01/29 0701 - 01/30 0700   I.V. (mL/kg) 584.1 (9.2)    Other 110    NG/GT 222.2    IV Piggyback     Total Intake(mL/kg) 916.3 (14.4)    Urine (mL/kg/hr) 1035 (0.7)    Total Output 1035     Net -118.7            PHYSICAL EXAMINATION: General:  Sedated on vent Neuro:  Sedated but follows commands HEENT:  EOMI, PERRL OTT-Vent,OGT Cardiovascular: IRIR, tachy, no M Lungs: diffuse coarse rhonchi, ++wheezes noted Abdomen: Soft, NT, +bs Ext: warm, no edema, diminished DP pulses  LABS: PULMONARY  Recent Labs Lab 08/17/13 1115 08/17/13 1535 08/18/13 0515  PHART 7.394 7.353 7.206*  PCO2ART 30.5* 34.2* 42.3  PO2ART 89.6 322.0* 101.0*  HCO3 18.2* 18.5* 16.1*  TCO2 16.6 16.9 14.9  O2SAT 97.1 99.5 96.0    CBC  Recent Labs Lab 08/16/13 1630 08/17/13 0305 08/18/13 0527  HGB 12.7* 11.8* 13.5  HCT 38.0* 37.1* 42.4  WBC 8.1 5.3 21.1*  PLT 100* 94* 136*    COAGULATION No results found for this basename: INR,  in the last 168 hours  CARDIAC    Recent Labs Lab 08/17/13 0305 08/17/13 1149 08/17/13 2240 08/18/13 0527  TROPONINI <0.30 <0.30 <0.30 <0.30    Recent Labs Lab 08/17/13 0305  PROBNP 2840.0*     CHEMISTRY  Recent Labs Lab 08/16/13  1630 08/17/13 0305 08/18/13 0527  NA 138 136* 136*  K 4.2 4.2 4.8  CL 103 103 102  CO2 22 17* 15*  GLUCOSE 117* 211* 240*  BUN 23 17 29*  CREATININE 0.79 0.70 1.57*  CALCIUM 8.6 7.5* 8.0*  MG  --   --  2.0  PHOS  --   --  5.7*   Estimated Creatinine Clearance: 37.8 ml/min (by C-G formula based on Cr of 1.57).   LIVER No results found for this basename: AST, ALT, ALKPHOS, BILITOT, PROT, ALBUMIN, INR,  in the last 168 hours   INFECTIOUS  Recent Labs Lab 08/16/13 1953  LATICACIDVEN 2.05     ENDOCRINE CBG (last 3)  No results found for this basename: GLUCAP,  in the last 72 hours  1/29 SSI started     IMAGING x48h  Dg Chest 2 View  08/16/2013    CLINICAL DATA:  Chest pain.  Cough.  Short of breath.  EXAM: CHEST  2 VIEW  COMPARISON:  03/30/2008  FINDINGS: Moderate cardiomegaly. Stable right pleural effusion. No pneumothorax. The previously described loculated hydro pneumothorax is no longer visualized. Pulmonary opacities at the right pacer worrisome for airspace disease.  IMPRESSION: Right pleural effusion and basilar pulmonary opacities.   Electronically Signed   By: Maryclare Bean M.D.   On: 08/16/2013 17:07   Ct Angio Chest Pe W/cm &/or Wo Cm  08/16/2013   CLINICAL DATA:  Shortness of breath, elevated D-dimer, chest pain, prior lung cancer, status post right upper lobectomy  EXAM: CT ANGIOGRAPHY CHEST WITH CONTRAST  TECHNIQUE: Multidetector CT imaging of the chest was performed using the standard protocol during bolus administration of intravenous contrast. Multiplanar CT image reconstructions including MIPs were obtained to evaluate the vascular anatomy.  CONTRAST:  145mL OMNIPAQUE IOHEXOL 350 MG/ML SOLN  COMPARISON:  03/26/2008, 03/27/2008  FINDINGS: Pulmonary arteries appear patent. No significant filling defect or pulmonary embolus demonstrated by CTA. Atherosclerotic changes of the aorta. Thoracic aorta is not opacified because of the phase of imaging. Coronal calcifications noted. Normal heart size. No pericardial effusion. Chronic residual small right base effusion with small subpulmonic loculated components. Increased mediastinal adenopathy diffusely. Prevascular and AP window lymph nodes are enlarged, index measurement 28 x 17 mm, image 36. Right paratracheal adenopathy measures 18 x 27 mm. Postop changes from right upper lobectomy.  Included upper abdomen demonstrates incidental small gallstones. Celiac and SMA vascular stents noted. Degenerative changes of the spine. No acute osseous finding.  Lung windows demonstrate postop changes from right upper lobectomy. Background centrilobular emphysema throughout both lungs. Scattered areas of  parenchymal scarring along the right fissure noted. Right base bandlike density is also compatible with scarring from the chronic residual small right effusion. Lung windows are limited with motion artifact. Scattered areas of pleural thickening on the right from prior surgery and chest tube. Minor left base atelectasis as well. No definite superimposed airspace process or pneumonia.  Review of the MIP images confirms the above findings.  IMPRESSION: Negative for significant acute pulmonary embolus.  Postop changes right upper lobectomy  Interval increased mediastinal adenopathy, index measurements as above. Largest lymph nodes in the left prevascular/ AP window space and the right paratracheal region.  Small chronic residual right subpulmonic effusion with small loculated components and pleural thickening all appearing chronic.  Centrilobular emphysema  No superimposed definite pneumonia or edema   Electronically Signed   By: Daryll Brod M.D.   On: 08/16/2013 18:20   Dg Chest [facility]  08/18/2013   CLINICAL DATA:  Acute on chronic respiratory failure. Endotracheal tube position.  EXAM: PORTABLE CHEST - 1 VIEW  COMPARISON:  08/17/2013  FINDINGS: Endotracheal tube tip is 16 mm above the carina and directed toward the right. Central venous catheter and NG tube appear in good position.  There is a new area of infiltrate or atelectasis at the left lung base. There is slight pulmonary vascular congestion which is new. Chronic changes throughout the right lung are stable.  IMPRESSION: 1. Endotracheal tube is 16 mm above the carina and directed toward the right. 2. New atelectasis/infiltrate at the left lung base. 3. New slight pulmonary vascular congestion.   Electronically Signed   By: Rozetta Nunnery M.D.   On: 08/18/2013 07:36   Dg Chest Port 1 View  08/17/2013   CLINICAL DATA:  Central line and endotracheal tube placement. Acute on chronic respiratory failure. COPD.  EXAM: PORTABLE CHEST - 1 VIEW 3:09 p.m.   COMPARISON:  08/17/2013 at 5:13 a.m.  FINDINGS: Endotracheal tube tip is 2.4 cm above the carina. Left jugular vein catheter tip is in the superior vena cava.  There is chronic elevation of the right hemidiaphragm with chronic blunting of the right costophrenic angle. There is pulmonary vascular congestion. No effusions.  IMPRESSION: Endotracheal tube and central line in good position. New pulmonary vascular congestion.   Electronically Signed   By: Rozetta Nunnery M.D.   On: 08/17/2013 15:28   Dg Chest Port 1 View  08/17/2013   CLINICAL DATA:  Respiratory failure  EXAM: PORTABLE CHEST - 1 VIEW  COMPARISON:  08/16/2013  FINDINGS: There are prominent bronchovascular markings with coarse reticular and patchy airspace opacity evident at the right lung base, where there is also a minimal effusion. These findings are stable. No pneumothorax.  Cardiac silhouette is mildly enlarged. The mediastinum is normal in contour.  IMPRESSION: No change from the previous day's study.   Electronically Signed   By: Lajean Manes M.D.   On: 08/17/2013 08:04      ASSESSMENT / PLAN:  PULMONARY A:  Acute on chronic respiratory failure AECOPD Hx of lung cancer s/p resection 2009 Increased mediatinal LAN, nonspecific  1/28 - 6L [place] and mod resp diatress (staf MD coment) 1/28 intubated for acute resp distress P:   Vent bundle Check sputum culture Needs opd fu with pulm for mediastinal nodes ( staff MD comment(  CARDIOVASCULAR A:  Nonspecific chest pain PRWP on EKG New onset AFRVR Peripheral vascular disease P:  Diltiazem infusion to maintain HR 70-115, also on amio per cards   RENAL Lab Results  Component Value Date   CREATININE 1.57* 08/18/2013   CREATININE 0.70 08/17/2013   CREATININE 0.79 08/16/2013    Recent Labs Lab 08/16/13 1630 08/17/13 0305 08/18/13 0527  K 4.2 4.2 4.8     A: New renal failure /insuff AKIN - 2 on 08/18/13 (Staff MD comment) P:   Monitor BMET intermittently Correct  electrolytes as indicated  GASTROINTESTINAL A:  Chronic PPI use P:   SUP: Cont home dose of pantoprazole  HEMATOLOGIC A:  Thrombocytopenia, unknown chronicity P:  Monitor CBC intermittently  INFECTIOUS A:   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    Richardson Landry Minor ACNP Maryanna Shape PCCM Pager [phone] till 3 pm If no answer page [phone] 08/18/2013, 8:39 AM   STAFF NOTE: I, Dr Ann Lions have personally reviewed patient's available data, including medical history, events of note, physical examination and test results as part of my evaluation. I have discussed with resident/NP and other care providers such as pharmacist, RN and RRT.  In addition,  I personally evaluated patient and elicited key findings of acute resp failure due to A Fib and ? AECOPD. Await REsp virus PCR. Contineu mgmt. Should try SBT ttoday. No family at bedside. Se my comments above as well.  Rest per NP/medical resident whose note is outlined above and that I agree with  The patient is critically ill with multiple organ systems failure and requires high complexity decision making for assessment and support, frequent evaluation and titration of therapies, application of advanced monitoring technologies and extensive interpretation of multiple databases.   Critical Care Time devoted to patient care services described in this note is  31  Minutes indpendent of NP  time  Dr. Brand Males, M.D., [facility].C.P Pulmonary and Critical Care Medicine Staff Physician [facility] Pager: [phone], If no answer or between  15:00h - 7:00h: call [phone]  08/18/2013 10:13 AM

## 2013-08-18 NOTE — Significant Event (Signed)
Pt remains hypotensive and oliguric.  Will give additional 750 ml NS bolus.  Chesley Mires, MD 08/18/2013, 5:04 PM

## 2013-08-18 NOTE — Progress Notes (Addendum)
Notified Elink MD of pt's continued hypotension with SBP in the 80s and oliguria. Pt has had 36ml UO from 0700 to 1645. Elink MD ordered a 733ml bolus. Will continue to monitor.  Evorn Gong, RN/BSN

## 2013-08-18 NOTE — Progress Notes (Signed)
Patient Name: [name] Date of Encounter: 08/18/2013  Principal Problem:   Acute on chronic respiratory failure Active Problems:   Acute exacerbation of chronic obstructive pulmonary disease (COPD)   History of lung cancer, s/p RUL resection 2009 @ [place]   Mediastinal adenopathy   Smoker   Atrial fibrillation with RVR   PRWP on ECG   New onset atrial fibrillation   Chest tightness   Peripheral vascular disease   Vascular graft infection, on chronic doxycycline   Thrombocytopenia   Acute respiratory failure   Length of Stay: 2  SUBJECTIVE  The patient is intubated and sleeping.   CURRENT MEDS . antiseptic oral rinse  15 mL Mouth Rinse q12n4p  . aspirin  81 mg Oral Daily  . budesonide  0.25 mg Nebulization Q6H  . buPROPion  150 mg Oral BID  . chlorhexidine  15 mL Mouth Rinse BID  . clopidogrel  75 mg Oral Q breakfast  . doxycycline  100 mg Oral Q12H  . enoxaparin (LOVENOX) injection  40 mg Subcutaneous Q24H  . ipratropium  0.5 mg Nebulization Q6H  . levalbuterol  0.63 mg Nebulization Q6H  . methylPREDNISolone (SOLU-MEDROL) injection  80 mg Intravenous Q12H  . oseltamivir  75 mg Per Tube BID  . pantoprazole  40 mg Oral Q1200    OBJECTIVE  Filed Vitals:   08/18/13 0436 08/18/13 0500 08/18/13 0600 08/18/13 0633  BP:  130/77 119/84   Pulse:  94 86 90  Temp:  96.4 F (35.8 C)  96.4 F (35.8 C)  TempSrc:      Resp:  14 15 15   Height:      Weight:      SpO2: 98% 98% 96% 97%    Intake/Output Summary (Last 24 hours) at 08/18/13 0750 Last data filed at 08/18/13 0631  Gross per 24 hour  Intake 916.28 ml  Output   1035 ml  Net -118.72 ml   Filed Weights   08/16/13 2130 08/17/13 0400  Weight: 139 lb 5.3 oz (63.2 kg) 140 lb 6.9 oz (63.7 kg)    PHYSICAL EXAM  General: Pleasant, NAD. Neuro: Alert and oriented X 3. Moves all extremities spontaneously. Psych: Normal affect. HEENT:  Normal  Neck: Supple without bruits or JVD. Lungs:  Resp regular and  unlabored, crackles B/L. Heart: RRR no s3, s4, or murmurs. Abdomen: Soft, non-tender, non-distended, BS + x 4.  Extremities: No clubbing, cyanosis or edema. DP/PT/Radials 2+ and equal bilaterally.  Accessory Clinical Findings  CBC  Recent Labs  08/16/13 1630 08/17/13 0305 08/18/13 0527  WBC 8.1 5.3 21.1*  NEUTROABS 2.0  --  9.7*  HGB 12.7* 11.8* 13.5  HCT 38.0* 37.1* 42.4  MCV 91.8 94.2 96.1  PLT 100* 94* 242*   Basic Metabolic Panel  Recent Labs  08/17/13 0305 08/18/13 0527  NA 136* 136*  K 4.2 4.8  CL 103 102  CO2 17* 15*  GLUCOSE 211* 240*  BUN 17 29*  CREATININE 0.70 1.57*  CALCIUM 7.5* 8.0*  MG  --  2.0  PHOS  --  5.7*    Recent Labs  08/17/13 1149 08/17/13 2240 08/18/13 0527  TROPONINI <0.30 <0.30 <0.30   BNP No components found with this basename: POCBNP,  D-Dimer  Recent Labs  08/16/13 1630  DDIMER 2.92*    Recent Labs  08/17/13 1149  CHOL 125  HDL 32*  LDLCALC 85  TRIG 42  CHOLHDL 3.9    Radiology/Studies  Dg Chest Port 1  View  08/18/2013   CLINICAL DATA:  Acute on chronic respiratory failure. Endotracheal tube position.  EXAM: PORTABLE CHEST - 1 VIEW  COMPARISON:  08/17/2013  FINDINGS: Endotracheal tube tip is 16 mm above the carina and directed toward the right. Central venous catheter and NG tube appear in good position.  There is a new area of infiltrate or atelectasis at the left lung base. There is slight pulmonary vascular congestion which is new. Chronic changes throughout the right lung are stable.  IMPRESSION: 1. Endotracheal tube is 16 mm above the carina and directed toward the right. 2. New atelectasis/infiltrate at the left lung base. 3. New slight pulmonary vascular congestion.   Electronically Signed   By: Rozetta Nunnery M.D.   On: 08/18/2013 07:36   Study Conclusions  - Left ventricle: The cavity size was mildly dilated. Systolic function was severely reduced. The estimated ejection fraction was in the range of 15% to  20%. Diffuse hypokinesis. Doppler parameters are consistent with a reversible restrictive pattern, indicative of decreased left ventricular diastolic compliance and/or increased left atrial pressure (grade 3 diastolic dysfunction). - Mitral valve: Moderate regurgitation. - Left atrium: The atrium was mildly dilated. - Right ventricle: The cavity size was moderately dilated. Wall thickness was normal. Systolic function was mildly reduced. - Right atrium: The atrium was mildly dilated. - Tricuspid valve: Moderate regurgitation. - Pulmonary arteries: Systolic pressure was mildly increased. PA peak pressure: 44mm Hg (S).  TELE: a-fiv, rate controlled, in 32'  IMPRESSION:  1. Atrial fibrillation rapid ventricular response. The current episode has just started during this admission, but there is documentation of previous brief atrial fibrillation in the past. He is unaware of palpitations. It is possible that he has asymptomatic episodes at home. The current episode of exacerbation is likely linked to his acute respiratory decompensation. 2. Rate control has been difficult to do to relative hypotension 3. New diagnosis of acute systolic and diastolic CHF, LVEF 07-37%, diffuse hypokinesis  RECOMMENDATION:  1. Continue diltiazem at 5 mg an hour 2. Start amiodarone bolus and infusion Full dose anticoagulation for stroke prevention and recommend continuing anticoagulation as an outpatient. Since he receives benefits through the [place] system, warfarin is likely to be our best option.  3. Unable to treat for CHF right now, once COPD exac and AKI resolve we will start ACEI and BB, he is mildly fluid overloaded, we will give 1 dose of lasix and follow 4. it is highly likely that he does have coronary artery disease, but at this point there is no reason to suspect acute coronary insufficiency.  5. in view of his extensive peripheral arterial disease problems, it is surprising that he is not receiving a  statin. We'll check a lipid profile   Signed, Ena Dawley, H MD, [facility] 08/18/2013

## 2013-08-18 NOTE — Progress Notes (Signed)
ANTIBIOTIC CONSULT NOTE - INITIAL  Pharmacy Consult for Vancomycin and Zosyn Indication: rule out sepsis  Allergies  Allergen Reactions  . Lactose Intolerance (Gi)   . Shellfish Allergy Diarrhea and Nausea And Vomiting  . Strawberry Rash    Patient Measurements: Height: 5\' 9"  (175.3 cm) Weight: 140 lb 6.9 oz (63.7 kg) IBW/kg (Calculated) : 70.7   Vital Signs: Temp: 90.1 F (32.3 C) (01/29 1900) BP: 90/63 mmHg (01/29 2003) Pulse Rate: 58 (01/29 2003) Intake/Output from previous day: 01/28 0701 - 01/29 0700 In: 916.3 [I.V.:584.1; NG/GT:222.2] Out: 1035 [Urine:1035] Intake/Output from this shift:    Labs:  Recent Labs  08/16/13 1630 08/17/13 0305 08/18/13 0527  WBC 8.1 5.3 21.1*  HGB 12.7* 11.8* 13.5  PLT 100* 94* 136*  CREATININE 0.79 0.70 1.57*   Estimated Creatinine Clearance: 37.8 ml/min (by C-G formula based on Cr of 1.57). No results found for this basename: VANCOTROUGH, 08/20/13, VANCORANDOM, GENTTROUGH, GENTPEAK, GENTRANDOM, TOBRATROUGH, TOBRAPEAK, TOBRARND, AMIKACINPEAK, AMIKACINTROU, AMIKACIN,  in the last 72 hours   Microbiology: Recent Results (from the past 720 hour(s))  CULTURE, BLOOD (ROUTINE X 2)     Status: None   Collection Time    08/16/13  7:37 PM      Result Value Range Status   Specimen Description BLOOD RIGHT ARM   Final   Special Requests BOTTLES DRAWN AEROBIC AND ANAEROBIC 6CC   Final   Culture  Setup Time     Final   Value: 08/16/2013 22:39     Performed at 08/18/2013   Culture     Final   Value:        BLOOD CULTURE RECEIVED NO GROWTH TO DATE CULTURE WILL BE HELD FOR 5 DAYS BEFORE ISSUING A FINAL NEGATIVE REPORT     Performed at [facility]   Report Status PENDING   Incomplete  MRSA PCR SCREENING     Status: None   Collection Time    08/16/13 11:01 PM      Result Value Range Status   MRSA by PCR NEGATIVE  NEGATIVE Final   Comment:            The GeneXpert MRSA Assay (FDA     approved for NASAL specimens    only), is one component of a     comprehensive MRSA colonization     surveillance program. It is not     intended to diagnose MRSA     infection nor to guide or     monitor treatment for     MRSA infections.  RESPIRATORY VIRUS PANEL     Status: None   Collection Time    08/17/13  2:52 PM      Result Value Range Status   Source - RVPAN NASOPHARYNGEAL SWAB   Corrected   Comment: CORRECTED ON 01/29 AT 1946: PREVIOUSLY REPORTED AS NASOPHARYNGEAL SWAB   Respiratory Syncytial Virus A NOT DETECTED   Final   Respiratory Syncytial Virus B NOT DETECTED   Final   Influenza A NOT DETECTED   Final   Influenza B NOT DETECTED   Final   Parainfluenza 1 NOT DETECTED   Final   Parainfluenza 2 NOT DETECTED   Final   Parainfluenza 3 NOT DETECTED   Final   Metapneumovirus NOT DETECTED   Final   Rhinovirus NOT DETECTED   Final   Adenovirus NOT DETECTED   Final   Influenza A H1 NOT DETECTED   Final   Influenza A H3 NOT  DETECTED   Final   Comment: (NOTE)           Normal Reference Range for each Analyte: NOT DETECTED     Testing performed using the Luminex xTAG Respiratory Viral Panel test     kit.     This test was developed and its performance characteristics determined     by [facility]. It has not been cleared or approved by the [place]     Food and Drug Administration. This test is used for clinical purposes.     It should not be regarded as investigational or for research. This     laboratory is certified under the [place] (CLIA) as qualified to perform high complexity     clinical laboratory testing.     Performed at [place], RESPIRATORY (NON-EXPECTORATED)     Status: None   Collection Time    08/18/13  9:01 AM      Result Value Range Status   Specimen Description TRACHEAL ASPIRATE   Final   Special Requests Normal   Final   Gram Stain     Final   Value: RARE WBC PRESENT,BOTH PMN AND MONONUCLEAR     NO SQUAMOUS  EPITHELIAL CELLS SEEN     NO ORGANISMS SEEN     Performed at [facility]   Culture PENDING   Incomplete   Report Status PENDING   Incomplete    Medical History: Past Medical History  Diagnosis Date  . Coronary artery disease   . Cancer     lung    Medications:  Scheduled:  . antiseptic oral rinse  15 mL Mouth Rinse q12n4p  . aspirin  81 mg Oral Daily  . budesonide  0.25 mg Nebulization Q6H  . buPROPion  150 mg Oral BID  . chlorhexidine  15 mL Mouth Rinse BID  . clopidogrel  75 mg Oral Q breakfast  . enoxaparin (LOVENOX) injection  40 mg Subcutaneous Q24H  . ipratropium  0.5 mg Nebulization Q6H  . levalbuterol  0.63 mg Nebulization Q6H  . methylPREDNISolone (SOLU-MEDROL) injection  80 mg Intravenous Q12H  . [START ON 08/19/2013] oseltamivir  75 mg Per Tube Daily  . pantoprazole  40 mg Oral Q1200   Anti-infectives   Start     Dose/Rate Route Frequency Ordered Stop   08/19/13 1000  oseltamivir (TAMIFLU) 6 MG/ML suspension 75 mg     75 mg Per Tube Daily 08/18/13 1128 08/22/13 0959   08/17/13 2200  oseltamivir (TAMIFLU) 6 MG/ML suspension 75 mg  Status:  Discontinued     75 mg Per Tube 2 times daily 08/17/13 2149 08/18/13 1128   08/17/13 1200  oseltamivir (TAMIFLU) capsule 75 mg  Status:  Discontinued     75 mg Oral 2 times daily 08/17/13 1052 08/17/13 2148   08/16/13 2200  doxycycline (VIBRA-TABS) tablet 100 mg  Status:  Discontinued     100 mg Oral Every 12 hours 08/16/13 2011 08/18/13 2042   08/16/13 1900  levofloxacin (LEVAQUIN) IVPB 750 mg     750 mg 100 mL/hr over 90 Minutes Intravenous  Once 08/16/13 1851 08/16/13 2156     Assessment: 54 yoM admitted 1/27 PM with acute exacerbation of COPD with acute respiratory insufficiency. He also developed Afib with RVR. Given Levaquin x 1, added Tamiflu empirically, and cont chronic doxycycline as PTA. 1/29 broadening abx to Vanc and Zosyn for sepsis(continued hypotension and increased abdominal  distension).  1/27 >>  Levaquin $RemoveB'750mg'Yckuoray$  IV x 1 1/27 >> Doxycycline $RemoveBefore'100mg'KfmMslhhhKICd$  BID >> 1/29 1/28 >> Tamiflu >> 1/29 >> Zosyn >> 1/29 >> Vanc >>  Tmax: hypothermic WBCs: WNL > 21.1 (solumedrol) Renal: SCr 0.7 > 1.57, CrCl ~ 38  1/27 blood: ngtd 1/27 MRSA PCR: negative 1/28 Influenza PCR: negative 1/28 Respiratory virus panel: negative 1/28 Trach aspirate: in process  Goal of Therapy:  Vancomycin trough level 15-20 mcg/ml Regimen appropriate for renal function Eradication of infection  Plan:   Vancomycin 1g IV q24h.  Zosyn 3.375g IV Q8H infused over 4hrs.  Measure Vanc trough at steady state.  Follow up renal fxn and culture results.  Consider stopping Tamiflu since influenza panel was negative.  Romeo Rabon, PharmD, pager [phone]. 08/18/2013,8:55 PM.

## 2013-08-18 NOTE — Progress Notes (Signed)
Pt transported to and from CT without incident.  RT to monitor and assess as needed.  

## 2013-08-18 NOTE — Progress Notes (Signed)
CARE MANAGEMENT NOTE 08/18/2013  Patient:  BRITTIN, JANIK   Account Number:  [id number]  Date Initiated:  08/17/2013  Documentation initiated by:  Melainie Krinsky  Subjective/Objective Assessment:   pt admitted from home due to increased dyspnea,weakness and palpations, found to be in a.fib     Action/Plan:   home when stable   Anticipated DC Date:  08/20/2013   Anticipated DC Plan:  HOME/SELF CARE  In-house referral  NA      DC Planning Services  NA      [facility] Choice  NA   Choice offered to / List presented to:  NA   DME arranged  NA      DME agency  NA     [place] arranged  NA      [place] agency  NA   Status of service:  In process, will continue to follow Medicare Important Message given?  NA - LOS <3 / Initial given by admissions (If response is "NO", the following Medicare IM given date fields will be blank) Date Medicare IM given:   Date Additional Medicare IM given:    Discharge Disposition:    Per UR Regulation:  Reviewed for med. necessity/level of care/duration of stay  If discussed at [place] of Stay Meetings, dates discussed:    Comments:  [id number]/Sharon Stapel Rosana Hoes RN, BSN, [place], [phone] Chart reviewed for update of needs and condition. Intubated and on sedation, dilt and amio drips per cards.  [id number] Rosana Hoes, RN, BSN, CCM [phone] Chart Reviewed for discharge and hospital needs. Discharge needs at time of review:  None present will follow for needs. Review of patient progress due on [id number].

## 2013-08-18 NOTE — Progress Notes (Signed)
Bladder scan revealed 0 ml urine in pt bladder. I irrigated foley with 40 ml saline and got 50 ml output. Pt is now hypotensive with BP in 62V systolic. Notified CCM NP. NP ordered 500 ml bolus. Will continue to monitor.  Evorn Gong, RN/BSN

## 2013-08-18 NOTE — Progress Notes (Addendum)
S:  Called to bedside by E-Link MD to assess patient for hypotension / ? Abdominal process - elevated gastric residual, wbc, elevated intra-abdominal pressure, .  O: Filed Vitals:   08/18/13 1812 08/18/13 1816 08/18/13 1900 08/18/13 2003  BP: 109/56 82/31 97/68  90/63  Pulse: 61 61 59 58  Temp: 97.5 F (36.4 C) 97.5 F (36.4 C) 90.1 F (32.3 C)   TempSrc:      Resp: 17 18 0 14  Height:      Weight:      SpO2: 95% 94% 95% 100%   General: elderly male in NAD on vent Neuro: sedate, arouses to name to open eyes, then drifts back to sleep, MAE CV: s1s2 rrr, no m/r/g, SB on monitor PULM: resp's even/non-labored on vent, lungs bilaterally coarse GI: flat, bsx4 active, firm to exam but no tenderness with palpation noted Extremities: warm/dry, no edema   A: Hypotension Sepsis - unclear source Acute Kidney Injury Thrombocytopenia   P: -assess CT abd now -assess lactate, CMP -escalate antibiotics -minimize fentanyl gtt as able -hold TF until am, CBG Q4 -repeat ABG -assess rectal temp >foley flushed earlier and temp inaccurate -respiratory viral panel & flu negative, d/c droplet precautions -assess troponin x1 (prior negative) -neosynephrine / vasopression gtt for MAP >65 -add bicarb to MIVF -initiated code status discussion with wife, at present, she wants to continue with full code   Additional CC Time:  30 minutes.  Family updated on patients status.   Daniel Gens, NP-C [facility] Pgr: [phone] or [phone]    Highly suspect ischemic bowel - either from mesenteric vascular disease and hypotension or embolic disease from AF. CT abdomen results pending. He has urgent need for CRRT due to anuric renal failure, acidosis and hyperkalemia. Orders for transfer to [facility] entered. Will place HD cath. Also A-line in setting of increasing vasopressor requirements. Renal consult to be called. Wife will be updated. Prognosis seems very poor.    Daniel Border, MD  ; [facility] [phone].  After 5:30 PM or weekends, call [phone]

## 2013-08-18 NOTE — Progress Notes (Signed)
Notified CCM NP of pt's oliguria over course of shift. From 0700 to 1300, pt has had 3ml UO. CCM ordered bladder scan. Will continue to monitor.  Evorn Gong, RN

## 2013-08-19 ENCOUNTER — Inpatient Hospital Stay (HOSPITAL_COMMUNITY): Payer: Medicare Other

## 2013-08-19 DIAGNOSIS — D696 Thrombocytopenia, unspecified: Secondary | ICD-10-CM

## 2013-08-19 DIAGNOSIS — A419 Sepsis, unspecified organism: Secondary | ICD-10-CM

## 2013-08-19 DIAGNOSIS — N179 Acute kidney failure, unspecified: Secondary | ICD-10-CM

## 2013-08-19 DIAGNOSIS — R6521 Severe sepsis with septic shock: Secondary | ICD-10-CM

## 2013-08-19 DIAGNOSIS — R6520 Severe sepsis without septic shock: Secondary | ICD-10-CM

## 2013-08-19 LAB — CBC WITH DIFFERENTIAL/PLATELET
BASOS PCT: 0 % (ref 0–1)
Basophils Absolute: 0 10*3/uL (ref 0.0–0.1)
EOS ABS: 0 10*3/uL (ref 0.0–0.7)
EOS PCT: 0 % (ref 0–5)
HCT: 38.9 % — ABNORMAL LOW (ref 39.0–52.0)
Hemoglobin: 12.5 g/dL — ABNORMAL LOW (ref 13.0–17.0)
LYMPHS ABS: 9.6 10*3/uL — AB (ref 0.7–4.0)
Lymphocytes Relative: 33 % (ref 12–46)
MCH: 30.7 pg (ref 26.0–34.0)
MCHC: 32.1 g/dL (ref 30.0–36.0)
MCV: 95.6 fL (ref 78.0–100.0)
MONO ABS: 2.6 10*3/uL — AB (ref 0.1–1.0)
Monocytes Relative: 9 % (ref 3–12)
NEUTROS PCT: 58 % (ref 43–77)
Neutro Abs: 16.9 10*3/uL — ABNORMAL HIGH (ref 1.7–7.7)
Platelets: 113 10*3/uL — ABNORMAL LOW (ref 150–400)
RBC: 4.07 MIL/uL — AB (ref 4.22–5.81)
RDW: 15.1 % (ref 11.5–15.5)
WBC: 29.1 10*3/uL — ABNORMAL HIGH (ref 4.0–10.5)

## 2013-08-19 LAB — POCT I-STAT 3, ART BLOOD GAS (G3+)
ACID-BASE DEFICIT: 2 mmol/L (ref 0.0–2.0)
Acid-base deficit: 3 mmol/L — ABNORMAL HIGH (ref 0.0–2.0)
BICARBONATE: 24.1 meq/L — AB (ref 20.0–24.0)
Bicarbonate: 23.2 mEq/L (ref 20.0–24.0)
O2 SAT: 94 %
O2 Saturation: 94 %
PCO2 ART: 51.2 mmHg — AB (ref 35.0–45.0)
PO2 ART: 70 mmHg — AB (ref 80.0–100.0)
Patient temperature: 97.8
Patient temperature: 97
TCO2: 26 mmol/L (ref 0–100)
TCO2: 24 mmol/L (ref 0–100)
pCO2 arterial: 39.7 mmHg (ref 35.0–45.0)
pH, Arterial: 7.279 — ABNORMAL LOW (ref 7.350–7.450)
pH, Arterial: 7.37 (ref 7.350–7.450)
pO2, Arterial: 78 mmHg — ABNORMAL LOW (ref 80.0–100.0)

## 2013-08-19 LAB — RENAL FUNCTION PANEL
ALBUMIN: 2.7 g/dL — AB (ref 3.5–5.2)
BUN: 27 mg/dL — ABNORMAL HIGH (ref 6–23)
CHLORIDE: 97 meq/L (ref 96–112)
CO2: 23 mEq/L (ref 19–32)
Calcium: 6.7 mg/dL — ABNORMAL LOW (ref 8.4–10.5)
Creatinine, Ser: 1.66 mg/dL — ABNORMAL HIGH (ref 0.50–1.35)
GFR calc Af Amer: 46 mL/min — ABNORMAL LOW (ref >90)
GFR, EST NON AFRICAN AMERICAN: 39 mL/min — AB (ref >90)
Glucose, Bld: 153 mg/dL — ABNORMAL HIGH (ref 70–99)
Phosphorus: 2.7 mg/dL (ref 2.3–4.6)
Potassium: 4.2 mEq/L (ref 3.7–5.3)
SODIUM: 133 meq/L — AB (ref 137–147)

## 2013-08-19 LAB — GLUCOSE, CAPILLARY
GLUCOSE-CAPILLARY: 165 mg/dL — AB (ref 70–99)
GLUCOSE-CAPILLARY: 161 mg/dL — AB (ref 70–99)
Glucose-Capillary: 242 mg/dL — ABNORMAL HIGH (ref 70–99)
Glucose-Capillary: 174 mg/dL — ABNORMAL HIGH (ref 70–99)
Glucose-Capillary: 183 mg/dL — ABNORMAL HIGH (ref 70–99)
Glucose-Capillary: 165 mg/dL — ABNORMAL HIGH (ref 70–99)
Glucose-Capillary: 119 mg/dL — ABNORMAL HIGH (ref 70–99)

## 2013-08-19 LAB — URINE MICROSCOPIC-ADD ON

## 2013-08-19 LAB — PROTIME-INR
INR: 2.48 — AB (ref 0.00–1.49)
PROTHROMBIN TIME: 26 s — AB (ref 11.6–15.2)

## 2013-08-19 LAB — HEPATITIS PANEL, ACUTE
HCV AB: NEGATIVE
Hep A IgM: NONREACTIVE
Hep B C IgM: NONREACTIVE
Hepatitis B Surface Ag: NEGATIVE

## 2013-08-19 LAB — LIPASE, BLOOD: LIPASE: 46 U/L (ref 11–59)

## 2013-08-19 LAB — URINALYSIS, ROUTINE W REFLEX MICROSCOPIC
Bilirubin Urine: NEGATIVE
GLUCOSE, UA: NEGATIVE mg/dL
Ketones, ur: NEGATIVE mg/dL
LEUKOCYTES UA: NEGATIVE
Nitrite: NEGATIVE
PH: 5 (ref 5.0–8.0)
Protein, ur: 30 mg/dL — AB
SPECIFIC GRAVITY, URINE: 1.016 (ref 1.005–1.030)
Urobilinogen, UA: 0.2 mg/dL (ref 0.0–1.0)

## 2013-08-19 LAB — COMPREHENSIVE METABOLIC PANEL
ALBUMIN: 2.5 g/dL — AB (ref 3.5–5.2)
ALT: 829 U/L — ABNORMAL HIGH (ref 0–53)
AST: 936 U/L — ABNORMAL HIGH (ref 0–37)
Alkaline Phosphatase: 60 U/L (ref 39–117)
BUN: 39 mg/dL — ABNORMAL HIGH (ref 6–23)
CALCIUM: 7 mg/dL — AB (ref 8.4–10.5)
CO2: 18 mEq/L — ABNORMAL LOW (ref 19–32)
CREATININE: 2.61 mg/dL — AB (ref 0.50–1.35)
Chloride: 100 mEq/L (ref 96–112)
GFR calc Af Amer: 26 mL/min — ABNORMAL LOW (ref >90)
GFR calc non Af Amer: 23 mL/min — ABNORMAL LOW (ref >90)
Glucose, Bld: 209 mg/dL — ABNORMAL HIGH (ref 70–99)
Potassium: 4.4 mEq/L (ref 3.7–5.3)
Sodium: 135 mEq/L — ABNORMAL LOW (ref 137–147)
TOTAL PROTEIN: 4.6 g/dL — AB (ref 6.0–8.3)
Total Bilirubin: 0.7 mg/dL (ref 0.3–1.2)

## 2013-08-19 LAB — TSH: TSH: 1.998 u[IU]/mL (ref 0.350–4.500)

## 2013-08-19 LAB — LACTIC ACID, PLASMA: Lactic Acid, Venous: 2.4 mmol/L — ABNORMAL HIGH (ref 0.5–2.2)

## 2013-08-19 LAB — APTT: aPTT: 51 seconds — ABNORMAL HIGH (ref 24–37)

## 2013-08-19 LAB — HEPARIN LEVEL (UNFRACTIONATED)
Heparin Unfractionated: 0.51 IU/mL (ref 0.30–0.70)
Heparin Unfractionated: 0.56 IU/mL (ref 0.30–0.70)

## 2013-08-19 LAB — PHOSPHORUS: PHOSPHORUS: 6.9 mg/dL — AB (ref 2.3–4.6)

## 2013-08-19 LAB — MAGNESIUM: Magnesium: 1.7 mg/dL (ref 1.5–2.5)

## 2013-08-19 LAB — PRO B NATRIURETIC PEPTIDE: PRO B NATRI PEPTIDE: 22750 pg/mL — AB (ref 0–125)

## 2013-08-19 MED ORDER — ETOMIDATE 2 MG/ML IV SOLN
0.3000 mg/kg | Freq: Once | INTRAVENOUS | Status: AC
  Administered 2013-08-19: 20 mg via INTRAVENOUS
  Filled 2013-08-19: qty 10.68

## 2013-08-19 MED ORDER — ETOMIDATE 2 MG/ML IV SOLN
INTRAVENOUS | Status: AC
  Filled 2013-08-19: qty 10

## 2013-08-19 MED ORDER — SODIUM CHLORIDE 0.45 % IV SOLN
INTRAVENOUS | Status: DC
  Administered 2013-08-19: 20 mL/h via INTRAVENOUS
  Administered 2013-08-20: 21:00:00 via INTRAVENOUS
  Administered 2013-08-23 – 2013-08-25 (×2): 20 mL/h via INTRAVENOUS
  Administered 2013-08-28: 15:00:00 via INTRAVENOUS

## 2013-08-19 MED ORDER — PIPERACILLIN-TAZOBACTAM 3.375 G IVPB 30 MIN
3.3750 g | Freq: Four times a day (QID) | INTRAVENOUS | Status: DC
  Administered 2013-08-19 – 2013-08-21 (×9): 3.375 g via INTRAVENOUS
  Filled 2013-08-19 (×10): qty 50

## 2013-08-19 NOTE — Progress Notes (Signed)
Name: [name] MRN: [id number] DOB: [birth year]    ADMISSION DATE:  08/16/2013  REFERRING MD :  EDP PRIMARY SERVICE: PCCM  CHIEF COMPLAINT:  Dyspnea  BRIEF PATIENT DESCRIPTION:   61 M with COPD, hx of lung ca, PVD, current smoker presented to [facility] ED with 2 wks of progressive dyspnea and chest tightness minimally and transiently responsive to albuterol MDI. Developed afib rvr, abdominal process,  resp failure, ETT placed and renal failure, sepsis.  LINES / TUBES: 1/28 OTT>> 1/28 l i j cvl>> Rt fem aline 1/29>>> 1/29 rt ij hd>>>  CULTURES: Blood 1/27 >>  resp virus 1/28 >> Neg Flu pcr 1/28 >> NEG 1/29 sputum>>  ANTIBIOTICS: Doxycycline (chronic)>>> Held 1/29 tamiflu 1/28 (ongoing till resp virus multiplex pcr results return) >> 1/29 1/29 vanc>>> 1/29 zosyn>>>  SIGNIFICANT EVENTS / STUDIES:  1/27 CT chest: Negative for PE.  Postop changes right upper lobectomy  Interval increased mediastinal adenopathy. Largest lymph nodes in the left prevascular/ AP window space and the right paratracheal region.  Small chronic residual right subpulmonic effusion with small loculated components and pleural thickening all appearing chronic.  Centrilobular emphysema.   1/28 intubated 1/29 Transferred to [facility]  1/30 cvvhd  SUBJECTIVE/OVERNIGHT/INTERVAL HX- on HD  VITAL SIGNS: Temp:  [90.1 F (32.3 C)-98 F (36.7 C)] 98 F (36.7 C) (01/30 1015) Pulse Rate:  [47-105] 83 (01/30 1015) Resp:  [0-26] 16 (01/30 1015) BP: (46-114)/(23-86) 93/42 mmHg (01/30 1000) SpO2:  [86 %-100 %] 93 % (01/30 1015) Arterial Line BP: (102-124)/(38-46) 107/42 mmHg (01/30 1015) FiO2 (%):  [40 %] 40 % (01/30 0900) Weight:  [156 lb 15.5 oz (71.2 kg)] 156 lb 15.5 oz (71.2 kg) (01/30 0500) HEMODYNAMICS: CVP:  [16 mmHg-25 mmHg] 16 mmHg VENTILATOR SETTINGS: Vent Mode:  [-] PRVC FiO2 (%):  [40 %] 40 % Set Rate:  [14 bmp-16 bmp] 16 bmp Vt Set:  [500 mL] 500 mL PEEP:  [5 cmH20] 5 cmH20 Plateau Pressure:  [14  cmH20-20 cmH20] 14 cmH20 INTAKE / OUTPUT: Intake/Output     01/29 0701 - 01/30 0700 01/30 0701 - 01/31 0700   I.V. (mL/kg) 3284.2 (46.1) 451.9 (6.3)   Other     NG/GT 330    IV Piggyback 860 25   Total Intake(mL/kg) 4474.2 (62.8) 476.9 (6.7)   Urine (mL/kg/hr) 140 (0.1) 60 (0.3)   Emesis/NG output 400 (0.2)    Other 149 (0.1) 407 (1.7)   Total Output 689 467   Net +3785.2 +9.9          PHYSICAL EXAMINATION: General:  Sedated on vent, RASS- -1, HD ongoing, warming blankets present. Neuro:  Sedated.   HEENT:  EOMI, PERRL OTT-Vent, OGT Cardiovascular: Irreg rate and rhythm, tachy, no M Lungs: diffuse coarse breath sounds.  Abdomen: Soft, no tenderness,  +bs Ext: warm, no edema, diminished DP pulses  LABS: PULMONARY  Recent Labs Lab 08/17/13 1115 08/17/13 1535 08/18/13 0515 08/18/13 2105  PHART 7.394 7.353 7.206* 7.119*  PCO2ART 30.5* 34.2* 42.3 42.0  PO2ART 89.6 322.0* 101.0* 84.8  HCO3 18.2* 18.5* 16.1* 13.2*  TCO2 16.6 16.9 14.9 12.7  O2SAT 97.1 99.5 96.0 94.0    CBC  Recent Labs Lab 08/18/13 0527 08/18/13 2140 08/19/13 0320  HGB 13.5 13.5 12.5*  HCT 42.4 43.7 38.9*  WBC 21.1* 28.6* 29.1*  PLT 136* 120* 113*    COAGULATION  Recent Labs Lab 08/18/13 0025  INR 2.48*    CARDIAC    Recent Labs Lab 08/17/13 0305  08/17/13 1149 08/17/13 2240 08/18/13 0527 08/18/13 2140  TROPONINI <0.30 <0.30 <0.30 <0.30 <0.30    Recent Labs Lab 08/17/13 0305 08/19/13 0320  PROBNP 2840.0* 22750.0*     CHEMISTRY  Recent Labs Lab 08/16/13 1630 08/17/13 0305 08/18/13 0527 08/18/13 2140 08/19/13 0320  NA 138 136* 136* 132* 135*  K 4.2 4.2 4.8 6.3* 4.4  CL 103 103 102 100 100  CO2 22 17* 15* 17* 18*  GLUCOSE 117* 211* 240* 298* 209*  BUN 23 17 29* 39* 39*  CREATININE 0.79 0.70 1.57* 2.53* 2.61*  CALCIUM 8.6 7.5* 8.0* 7.0* 7.0*  MG  --   --  2.0  --  1.7  PHOS  --   --  5.7*  --  6.9*   Estimated Creatinine Clearance: 25.2 ml/min (by C-G  formula based on Cr of 2.61).   LIVER  Recent Labs Lab 08/18/13 0025 08/18/13 2140 08/19/13 0320  AST  --  160* 936*  ALT  --  172* 829*  ALKPHOS  --  65 60  BILITOT  --  0.6 0.7  PROT  --  5.0* 4.6*  ALBUMIN  --  2.7* 2.5*  INR 2.48*  --   --      INFECTIOUS  Recent Labs Lab 08/16/13 1953 08/18/13 2140  LATICACIDVEN 2.05 3.1*     ENDOCRINE CBG (last 3)   Recent Labs  08/19/13 0126 08/19/13 0511 08/19/13 0808  GLUCAP 174* 183* 165*    IMAGING x48h  Ct Abdomen Pelvis Wo Contrast  08/18/2013   CLINICAL DATA:  Assess for intra-abdominal infection  EXAM: CT ABDOMEN AND PELVIS WITHOUT CONTRAST  TECHNIQUE: Multidetector CT imaging of the abdomen and pelvis was performed following the standard protocol without intravenous contrast.  COMPARISON:  Prior radiograph from 08/18/2013  FINDINGS: A moderate left pleural effusion with associated compressive atelectasis is present. Small right pleural effusion with associated atelectasis is also noted. No definite focal infiltrate seen within the partially visualized lung bases. Emphysematous changes noted.  Cardiomegaly partially visualized. No pericardial effusion. A few scattered atherosclerotic plaques noted within the partially visualized left anterior descending coronary artery.  Limited noncontrast evaluation liver is grossly unremarkable. Hyperdensity is seen within the nondilated gallbladder, which may represent vicarious excretion of contrast. More hyperdense layering stones are seen within the gallbladder lumen. There is prominent gallbladder wall thickening with small amount of pericholecystic fluid. No definite biliary ductal dilatation identified.  The spleen is within normal limits. Adrenal glands are unremarkable. Limited noncontrast evaluation of the pancreas is grossly unremarkable.  Enteric tube is coiled within the stomach. There is a small amount of enteric contrast material within the gastric lumen and proximal small  bowel. There is question of circumferential bladder wall thickening within the second, third, and fourth portions of the duodenum (series 2, image 42). This finding is somewhat poorly evaluated due to small amount of enteric contrast as well as lack of IV contrast, and may be in part related to incomplete distension. No evidence of bowel obstruction. Scattered colonic diverticula are present without definite evidence of acute diverticulitis. No pneumatosis or portal venous gas identified. No definite inflammatory changes seen about the bowels, although evaluation is limited due to lack of IV contrast. Appendix is normal.  The bladder is decompressed with a Foley catheter in place small amount of gas lucency within the nondependent portion of the bladder lumen is most compatible with catheterization. Prostate is normal.  Moderate volume ascites is present within the abdomen. No definite  loculated fluid collection identified. No free air identified.  Vascular stents noted at the origin of the celiac access in within the proximal SMA. Additional iliac stents noted as well. Surgical clips seen about the common femoral arteries bilaterally. 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 Soft tissue density within the adjacent. Soft tissues is likely postsurgical in nature.  No enlarged intra-abdominal pelvic lymph nodes are identified.  Diffuse anasarca is noted. Small focus of subcutaneous emphysema noted within the subcutaneous fat of the anterior mid abdomen (series 2, image 46), which may be iatrogenic.  Multilevel degenerative disc disease noted within the visualized spine with prominent facet arthrosis at L3 through S1.  IMPRESSION: 1. Question circumferential bowel wall thickening versus incomplete distention within the duodenum as above, nonspecific, but may represent sequelae of infection or inflammation. Possible ischemic bowel could also be considered in the  correct clinical setting. No portal venous gas, free air, or pneumatosis identified. 2. Moderate volume ascites. No definite loculated fluid collection identified. 3. Left greater than right bilateral pleural effusions with associated bibasilar atelectasis. 4. Cholelithiasis with circumferential gallbladder wall thickening and/or pericholecystic fluid. Finding may be related to overall volume status. Clinical correlation and/or right upper quadrant ultrasound could be performed if there is clinical concern for acute cholecystitis. No biliary ductal dilatation identified. 5. Colonic diverticulosis without acute diverticulitis. 6. 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 7. 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 8. Moderate cardiomegaly. 9. Diffuse anasarca.   Electronically Signed   By: Jeannine Boga M.D.   On: 08/18/2013 23:49   Dg Chest Port 1 View  08/19/2013   CLINICAL DATA:  Line placement  EXAM: PORTABLE CHEST - 1 VIEW  COMPARISON:  08/18/2013  FINDINGS: New right IJ temporary dialysis time catheter noted, tip mid SVC. Endotracheal to 3 cm above the carina. NG tube extends into the stomach. Cardiomegaly evident with vascular congestion and dense left lower lobe collapse/consolidation. Postop changes in the right lung with associated volume loss. No enlarging effusion or pneumothorax.  IMPRESSION: New right IJ temporary dialysis catheter, tip mid SVC level  No pneumothorax.  No other significant change   Electronically Signed   By: Daryll Brod M.D.   On: 08/19/2013 01:20   Dg Chest Port 1 View  08/18/2013   CLINICAL DATA:  Acute on chronic respiratory failure. Endotracheal tube position.  EXAM: PORTABLE CHEST - 1 VIEW  COMPARISON:  08/17/2013  FINDINGS: Endotracheal tube tip is 16 mm above the carina and directed toward the right. Central venous catheter and NG tube appear in good  position.  There is a new area of infiltrate or atelectasis at the left lung base. There is slight pulmonary vascular congestion which is new. Chronic changes throughout the right lung are stable.  IMPRESSION: 1. Endotracheal tube is 16 mm above the carina and directed toward the right. 2. New atelectasis/infiltrate at the left lung base. 3. New slight pulmonary vascular congestion.   Electronically Signed   By: Rozetta Nunnery M.D.   On: 08/18/2013 07:36   Dg Chest Port 1 View  08/17/2013   CLINICAL DATA:  Central line and endotracheal tube placement. Acute on chronic respiratory failure. COPD.  EXAM: PORTABLE CHEST - 1 VIEW 3:09 p.m.  COMPARISON:  08/17/2013 at 5:13 a.m.  FINDINGS: Endotracheal tube tip is 2.4 cm above the carina. Left  jugular vein catheter tip is in the superior vena cava.  There is chronic elevation of the right hemidiaphragm with chronic blunting of the right costophrenic angle. There is pulmonary vascular congestion. No effusions.  IMPRESSION: Endotracheal tube and central line in good position. New pulmonary vascular congestion.   Electronically Signed   By: Rozetta Nunnery M.D.   On: 08/17/2013 15:28   Dg Abd Portable 1v  08/18/2013   CLINICAL DATA:  Abdominal distension  EXAM: PORTABLE ABDOMEN - 1 VIEW  COMPARISON:  None.  FINDINGS: Nonobstructive bowel gas pattern. Unremarkable colonic stool burden. Nondiagnostic evaluation for pneumoperitoneum secondary to supine positioning and exclusion of the lower thorax. No definite pneumatosis or portal venous gas. Enteric tube tip and side port overlie the expected location of the gastric fundus.  Vascular stents overlie the expected location of the left common iliac artery, SMA and possibly the celiac artery. Multiple surgical clips overlie the bilateral groins. A for Mr. overlies expected location of the urinary bladder.  Mild multilevel DDD is suspected within the lumbar spine, incompletely evaluated. No acute osseus abnormalities.  IMPRESSION:  Nonobstructive bowel gas pattern. Unremarkable colonic stool burden.   Electronically Signed   By: Sandi Mariscal M.D.   On: 08/18/2013 19:08    ASSESSMENT / PLAN:  PULMONARY A:  Acute on chronic respiratory failure AECOPD Hx of lung cancer s/p resection 2009 Increased mediatinal LAN, nonspecific EDEMA increased 1/30  P:   For neg balance on cvvhd pcxr in am  Repeat abg n ow to see if ph corrected, had uncompensated met acidosis, likely to require higher rate abg now  CARDIOVASCULAR A:  Nonspecific chest pain PRWP on EKG New onset AFRVR Peripheral vascular disease P:  Diltiazem off in shock amio off, restart if rvr noted Neo to map goals, max at 200, vaso steroids tsh cvp 16, even balance  RENAL Lab Results  Component Value Date   CREATININE 2.61* 08/19/2013   CREATININE 2.53* 08/18/2013   CREATININE 1.57* 08/18/2013    Recent Labs Lab 08/18/13 0527 08/18/13 2140 08/19/13 0320  K 4.8 6.3* 4.4     A: New renal failure, no hydro on CT, ATN likely, AG acidosis ( uremia, lactic acid) P:   cvvhd Renal following No role bicarb, consider dc after liter through, then kvo fluids, cvp 16 Bladder pressure 20, however, does not match abdo comp syndrome clinically  GASTROINTESTINAL A:  Chronic PPI use, shock liver likely, unlikley abdo ischemia P:   SUP: Cont home dose of ppi Acute hep panel Repeat lft in am hida if bili worsens, changes on ct are from anasarca  HEMATOLOGIC A:  Thrombocytopenia (sepsis), unknown chronicity P: hep iv for fib Plat repeat in am   INFECTIOUS A:   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    And edited.  [name].  Titus Mould, MD, [place] Pgr: [facility]]

## 2013-08-19 NOTE — Progress Notes (Signed)
At [place], pt opened eyes, followed commands BP improving, Neo decreased.  Pt denies pain.  Color improving.

## 2013-08-19 NOTE — Progress Notes (Signed)
Report called to RN,Jaun, at [facility].

## 2013-08-19 NOTE — Progress Notes (Signed)
Pharmacy Consult - Heparin  PM heparin level therapeutic at 0.56 Afib  Plan:  1) Continue heparin at 900 units / hr 2) Follow up AM labs  Thank you. Anette Guarneri, PharmD [phone]

## 2013-08-19 NOTE — Progress Notes (Signed)
DAILY PROGRESS NOTE  Subjective:  Critically ill, sedated and intubated on ventilator. EKG today shows he is back in sinus with PAC's.  He is currently on CVVHD with plans to keep net even. Drips include vasopressin, neosynephrine and milrinone.  MAP around 60 currently. CVP is 14.  He is no longer on amiodarone.   Objective:  Temp:  [90.1 F (32.3 C)-98.1 F (36.7 C)] 97.6 F (36.4 C) (01/30 1200) Pulse Rate:  [47-103] 76 (01/30 1240) Resp:  [0-27] 24 (01/30 1240) BP: (46-114)/(23-86) 74/33 mmHg (01/30 1240) SpO2:  [86 %-100 %] 100 % (01/30 1240) Arterial Line BP: (102-124)/(38-46) 112/41 mmHg (01/30 1200) FiO2 (%):  [40 %] 40 % (01/30 1240) Weight:  [156 lb 15.5 oz (71.2 kg)] 156 lb 15.5 oz (71.2 kg) (01/30 0500) Weight change:   Intake/Output from previous day: 01/29 0701 - 01/30 0700 In: 4474.2 [I.V.:3284.2; NG/GT:330; IV Piggyback:860] Out: 161 [Urine:140; Emesis/NG output:400]  Intake/Output from this shift: Total I/O In: 844.1 [I.V.:806.6; IV Piggyback:37.5] Out: 1190 [Urine:170; Emesis/NG output:30; Other:990]  Medications: Current Facility-Administered Medications  Medication Dose Route Frequency Provider Last Rate Last Dose  . 0.45 % sodium chloride infusion   Intravenous Continuous Hester Mates, MD 20 mL/hr at 08/19/13 1243 20 mL/hr at 08/19/13 1243  . 0.9 %  sodium chloride infusion  250 mL Intravenous PRN Wilhelmina Mcardle, MD      . antiseptic oral rinse (BIOTENE) solution 15 mL  15 mL Mouth Rinse q12n4p Wilhelmina Mcardle, MD   15 mL at 08/19/13 1200  . aspirin chewable tablet 81 mg  81 mg Oral Daily Wilhelmina Mcardle, MD   81 mg at 08/18/13 0930  . budesonide (PULMICORT) nebulizer solution 0.25 mg  0.25 mg Nebulization Q6H Wilhelmina Mcardle, MD   0.25 mg at 08/19/13 0960  . chlorhexidine (PERIDEX) 0.12 % solution 15 mL  15 mL Mouth Rinse BID Wilhelmina Mcardle, MD   15 mL at 08/19/13 0850  . clopidogrel (PLAVIX) tablet 75 mg  75 mg Oral Q breakfast Wilhelmina Mcardle, MD    75 mg at 08/18/13 0929  . etomidate (AMIDATE) 2 MG/ML injection           . fentaNYL (SUBLIMAZE) 10 mcg/mL in sodium chloride 0.9 % 250 mL infusion  0-400 mcg/hr Intravenous Continuous Chesley Mires, MD 10 mL/hr at 08/19/13 0200 100 mcg/hr at 08/19/13 0200  . fentaNYL (SUBLIMAZE) bolus via infusion 25-50 mcg  25-50 mcg Intravenous Q1H PRN Chesley Mires, MD      . heparin ADULT infusion 100 units/mL (25000 units/250 mL)  900 Units/hr Intravenous Continuous Dorrene German, RPH 9 mL/hr at 08/18/13 2344 900 Units/hr at 08/18/13 2344  . heparin injection 1,000-6,000 Units  1,000-6,000 Units CRRT PRN Sherril Croon, MD      . hydrocortisone sodium succinate (SOLU-CORTEF) 100 MG injection 50 mg  50 mg Intravenous Q6H Wilhelmina Mcardle, MD   50 mg at 08/19/13 0700  . insulin aspart (novoLOG) injection 0-9 Units  0-9 Units Subcutaneous Q4H Wilhelmina Mcardle, MD   2 Units at 08/19/13 1238  . ipratropium (ATROVENT) nebulizer solution 0.5 mg  0.5 mg Nebulization Q6H Wilhelmina Mcardle, MD   0.5 mg at 08/19/13 4540  . levalbuterol (XOPENEX) nebulizer solution 0.63 mg  0.63 mg Nebulization Q6H Wilhelmina Mcardle, MD   0.63 mg at 08/19/13 9811  . levalbuterol (XOPENEX) nebulizer solution 0.63 mg  0.63 mg Nebulization Q3H PRN Wilhelmina Mcardle, MD      .  LORazepam (ATIVAN) injection 0.5-1 mg  0.5-1 mg Intravenous Q4H PRN Juanito Doom, MD   1 mg at 08/16/13 2334  . milrinone [facility]) infusion 200 mcg/mL (0.2 mg/ml)  0.25 mcg/kg/min Intravenous Continuous Dorrene German, RPH 4.8 mL/hr at 08/18/13 2337 0.25 mcg/kg/min at 08/18/13 2337  . pantoprazole (PROTONIX) injection 40 mg  40 mg Intravenous Q24H Chesley Mires, MD   40 mg at 08/19/13 0113  . phenylephrine (NEO-SYNEPHRINE) 40 mg in dextrose 5 % 250 mL infusion  30-200 mcg/min Intravenous Continuous Wilhelmina Mcardle, MD 37.5 mL/hr at 08/19/13 1240 100 mcg/min at 08/19/13 1240  . piperacillin-tazobactam (ZOSYN) IVPB 3.375 g  3.375 g Intravenous Q6H Lauren Bajbus, RPH    3.375 g at 08/19/13 1239  . prismasol BGK 4/2.5 5,000 mL dialysis replacement fluid   CRRT Continuous Sherril Croon, MD 200 mL/hr at 08/19/13 0603    . prismasol BGK 4/2.5 5,000 mL dialysis replacement fluid   CRRT Continuous Sherril Croon, MD 300 mL/hr at 08/18/13 2330    . prismasol BGK 4/2.5 5,000 mL dialysis solution   CRRT Continuous Sherril Croon, MD 2,000 mL/hr at 08/19/13 1110    . sodium chloride 0.9 % primer fluid for CRRT   CRRT PRN Sherril Croon, MD      . vancomycin (VANCOCIN) IVPB 1000 mg/200 mL premix  1,000 mg Intravenous Q24H Wilhelmina Mcardle, MD   1,000 mg at 08/18/13 2122  . vasopressin (PITRESSIN) 50 Units in sodium chloride 0.9 % 250 mL infusion  0.03 Units/min Intravenous Continuous Chesley Mires, MD 9 mL/hr at 08/18/13 1923 0.03 Units/min at 08/18/13 1923    Physical Exam: General appearance: intubated, sedated on vent. Pupils dilated Neck: JVD - 3 cm above sternal notch and no carotid bruit Lungs: diminished breath sounds bilaterally Heart: regular rate and rhythm and ocassional ectopy, no murmur Extremities: no edema, warm, well-perfusion  Lab Results: Results for orders placed during the hospital encounter of 08/16/13 (from the past 48 hour(s))  INFLUENZA PANEL BY PCR (TYPE A & B, H1N1)     Status: None   Collection Time    08/17/13  2:52 PM      Result Value Range   Influenza A By PCR NEGATIVE  NEGATIVE   Influenza B By PCR NEGATIVE  NEGATIVE   H1N1 flu by pcr NOT DETECTED  NOT DETECTED   Comment:            The Xpert Flu assay (FDA approved for     nasal aspirates or washes and     nasopharyngeal swab specimens), is     intended as an aid in the diagnosis of     influenza and should not be used as     a sole basis for treatment.     Performed at [facility]  RESPIRATORY VIRUS PANEL     Status: None   Collection Time    08/17/13  2:52 PM      Result Value Range   Source - RVPAN NASOPHARYNGEAL SWAB     Comment: CORRECTED ON 01/29 AT 1946:  PREVIOUSLY REPORTED AS NASOPHARYNGEAL SWAB   Respiratory Syncytial Virus A NOT DETECTED     Respiratory Syncytial Virus B NOT DETECTED     Influenza A NOT DETECTED     Influenza B NOT DETECTED     Parainfluenza 1 NOT DETECTED     Parainfluenza 2 NOT DETECTED     Parainfluenza 3 NOT DETECTED  Metapneumovirus NOT DETECTED     Rhinovirus NOT DETECTED     Adenovirus NOT DETECTED     Influenza A H1 NOT DETECTED     Influenza A H3 NOT DETECTED     Comment: (NOTE)           Normal Reference Range for each Analyte: NOT DETECTED     Testing performed using the Luminex xTAG Respiratory Viral Panel test     kit.     This test was developed and its performance characteristics determined     by [facility]. It has not been cleared or approved by the [place]     Food and Drug Administration. This test is used for clinical purposes.     It should not be regarded as investigational or for research. This     laboratory is certified under the [place] (CLIA) as qualified to perform high complexity     clinical laboratory testing.     Performed at [place], ARTERIAL     Status: Abnormal   Collection Time    08/17/13  3:35 PM      Result Value Range   FIO2 1.00     Delivery systems VENTILATOR     Mode PRESSURE REGULATED VOLUME CONTROL     VT 500     Rate 14     Peep/cpap 5.0     pH, Arterial 7.353  7.350 - 7.450   pCO2 arterial 34.2 (*) 35.0 - 45.0 mmHg   pO2, Arterial 322.0 (*) 80.0 - 100.0 mmHg   Bicarbonate 18.5 (*) 20.0 - 24.0 mEq/L   TCO2 16.9  0 - 100 mmol/L   Acid-base deficit 5.8 (*) 0.0 - 2.0 mmol/L   O2 Saturation 99.5     Patient temperature 98.6     Collection site RIGHT RADIAL     Drawn by [phone]     Sample type ARTERIAL DRAW     Allens test (pass/fail) PASS  PASS  TROPONIN I     Status: None   Collection Time    08/17/13 10:40 PM      Result Value Range   Troponin I <0.30  <0.30 ng/mL   Comment:             Due to the release kinetics of cTnI,     a negative result within the first hours     of the onset of symptoms does not rule out     myocardial infarction with certainty.     If myocardial infarction is still suspected,     repeat the test at appropriate intervals.  APTT     Status: Abnormal   Collection Time    08/18/13 12:25 AM      Result Value Range   aPTT 51 (*) 24 - 37 seconds   Comment:            IF BASELINE aPTT IS ELEVATED,     SUGGEST PATIENT RISK ASSESSMENT     BE USED TO DETERMINE APPROPRIATE     ANTICOAGULANT THERAPY.  PROTIME-INR     Status: Abnormal   Collection Time    08/18/13 12:25 AM      Result Value Range   Prothrombin Time 26.0 (*) 11.6 - 15.2 seconds   INR 2.48 (*) 0.00 - 1.49  BLOOD GAS, ARTERIAL     Status: Abnormal   Collection Time    08/18/13  5:15 AM      Result Value Range   FIO2 0.40     Delivery systems VENTILATOR     Mode PRESSURE REGULATED VOLUME CONTROL     VT 500     Rate 14     Peep/cpap 5.0     pH, Arterial 7.206 (*) 7.350 - 7.450   pCO2 arterial 42.3  35.0 - 45.0 mmHg   pO2, Arterial 101.0 (*) 80.0 - 100.0 mmHg   Bicarbonate 16.1 (*) 20.0 - 24.0 mEq/L   TCO2 14.9  0 - 100 mmol/L   Acid-base deficit 11.2 (*) 0.0 - 2.0 mmol/L   O2 Saturation 96.0     Patient temperature 98.6     Collection site RIGHT RADIAL     Drawn by [phone]     Sample type ARTERIAL     Allens test (pass/fail) PASS  PASS  TROPONIN I     Status: None   Collection Time    08/18/13  5:27 AM      Result Value Range   Troponin I <0.30  <0.30 ng/mL   Comment:            Due to the release kinetics of cTnI,     a negative result within the first hours     of the onset of symptoms does not rule out     myocardial infarction with certainty.     If myocardial infarction is still suspected,     repeat the test at appropriate intervals.  BASIC METABOLIC PANEL     Status: Abnormal   Collection Time    08/18/13  5:27 AM      Result Value Range   Sodium  136 (*) 137 - 147 mEq/L   Potassium 4.8  3.7 - 5.3 mEq/L   Chloride 102  96 - 112 mEq/L   CO2 15 (*) 19 - 32 mEq/L   Glucose, Bld 240 (*) 70 - 99 mg/dL   BUN 29 (*) 6 - 23 mg/dL   Comment: DELTA CHECK NOTED     REPEATED TO VERIFY   Creatinine, Ser 1.57 (*) 0.50 - 1.35 mg/dL   Comment: DELTA CHECK NOTED     REPEATED TO VERIFY   Calcium 8.0 (*) 8.4 - 10.5 mg/dL   GFR calc non Af Amer 42 (*) >90 mL/min   GFR calc Af Amer 49 (*) >90 mL/min   Comment: (NOTE)     The eGFR has been calculated using the CKD EPI equation.     This calculation has not been validated in all clinical situations.     eGFR's persistently <90 mL/min signify possible Chronic Kidney     Disease.  PHOSPHORUS     Status: Abnormal   Collection Time    08/18/13  5:27 AM      Result Value Range   Phosphorus 5.7 (*) 2.3 - 4.6 mg/dL  MAGNESIUM     Status: None   Collection Time    08/18/13  5:27 AM      Result Value Range   Magnesium 2.0  1.5 - 2.5 mg/dL  CBC WITH DIFFERENTIAL     Status: Abnormal   Collection Time    08/18/13  5:27 AM      Result Value Range   WBC 21.1 (*) 4.0 - 10.5 K/uL   Comment: REPEATED TO VERIFY   RBC 4.41  4.22 - 5.81 MIL/uL   Hemoglobin 13.5  13.0 - 17.0 g/dL   HCT 42.4  39.0 -  52.0 %   MCV 96.1  78.0 - 100.0 fL   MCH 30.6  26.0 - 34.0 pg   MCHC 31.8  30.0 - 36.0 g/dL   RDW 15.3  11.5 - 15.5 %   Platelets 136 (*) 150 - 400 K/uL   Comment: REPEATED TO VERIFY     DELTA CHECK NOTED   Neutrophils Relative % 46  43 - 77 %   Lymphocytes Relative 51 (*) 12 - 46 %   Monocytes Relative 3  3 - 12 %   Eosinophils Relative 0  0 - 5 %   Basophils Relative 0  0 - 1 %   Neutro Abs 9.7 (*) 1.7 - 7.7 K/uL   Lymphs Abs 10.8 (*) 0.7 - 4.0 K/uL   Monocytes Absolute 0.6  0.1 - 1.0 K/uL   Eosinophils Absolute 0.0  0.0 - 0.7 K/uL   Basophils Absolute 0.0  0.0 - 0.1 K/uL   WBC Morphology ABSOLUTE LYMPHOCYTOSIS    PATHOLOGIST SMEAR REVIEW     Status: None   Collection Time    08/18/13  5:27 AM       Result Value Range   Path Review Reviewed by Kalman Drape, M.D.     Comment: 01.29.15                Absolute lymphocytosis. Suggest     immunophenotyping if a new,     persistent finding.             HEMOGLOBIN A1C     Status: Abnormal   Collection Time    08/18/13  5:27 AM      Result Value Range   Hemoglobin A1C 5.8 (*) <5.7 %   Comment: (NOTE)                                                                               According to the ADA Clinical Practice Recommendations for 2011, when     HbA1c is used as a screening test:      >=6.5%   Diagnostic of Diabetes Mellitus               (if abnormal result is confirmed)     5.7-6.4%   Increased risk of developing Diabetes Mellitus     References:Diagnosis and Classification of Diabetes Mellitus,Diabetes     ZHYQ,6578,46(NGEXB 1):S62-S69 and Standards of Medical Care in             Diabetes - 2011,Diabetes Care,2011,34 (Suppl 1):S11-S61.   Mean Plasma Glucose 120 (*) <117 mg/dL   Comment: Performed at [place], RESPIRATORY (NON-EXPECTORATED)     Status: None   Collection Time    08/18/13  9:01 AM      Result Value Range   Specimen Description TRACHEAL ASPIRATE     Special Requests Normal     Gram Stain       Value: RARE WBC PRESENT,BOTH PMN AND MONONUCLEAR     NO SQUAMOUS EPITHELIAL CELLS SEEN     NO ORGANISMS SEEN     Performed at [facility]   Culture       Value: Culture reincubated for better growth  Performed at [facility]   Report Status PENDING    GLUCOSE, CAPILLARY     Status: Abnormal   Collection Time    08/18/13  3:55 PM      Result Value Range   Glucose-Capillary 161 (*) 70 - 99 mg/dL   Comment 1 Documented in Chart     Comment 2 Notify RN    BLOOD GAS, ARTERIAL     Status: Abnormal   Collection Time    08/18/13  9:05 PM      Result Value Range   FIO2 0.40     Delivery systems VENTILATOR     Mode PRESSURE REGULATED VOLUME CONTROL     VT 500     Rate  14.0     Peep/cpap 5.0     pH, Arterial 7.119 (*) 7.350 - 7.450   Comment: CRITICAL RESULT CALLED TO, READ BACK BY AND VERIFIED WITH:      MICHELLE REEVES, RN AT 2115 BY JESSICA NEUGENT,RRT,RCP ON 08/18/2013   pCO2 arterial 42.0  35.0 - 45.0 mmHg   pO2, Arterial 84.8  80.0 - 100.0 mmHg   Bicarbonate 13.2 (*) 20.0 - 24.0 mEq/L   TCO2 12.7  0 - 100 mmol/L   Acid-base deficit 16.0 (*) 0.0 - 2.0 mmol/L   O2 Saturation 94.0     Patient temperature 36.3     Collection site LEFT RADIAL     Drawn by [id number]     Sample type ARTERIAL DRAW     Allens test (pass/fail) PASS  PASS  LACTIC ACID, PLASMA     Status: Abnormal   Collection Time    08/18/13  9:40 PM      Result Value Range   Lactic Acid, Venous 3.1 (*) 0.5 - 2.2 mmol/L  COMPREHENSIVE METABOLIC PANEL     Status: Abnormal   Collection Time    08/18/13  9:40 PM      Result Value Range   Sodium 132 (*) 137 - 147 mEq/L   Potassium 6.3 (*) 3.7 - 5.3 mEq/L   Comment: NO VISIBLE HEMOLYSIS     DELTA CHECK NOTED   Chloride 100  96 - 112 mEq/L   CO2 17 (*) 19 - 32 mEq/L   Glucose, Bld 298 (*) 70 - 99 mg/dL   BUN 39 (*) 6 - 23 mg/dL   Creatinine, Ser 2.53 (*) 0.50 - 1.35 mg/dL   Comment: DELTA CHECK NOTED   Calcium 7.0 (*) 8.4 - 10.5 mg/dL   Total Protein 5.0 (*) 6.0 - 8.3 g/dL   Albumin 2.7 (*) 3.5 - 5.2 g/dL   AST 160 (*) 0 - 37 U/L   ALT 172 (*) 0 - 53 U/L   Alkaline Phosphatase 65  39 - 117 U/L   Total Bilirubin 0.6  0.3 - 1.2 mg/dL   GFR calc non Af Amer 24 (*) >90 mL/min   GFR calc Af Amer 27 (*) >90 mL/min   Comment: (NOTE)     The eGFR has been calculated using the CKD EPI equation.     This calculation has not been validated in all clinical situations.     eGFR's persistently <90 mL/min signify possible Chronic Kidney     Disease.  CBC     Status: Abnormal   Collection Time    08/18/13  9:40 PM      Result Value Range   WBC 28.6 (*) 4.0 - 10.5 K/uL   RBC 4.47  4.22 - 5.81 MIL/uL  Hemoglobin 13.5  13.0 - 17.0 g/dL    HCT 43.7  39.0 - 52.0 %   MCV 97.8  78.0 - 100.0 fL   MCH 30.2  26.0 - 34.0 pg   MCHC 30.9  30.0 - 36.0 g/dL   RDW 15.5  11.5 - 15.5 %   Platelets 120 (*) 150 - 400 K/uL  TROPONIN I     Status: None   Collection Time    08/18/13  9:40 PM      Result Value Range   Troponin I <0.30  <0.30 ng/mL   Comment:            Due to the release kinetics of cTnI,     a negative result within the first hours     of the onset of symptoms does not rule out     myocardial infarction with certainty.     If myocardial infarction is still suspected,     repeat the test at appropriate intervals.  GLUCOSE, CAPILLARY     Status: Abnormal   Collection Time    08/19/13  1:26 AM      Result Value Range   Glucose-Capillary 174 (*) 70 - 99 mg/dL  PHOSPHORUS     Status: Abnormal   Collection Time    08/19/13  3:20 AM      Result Value Range   Phosphorus 6.9 (*) 2.3 - 4.6 mg/dL  MAGNESIUM     Status: None   Collection Time    08/19/13  3:20 AM      Result Value Range   Magnesium 1.7  1.5 - 2.5 mg/dL  CBC WITH DIFFERENTIAL     Status: Abnormal   Collection Time    08/19/13  3:20 AM      Result Value Range   WBC 29.1 (*) 4.0 - 10.5 K/uL   RBC 4.07 (*) 4.22 - 5.81 MIL/uL   Hemoglobin 12.5 (*) 13.0 - 17.0 g/dL   HCT 38.9 (*) 39.0 - 52.0 %   MCV 95.6  78.0 - 100.0 fL   MCH 30.7  26.0 - 34.0 pg   MCHC 32.1  30.0 - 36.0 g/dL   RDW 15.1  11.5 - 15.5 %   Platelets 113 (*) 150 - 400 K/uL   Neutrophils Relative % 58  43 - 77 %   Lymphocytes Relative 33  12 - 46 %   Monocytes Relative 9  3 - 12 %   Eosinophils Relative 0  0 - 5 %   Basophils Relative 0  0 - 1 %   Neutro Abs 16.9 (*) 1.7 - 7.7 K/uL   Lymphs Abs 9.6 (*) 0.7 - 4.0 K/uL   Monocytes Absolute 2.6 (*) 0.1 - 1.0 K/uL   Eosinophils Absolute 0.0  0.0 - 0.7 K/uL   Basophils Absolute 0.0  0.0 - 0.1 K/uL   RBC Morphology POLYCHROMASIA PRESENT     WBC Morphology HYPERSEGMENTED NEUT     Comment: WHITE COUNT CONFIRMED ON SMEAR   Smear Review  PLATELET COUNT CONFIRMED BY SMEAR    PRO B NATRIURETIC PEPTIDE     Status: Abnormal   Collection Time    08/19/13  3:20 AM      Result Value Range   Pro B Natriuretic peptide (BNP) 22750.0 (*) 0 - 125 pg/mL  COMPREHENSIVE METABOLIC PANEL     Status: Abnormal   Collection Time    08/19/13  3:20 AM      Result Value Range  Sodium 135 (*) 137 - 147 mEq/L   Potassium 4.4  3.7 - 5.3 mEq/L   Comment: DELTA CHECK NOTED   Chloride 100  96 - 112 mEq/L   CO2 18 (*) 19 - 32 mEq/L   Glucose, Bld 209 (*) 70 - 99 mg/dL   BUN 39 (*) 6 - 23 mg/dL   Creatinine, Ser 2.61 (*) 0.50 - 1.35 mg/dL   Calcium 7.0 (*) 8.4 - 10.5 mg/dL   Total Protein 4.6 (*) 6.0 - 8.3 g/dL   Albumin 2.5 (*) 3.5 - 5.2 g/dL   AST 936 (*) 0 - 37 U/L   ALT 829 (*) 0 - 53 U/L   Alkaline Phosphatase 60  39 - 117 U/L   Total Bilirubin 0.7  0.3 - 1.2 mg/dL   GFR calc non Af Amer 23 (*) >90 mL/min   GFR calc Af Amer 26 (*) >90 mL/min   Comment: (NOTE)     The eGFR has been calculated using the CKD EPI equation.     This calculation has not been validated in all clinical situations.     eGFR's persistently <90 mL/min signify possible Chronic Kidney     Disease.  LIPASE, BLOOD     Status: None   Collection Time    08/19/13  3:20 AM      Result Value Range   Lipase 46  11 - 59 U/L  GLUCOSE, CAPILLARY     Status: Abnormal   Collection Time    08/19/13  5:11 AM      Result Value Range   Glucose-Capillary 183 (*) 70 - 99 mg/dL   Comment 1 Documented in Chart     Comment 2 Notify RN    URINALYSIS, ROUTINE W REFLEX MICROSCOPIC     Status: Abnormal   Collection Time    08/19/13  6:38 AM      Result Value Range   Color, Urine YELLOW  YELLOW   APPearance CLOUDY (*) CLEAR   Specific Gravity, Urine 1.016  1.005 - 1.030   pH 5.0  5.0 - 8.0   Glucose, UA NEGATIVE  NEGATIVE mg/dL   Hgb urine dipstick LARGE (*) NEGATIVE   Bilirubin Urine NEGATIVE  NEGATIVE   Ketones, ur NEGATIVE  NEGATIVE mg/dL   Protein, ur 30 (*) NEGATIVE  mg/dL   Urobilinogen, UA 0.2  0.0 - 1.0 mg/dL   Nitrite NEGATIVE  NEGATIVE   Leukocytes, UA NEGATIVE  NEGATIVE  URINE MICROSCOPIC-ADD ON     Status: Abnormal   Collection Time    08/19/13  6:38 AM      Result Value Range   Squamous Epithelial / LPF RARE  RARE   WBC, UA 3-6  <3 WBC/hpf   RBC / HPF TOO NUMEROUS TO COUNT  <3 RBC/hpf   Bacteria, UA FEW (*) RARE   Casts HYALINE CASTS (*) NEGATIVE   Comment: GRANULAR CAST  HEPARIN LEVEL (UNFRACTIONATED)     Status: None   Collection Time    08/19/13  8:00 AM      Result Value Range   Heparin Unfractionated 0.51  0.30 - 0.70 IU/mL   Comment:            IF HEPARIN RESULTS ARE BELOW     EXPECTED VALUES, AND PATIENT     DOSAGE HAS BEEN CONFIRMED,     SUGGEST FOLLOW UP TESTING     OF ANTITHROMBIN III LEVELS.  GLUCOSE, CAPILLARY     Status: Abnormal   Collection  Time    08/19/13  8:08 AM      Result Value Range   Glucose-Capillary 165 (*) 70 - 99 mg/dL  POCT I-STAT 3, BLOOD GAS (G3+)     Status: Abnormal   Collection Time    08/19/13 11:20 AM      Result Value Range   pH, Arterial 7.279 (*) 7.350 - 7.450   pCO2 arterial 51.2 (*) 35.0 - 45.0 mmHg   pO2, Arterial 78.0 (*) 80.0 - 100.0 mmHg   Bicarbonate 24.1 (*) 20.0 - 24.0 mEq/L   TCO2 26  0 - 100 mmol/L   O2 Saturation 94.0     Acid-base deficit 3.0 (*) 0.0 - 2.0 mmol/L   Patient temperature 97.8 F     Collection site ARTERIAL LINE     Drawn by Nurse     Sample type ARTERIAL    GLUCOSE, CAPILLARY     Status: Abnormal   Collection Time    08/19/13 11:23 AM      Result Value Range   Glucose-Capillary 165 (*) 70 - 99 mg/dL  LACTIC ACID, PLASMA     Status: Abnormal   Collection Time    08/19/13 11:30 AM      Result Value Range   Lactic Acid, Venous 2.4 (*) 0.5 - 2.2 mmol/L    Imaging: Ct Abdomen Pelvis Wo Contrast  08/18/2013   CLINICAL DATA:  Assess for intra-abdominal infection  EXAM: CT ABDOMEN AND PELVIS WITHOUT CONTRAST  TECHNIQUE: Multidetector CT imaging of the  abdomen and pelvis was performed following the standard protocol without intravenous contrast.  COMPARISON:  Prior radiograph from 08/18/2013  FINDINGS: A moderate left pleural effusion with associated compressive atelectasis is present. Small right pleural effusion with associated atelectasis is also noted. No definite focal infiltrate seen within the partially visualized lung bases. Emphysematous changes noted.  Cardiomegaly partially visualized. No pericardial effusion. A few scattered atherosclerotic plaques noted within the partially visualized left anterior descending coronary artery.  Limited noncontrast evaluation liver is grossly unremarkable. Hyperdensity is seen within the nondilated gallbladder, which may represent vicarious excretion of contrast. More hyperdense layering stones are seen within the gallbladder lumen. There is prominent gallbladder wall thickening with small amount of pericholecystic fluid. No definite biliary ductal dilatation identified.  The spleen is within normal limits. Adrenal glands are unremarkable. Limited noncontrast evaluation of the pancreas is grossly unremarkable.  Enteric tube is coiled within the stomach. There is a small amount of enteric contrast material within the gastric lumen and proximal small bowel. There is question of circumferential bladder wall thickening within the second, third, and fourth portions of the duodenum (series 2, image 42). This finding is somewhat poorly evaluated due to small amount of enteric contrast as well as lack of IV contrast, and may be in part related to incomplete distension. No evidence of bowel obstruction. Scattered colonic diverticula are present without definite evidence of acute diverticulitis. No pneumatosis or portal venous gas identified. No definite inflammatory changes seen about the bowels, although evaluation is limited due to lack of IV contrast. Appendix is normal.  The bladder is decompressed with a Foley catheter in  place small amount of gas lucency within the nondependent portion of the bladder lumen is most compatible with catheterization. Prostate is normal.  Moderate volume ascites is present within the abdomen. No definite loculated fluid collection identified. No free air identified.  Vascular stents noted at the origin of the celiac access in within the proximal SMA. Additional iliac stents  noted as well. Surgical clips seen about the common femoral arteries bilaterally. 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 Soft tissue density within the adjacent. Soft tissues is likely postsurgical in nature.  No enlarged intra-abdominal pelvic lymph nodes are identified.  Diffuse anasarca is noted. Small focus of subcutaneous emphysema noted within the subcutaneous fat of the anterior mid abdomen (series 2, image 46), which may be iatrogenic.  Multilevel degenerative disc disease noted within the visualized spine with prominent facet arthrosis at L3 through S1.  IMPRESSION: 1. Question circumferential bowel wall thickening versus incomplete distention within the duodenum as above, nonspecific, but may represent sequelae of infection or inflammation. Possible ischemic bowel could also be considered in the correct clinical setting. No portal venous gas, free air, or pneumatosis identified. 2. Moderate volume ascites. No definite loculated fluid collection identified. 3. Left greater than right bilateral pleural effusions with associated bibasilar atelectasis. 4. Cholelithiasis with circumferential gallbladder wall thickening and/or pericholecystic fluid. Finding may be related to overall volume status. Clinical correlation and/or right upper quadrant ultrasound could be performed if there is clinical concern for acute cholecystitis. No biliary ductal dilatation identified. 5. Colonic diverticulosis without acute diverticulitis. 6. Aorto bi-iliac atherosclerotic disease with  vascular stents within the celiac axis, SMA, and bilateral iliac vessels. 7. 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 8. Moderate cardiomegaly. 9. Diffuse anasarca.   Electronically Signed   By: Jeannine Boga M.D.   On: 08/18/2013 23:49   Dg Chest Port 1 View  08/19/2013   CLINICAL DATA:  Line placement  EXAM: PORTABLE CHEST - 1 VIEW  COMPARISON:  08/18/2013  FINDINGS: New right IJ temporary dialysis time catheter noted, tip mid SVC. Endotracheal to 3 cm above the carina. NG tube extends into the stomach. Cardiomegaly evident with vascular congestion and dense left lower lobe collapse/consolidation. Postop changes in the right lung with associated volume loss. No enlarging effusion or pneumothorax.  IMPRESSION: New right IJ temporary dialysis catheter, tip mid SVC level  No pneumothorax.  No other significant change   Electronically Signed   By: Daryll Brod M.D.   On: 08/19/2013 01:20   Dg Chest Port 1 View  08/18/2013   CLINICAL DATA:  Acute on chronic respiratory failure. Endotracheal tube position.  EXAM: PORTABLE CHEST - 1 VIEW  COMPARISON:  08/17/2013  FINDINGS: Endotracheal tube tip is 16 mm above the carina and directed toward the right. Central venous catheter and NG tube appear in good position.  There is a new area of infiltrate or atelectasis at the left lung base. There is slight pulmonary vascular congestion which is new. Chronic changes throughout the right lung are stable.  IMPRESSION: 1. Endotracheal tube is 16 mm above the carina and directed toward the right. 2. New atelectasis/infiltrate at the left lung base. 3. New slight pulmonary vascular congestion.   Electronically Signed   By: Rozetta Nunnery M.D.   On: 08/18/2013 07:36   Dg Chest Port 1 View  08/17/2013   CLINICAL DATA:  Central line and endotracheal tube placement. Acute on chronic respiratory failure. COPD.  EXAM: PORTABLE CHEST - 1 VIEW 3:09 p.m.  COMPARISON:  08/17/2013 at 5:13  a.m.  FINDINGS: Endotracheal tube tip is 2.4 cm above the carina. Left jugular vein catheter tip is in the superior vena cava.  There is chronic elevation of the right hemidiaphragm with chronic blunting of the right costophrenic angle.  There is pulmonary vascular congestion. No effusions.  IMPRESSION: Endotracheal tube and central line in good position. New pulmonary vascular congestion.   Electronically Signed   By: Rozetta Nunnery M.D.   On: 08/17/2013 15:28   Dg Abd Portable 1v  08/18/2013   CLINICAL DATA:  Abdominal distension  EXAM: PORTABLE ABDOMEN - 1 VIEW  COMPARISON:  None.  FINDINGS: Nonobstructive bowel gas pattern. Unremarkable colonic stool burden. Nondiagnostic evaluation for pneumoperitoneum secondary to supine positioning and exclusion of the lower thorax. No definite pneumatosis or portal venous gas. Enteric tube tip and side port overlie the expected location of the gastric fundus.  Vascular stents overlie the expected location of the left common iliac artery, SMA and possibly the celiac artery. Multiple surgical clips overlie the bilateral groins. A for Mr. overlies expected location of the urinary bladder.  Mild multilevel DDD is suspected within the lumbar spine, incompletely evaluated. No acute osseus abnormalities.  IMPRESSION: Nonobstructive bowel gas pattern. Unremarkable colonic stool burden.   Electronically Signed   By: Sandi Mariscal M.D.   On: 08/18/2013 19:08    Assessment:  1. Principal Problem: 2.   Acute on chronic respiratory failure 3. Active Problems: 4.   Acute exacerbation of chronic obstructive pulmonary disease (COPD) 5.   History of lung cancer, s/p RUL resection 2009 @ [facility] 6.   Mediastinal adenopathy 7.   Smoker 8.   Atrial fibrillation with RVR 9.   PRWP on ECG 10.   New onset atrial fibrillation 11.   Chest tightness 12.   Peripheral vascular disease 13.   Vascular graft infection, on chronic doxycycline 14.   Thrombocytopenia 15.   Acute respiratory  failure 16.   Acute combined systolic and diastolic heart failure 17.   Septic shock(785.52) 18.   Plan:  1. [name] is critically ill with a mixture of septic and cardiogenic shock. At this point, his low blood pressure and worsening renal failure are contraindications to milrinone. I would recommend discontinuing that and continuing with neosynephrine and vasopressin.  As blood pressure allows, would attempt to try to remove some ultrafiltrate with CVVHD per nephrology. Agree with holding on amiodarone now, due to low BP, however, if a-fib recurs, consider repeat amiodarone bolus. Etiology of cardiomyopathy may be due to a-fib with RVR, multivessel CAD (although troponins have been negative - however there is coronary calcium in all 3 major coronary arteries), or simply sepsis.  Will continue to follow closely with you over the weekend.  Time Spent Directly with Patient:  30 minutes  Length of Stay:  LOS: 3 days   Pixie Casino, MD, [facility] Attending Cardiologist [facility]  Melane Windholz C 08/19/2013, 12:55 PM

## 2013-08-19 NOTE — Progress Notes (Signed)
ANTICOAGULATION and ANTIBIOTIC CONSULT NOTE - Follow Up Consult  Pharmacy Consult for heparin, Zosyn, and Vancomycin Indication: atrial fibrillation, sepsis  Allergies  Allergen Reactions  . Lactose Intolerance (Gi)   . Shellfish Allergy Diarrhea and Nausea And Vomiting  . Strawberry Rash    Patient Measurements: Height: 5\' 9"  (175.3 cm) Weight: 156 lb 15.5 oz (71.2 kg) IBW/kg (Calculated) : 70.7 Heparin Dosing Weight: 71.2kg  Vital Signs: Temp: 97.5 F (36.4 C) (01/30 1145) BP: 103/46 mmHg (01/30 1100) Pulse Rate: 87 (01/30 1145)  Labs:  Recent Labs  08/17/13 2240 08/18/13 0025 08/18/13 0527 08/18/13 2140 08/19/13 0320 08/19/13 0800  HGB  --   --  13.5 13.5 12.5*  --   HCT  --   --  42.4 43.7 38.9*  --   PLT  --   --  136* 120* 113*  --   APTT  --  51*  --   --   --   --   LABPROT  --  26.0*  --   --   --   --   INR  --  2.48*  --   --   --   --   HEPARINUNFRC  --   --   --   --   --  0.51  CREATININE  --   --  1.57* 2.53* 2.61*  --   TROPONINI <0.30  --  <0.30 <0.30  --   --     Estimated Creatinine Clearance: 25.2 ml/min (by C-G formula based on Cr of 2.61).  CBC    Component Value Date/Time   WBC 29.1* 08/19/2013 0320   RBC 4.07* 08/19/2013 0320   HGB 12.5* 08/19/2013 0320   HCT 38.9* 08/19/2013 0320   PLT 113* 08/19/2013 0320   MCV 95.6 08/19/2013 0320   MCH 30.7 08/19/2013 0320   MCHC 32.1 08/19/2013 0320   RDW 15.1 08/19/2013 0320   LYMPHSABS 9.6* 08/19/2013 0320   MONOABS 2.6* 08/19/2013 0320   EOSABS 0.0 08/19/2013 0320   BASOSABS 0.0 08/19/2013 0320   Antibiotics Given (last 72 hours)   Date/Time Action Medication Dose Rate   08/16/13 2249 Given   doxycycline (VIBRA-TABS) tablet 100 mg 100 mg    08/17/13 1043 Given   doxycycline (VIBRA-TABS) tablet 100 mg 100 mg    08/17/13 1224 Given   oseltamivir (TAMIFLU) capsule 75 mg 75 mg    08/17/13 2228 Given   doxycycline (VIBRA-TABS) tablet 100 mg 100 mg    08/17/13 2228 Given   oseltamivir (TAMIFLU) 6  MG/ML suspension 75 mg 75 mg    08/18/13 0930 Given   doxycycline (VIBRA-TABS) tablet 100 mg 100 mg    08/18/13 0930 Given   oseltamivir (TAMIFLU) 6 MG/ML suspension 75 mg 75 mg    08/18/13 2122 Given   vancomycin (VANCOCIN) IVPB 1000 mg/200 mL premix 1,000 mg 200 mL/hr   08/19/13 0012 Given   piperacillin-tazobactam (ZOSYN) IVPB 3.375 g 3.375 g 12.5 mL/hr   08/19/13 0851 Given  [med not on the unit]   piperacillin-tazobactam (ZOSYN) IVPB 3.375 g 3.375 g 12.5 mL/hr      Assessment: [name], Daniel transferred to [facility] d/t decreasing renal function and anuria requiring CRRT. Was transitioned to heparin from Lovenox (prophylaxis) for AFib around midnight at 900units/hr. First level this morning therapeutic at 0.51 units/mL. Does have thrombocytopenia- watching closely. Hgb 12.5, no overt bleeding noted. WBC 29.1 (patient is on steroids), currently afebrile- has been hypothermic.  Goal of Therapy:  Heparin level 0.3-0.7 units/ml Monitor platelets by anticoagulation protocol: Yes  Plan:  1. Continue heparin at 900 units/hr 2. Confirm heparin level at 1700 tonight 3. Change Zosyn to 3.375gm IV q6h for CRRT dosing 4. Continue vancomycin 1000mg  IV q24h (~10mg /kg q24h for CRRT dosing) 5. Follow CRRT LOT, clinical progression, c/s, levels when indicated  Atilano Covelli D. Wynonia Medero, PharmD, BCPS Clinical Pharmacist Pager: [phone] 08/19/2013 12:10 PM

## 2013-08-19 NOTE — Progress Notes (Signed)
NUTRITION FOLLOW UP  Intervention:    When able to resume TF, utilize 73M PEPuP Protocol: initiate TF via OGT with Vital HP at 25 ml/h on day 1; on day 2, increase to goal rate of 65 ml/h to provide 1560 kcals, 137 gm protein, 1304 ml free water daily.  Nutrition Dx:   Inadequate oral intake related to inability to eat as evidenced by NPO. Ongoing.  Goal:   Intake to meet >90% of estimated nutrition needs.  Monitor:   TF tolerance/adequacy, weight trend, labs, vent status.  Assessment:   Patient is a 73 y.o. Avila with COPD, hx of lung ca, PVD, current smoker presented to [facility] ED on 1/27 with 2 wks of progressive dyspnea and chest tightness minimally and transiently responsive to albuterol MDI. Developed afib rvr, abdominal process, resp failure, ETT placed and renal failure, sepsis. Transferred to [facility] on 1/29. Patient is lactose intolerant, allergic to strawberries and shellfish.   Discussed patient in ICU rounds today. CRRT was initiated today. Patient was receiving TF via OGT at [facility]. TF is currently on hold due to acute abdominal process, ? Ischemic bowel.  Patient is currently intubated on ventilator support.  MV: 10.6 L/min Temp (24hrs), Avg:96.9 F (36.1 C), Min:90.1 F (32.3 C), Max:98.1 F (36.7 C)   Height: Ht Readings from Last 1 Encounters:  08/16/13 5\' 9"  (1.753 Avila)    Weight Status:   Wt Readings from Last 1 Encounters:  08/19/13 156 lb 15.5 oz (71.2 kg)  08/17/13  140 lb 6.9 oz (63.7 kg)   Weight up with positive fluid status. Started CRRT today.  Re-estimated needs:  Kcal: 1575 Protein: 120-140 gm Fluid: >/= 1.6 L  Skin: no wounds  Diet Order: NPO   Intake/Output Summary (Last 24 hours) at 08/19/13 1236 Last data filed at 08/19/13 1200  Gross per 24 hour  Intake 4969.83 ml  Output   1849 ml  Net 3120.83 ml    Last BM: 1/27   Labs:   Recent Labs Lab 08/17/13 0305 08/18/13 0527 08/18/13 2140 08/19/13 0320  NA 136* 136* 132* 135*  K 4.2 4.8  6.3* 4.4  CL 103 102 100 100  CO2 17* 15* 17* 18*  BUN 17 29* 39* 39*  CREATININE 0.70 1.57* 2.53* 2.61*  CALCIUM 7.5* 8.0* 7.0* 7.0*  MG  --  2.0  --  1.7  PHOS  --  5.7*  --  6.9*  GLUCOSE 211* 240* 298* 209*    CBG (last 3)   Recent Labs  08/19/13 0511 08/19/13 0808 08/19/13 1123  GLUCAP 183* 165* 165*    Scheduled Meds: . antiseptic oral rinse  15 mL Mouth Rinse q12n4p  . aspirin  81 mg Oral Daily  . budesonide  0.25 mg Nebulization Q6H  . chlorhexidine  15 mL Mouth Rinse BID  . clopidogrel  75 mg Oral Q breakfast  . etomidate      . etomidate  0.3 mg/kg Intravenous Once  . hydrocortisone sod succinate (SOLU-CORTEF) inj  50 mg Intravenous Q6H  . insulin aspart  0-9 Units Subcutaneous Q4H  . ipratropium  0.5 mg Nebulization Q6H  . levalbuterol  0.63 mg Nebulization Q6H  . pantoprazole (PROTONIX) IV  40 mg Intravenous Q24H  . piperacillin-tazobactam  3.375 g Intravenous Q6H  . vancomycin  1,000 mg Intravenous Q24H    Continuous Infusions: . sodium chloride    . fentaNYL infusion INTRAVENOUS 100 mcg/hr (08/19/13 0200)  . heparin 900 Units/hr (08/18/13 2344)  .  milrinone 0.25 mcg/kg/min (08/18/13 2337)  . phenylephrine (NEO-SYNEPHRINE) Adult infusion 60 mcg/min (08/19/13 0943)  . dialysis replacement fluid (prismasate) 200 mL/hr at 08/19/13 0603  . dialysis replacement fluid (prismasate) 300 mL/hr at 08/18/13 2330  . dialysate (PRISMASATE) 2,000 mL/hr at 08/19/13 1110  . vasopressin (PITRESSIN) infusion - *FOR SHOCK* 0.03 Units/min (08/18/13 1923)    Molli Barrows, RD, LDN, [place] Pager [phone] After Hours Pager [phone]

## 2013-08-19 NOTE — Procedures (Signed)
Bronchoscopy Procedure Note [name] [id number] [birth year]  Procedure: Bronchoscopy Indications: Diagnostic evaluation of the airways  Procedure Details Consent: Risks of procedure as well as the alternatives and risks of each were explained to the (patient/caregiver).  Consent for procedure obtained. Time Out: Verified patient identification, verified procedure, site/side was marked, verified correct patient position, special equipment/implants available, medications/allergies/relevent history reviewed, required imaging and test results available.  Performed  In preparation for procedure, patient was given 100% FiO2 and bronchoscope lubricated. Sedation: Etomidate  Airway entered and the following bronchi were examined: RUL, RML, RLL, LUL, LLL and Bronchi.   Procedures performed: Brushings performed- none Bronchoscope removed.  , Patient placed back on 100% FiO2 at conclusion of procedure.    Evaluation Hemodynamic Status: BP stable throughout; O2 sats: stable throughout Patient's Current Condition: stable Specimens:  Sent serosanguinous fluid Complications: No apparent complications Patient did tolerate procedure well.   [name]. 08/19/2013  1. BAL un impresive 2. No lesions, erythema 3. Mild secretions  [name]. Titus Mould, MD, [place] Pgr: [facility]

## 2013-08-19 NOTE — Procedures (Signed)
Arterial Catheter Insertion Procedure Note [name] [id number] 1941-07-17  Procedure: Insertion of Arterial Catheter  Indications: Blood pressure monitoring  Procedure Details Consent: Unable to obtain consent because of altered level of consciousness. Time Out: Verified patient identification, verified procedure, site/side was marked, verified correct patient position, special equipment/implants available, medications/allergies/relevent history reviewed, required imaging and test results available.  Time out Performed  Maximum sterile technique was used including antiseptics, cap, gloves, gown, hand hygiene and sheet. Skin prep: Chlorhexidine; local anesthetic administered 20 gauge catheter was inserted into right femoral artery using the Seldinger technique.  Evaluation Blood flow good; BP tracing good. Complications: No apparent complications.   Merton Border 08/19/2013

## 2013-08-19 NOTE — Progress Notes (Signed)
1.2 ml blue port  Heparin removed and 1.2 mL heparin removed from red port in HD cath to begin CRRT.  Desmond Dike RN

## 2013-08-19 NOTE — Progress Notes (Signed)
CRRT started at [facility], bair hugger on core temp 96.5, crrt blood warmer on.  Desmond Dike RN

## 2013-08-19 NOTE — Progress Notes (Signed)
During the evening, patient has been hypotensive on Neosynephrine, overall color has been ashen. Mostly unarousable even when off Fentanyl for sedation. Abd CT with oral contrast complete without complication. Brandi NP notified wife of patients status and she said that  She "wanted everything done". Later, hemodialysis catheter and right femoral aline placed by Brandi NP and Dr. Alva Garnet respectively.  Wife was unavailable during this time and voice mail was left to notify her of emergent procedures and pending patient transfer to [facility] MICU.

## 2013-08-19 NOTE — Consult Note (Signed)
Referring Provider: No ref. provider found Primary Care Physician:  Beacher May, MD Primary Nephrologist:  .   Reason for Consultation: Shock hypotension  Acute renal failure Hyperkalemia and metabolic acidosis  [id number] is a 73 y.o. male with a past medical history significant for COPD, peripheral arterial disease status post aortobifemoral bypass, status post partial lung resection for lung cancer in 2009 admitted for acute exacerbation of COPD with acute respiratory insufficiency. Developed respiratory failure, shock and multiorgan failure. Transferred fro CRRT.   Past Medical History  Diagnosis Date  . Coronary artery disease   . Cancer     lung    Past Surgical History  Procedure Laterality Date  . Cataract extraction    . Venous bypass    . Lung removal, partial      Prior to Admission medications   Medication Sig Start Date End Date Taking? Authorizing Provider  albuterol (PROVENTIL HFA;VENTOLIN HFA) 108 (90 BASE) MCG/ACT inhaler Inhale 2 puffs into the lungs 2 (two) times daily as needed for wheezing.    Yes Historical Provider, MD  aspirin EC 81 MG tablet Take 81 mg by mouth daily.   Yes Historical Provider, MD  buPROPion (WELLBUTRIN SR) 150 MG 12 hr tablet Take 150 mg by mouth daily.    Yes Historical Provider, MD  clopidogrel (PLAVIX) 75 MG tablet Take 75 mg by mouth daily.   Yes Historical Provider, MD  dicyclomine (BENTYL) 10 MG capsule Take 10 mg by mouth 4 (four) times daily -  before meals and at bedtime.   Yes Historical Provider, MD  doxycycline (ADOXA) 50 MG tablet Take 50 mg by mouth 2 (two) times daily.   Yes Historical Provider, MD  naproxen sodium (ANAPROX) 220 MG tablet Take 440 mg by mouth as needed (pain.).   Yes Historical Provider, MD  pantoprazole (PROTONIX) 20 MG tablet Take 20 mg by mouth daily.   Yes Historical Provider, MD  simvastatin (ZOCOR) 40 MG tablet Take 40 mg by mouth every evening.   Yes Historical Provider, MD  tiotropium (SPIRIVA) 18  MCG inhalation capsule Place 18 mcg into inhaler and inhale every morning.   Yes Historical Provider, MD    Current Facility-Administered Medications  Medication Dose Route Frequency Provider Last Rate Last Dose  . 0.9 %  sodium chloride infusion  250 mL Intravenous PRN Wilhelmina Mcardle, MD      . antiseptic oral rinse (BIOTENE) solution 15 mL  15 mL Mouth Rinse q12n4p Wilhelmina Mcardle, MD   15 mL at 08/18/13 1600  . aspirin chewable tablet 81 mg  81 mg Oral Daily Wilhelmina Mcardle, MD   81 mg at 08/18/13 0930  . budesonide (PULMICORT) nebulizer solution 0.25 mg  0.25 mg Nebulization Q6H Wilhelmina Mcardle, MD   0.25 mg at 08/19/13 0233  . chlorhexidine (PERIDEX) 0.12 % solution 15 mL  15 mL Mouth Rinse BID Wilhelmina Mcardle, MD   15 mL at 08/18/13 2100  . clopidogrel (PLAVIX) tablet 75 mg  75 mg Oral Q breakfast Wilhelmina Mcardle, MD   75 mg at 08/18/13 0929  . fentaNYL (SUBLIMAZE) 10 mcg/mL in sodium chloride 0.9 % 250 mL infusion  0-400 mcg/hr Intravenous Continuous Chesley Mires, MD 10 mL/hr at 08/19/13 0200 100 mcg/hr at 08/19/13 0200  . fentaNYL (SUBLIMAZE) bolus via infusion 25-50 mcg  25-50 mcg Intravenous Q1H PRN Chesley Mires, MD      . heparin ADULT infusion 100 units/mL (25000 units/250 mL)  900 Units/hr Intravenous  Continuous Dorrene German, RPH 9 mL/hr at 08/18/13 2344 900 Units/hr at 08/18/13 2344  . heparin injection 1,000-6,000 Units  1,000-6,000 Units CRRT PRN Sherril Croon, MD      . hydrocortisone sodium succinate (SOLU-CORTEF) 100 MG injection 50 mg  50 mg Intravenous Q6H Wilhelmina Mcardle, MD   50 mg at 08/19/13 0700  . insulin aspart (novoLOG) injection 0-9 Units  0-9 Units Subcutaneous Q4H Wilhelmina Mcardle, MD      . ipratropium (ATROVENT) nebulizer solution 0.5 mg  0.5 mg Nebulization Q6H Wilhelmina Mcardle, MD   0.5 mg at 08/19/13 0233  . levalbuterol (XOPENEX) nebulizer solution 0.63 mg  0.63 mg Nebulization Q6H Wilhelmina Mcardle, MD   0.63 mg at 08/19/13 0234  . levalbuterol (XOPENEX)  nebulizer solution 0.63 mg  0.63 mg Nebulization Q3H PRN Wilhelmina Mcardle, MD      . LORazepam (ATIVAN) injection 0.5-1 mg  0.5-1 mg Intravenous Q4H PRN Juanito Doom, MD   1 mg at 08/16/13 2334  . milrinone [facility]) infusion 200 mcg/mL (0.2 mg/ml)  0.25 mcg/kg/min Intravenous Continuous Dorrene German, RPH 4.8 mL/hr at 08/18/13 2337 0.25 mcg/kg/min at 08/18/13 2337  . pantoprazole (PROTONIX) injection 40 mg  40 mg Intravenous Q24H Chesley Mires, MD   40 mg at 08/19/13 0113  . phenylephrine (NEO-SYNEPHRINE) 40 mg in dextrose 5 % 250 mL infusion  30-200 mcg/min Intravenous Continuous Wilhelmina Mcardle, MD 15 mL/hr at 08/19/13 0344 40 mcg/min at 08/19/13 0344  . piperacillin-tazobactam (ZOSYN) IVPB 3.375 g  3.375 g Intravenous Q8H Wilhelmina Mcardle, MD   3.375 g at 08/19/13 0012  . prismasol BGK 4/2.5 5,000 mL dialysis replacement fluid   CRRT Continuous Sherril Croon, MD 200 mL/hr at 08/19/13 0603    . prismasol BGK 4/2.5 5,000 mL dialysis replacement fluid   CRRT Continuous Sherril Croon, MD 300 mL/hr at 08/18/13 2330    . prismasol BGK 4/2.5 5,000 mL dialysis solution   CRRT Continuous Sherril Croon, MD 2,000 mL/hr at 08/19/13 [phone]    . sodium bicarbonate 150 mEq in dextrose 5 % 1,000 mL infusion   Intravenous Continuous Chesley Mires, MD 125 mL/hr at 08/18/13 2214    . sodium chloride 0.9 % primer fluid for CRRT   CRRT PRN Sherril Croon, MD      . vancomycin (VANCOCIN) IVPB 1000 mg/200 mL premix  1,000 mg Intravenous Q24H Wilhelmina Mcardle, MD   1,000 mg at 08/18/13 2122  . vasopressin (PITRESSIN) 50 Units in sodium chloride 0.9 % 250 mL infusion  0.03 Units/min Intravenous Continuous Chesley Mires, MD 9 mL/hr at 08/18/13 1923 0.03 Units/min at 08/18/13 1923    Allergies as of 08/16/2013 - Review Complete 08/16/2013  Allergen Reaction Noted  . Lactose intolerance (gi)  08/16/2013  . Shellfish allergy Diarrhea and Nausea And Vomiting 08/16/2013  . Strawberry Rash 08/16/2013    History reviewed. No  pertinent family history.  History   Social History  . Marital Status: Married    Spouse Name: N/A    Number of Children: N/A  . Years of Education: N/A   Occupational History  . Not on file.   Social History Main Topics  . Smoking status: Current Every Day Smoker -- 0.50 packs/day    Types: Cigarettes  . Smokeless tobacco: Not on file  . Alcohol Use: Yes     Comment: social  . Drug Use: No  . Sexual Activity: Not on file  Other Topics Concern  . Not on file   Social History Narrative  . No narrative on file    Review of Systems:  Unobtainable patient on ventilator and sedated in ICU  Physical Exam: Vital signs in last 24 hours: Temp:  [90.1 F (32.3 C)-97.9 F (36.6 C)] 96.2 F (35.7 C) (01/30 0700) Pulse Rate:  [47-107] 77 (01/30 0700) Resp:  [0-26] 16 (01/30 0700) BP: (46-125)/(23-86) 95/43 mmHg (01/30 0700) SpO2:  [86 %-100 %] 97 % (01/30 0700) Arterial Line BP: (108-124)/(41-46) 108/43 mmHg (01/30 0700) FiO2 (%):  [40 %] 40 % (01/30 0503) Weight:  [71.2 kg (156 lb 15.5 oz)] 71.2 kg (156 lb 15.5 oz) (01/30 0500) Last BM Date: 08/16/13 General:   Sedated on ventilator  Bare hugger  Head:  Normocephalic and atraumatic. Eyes:  Sclera clear, no icterus.   Conjunctiva pink. Ears:  Normal auditory acuity. Nose:  No deformity, discharge,  or lesions. Mouth:  No deformity or lesions, dentition normal.Vent OGT Neck:  Supple; no masses or thyromegaly. JVP not elevated Lungs:  Clear throughout to auscultation.   No wheezes, crackles, or rhonchi. No acute distress. Heart:  Irregular rate and rhythm; no murmurs, clicks, rubs,  or gallops. Abdomen:  Soft, nontender and nondistended. Hypoactive bowel sounds  Msk:  Symmetrical without gross deformities. Normal posture. Pulses:  No carotid, renal, femoral bruits. DP and PT symmetrical and equal Extremities:  Without clubbing or edema. Skin:  Intact without significant lesions or rashes.   Intake/Output from previous  day: 01/29 0701 - 01/30 0700 In: 4474.2 [I.V.:3284.2; NG/GT:330; IV Piggyback:860] Out: 689 [Urine:140; Emesis/NG output:400] Intake/Output this shift:    Lab Results:  Recent Labs  08/18/13 0527 08/18/13 2140 08/19/13 0320  WBC 21.1* 28.6* 29.1*  HGB 13.5 13.5 12.5*  HCT 42.4 43.7 38.9*  PLT 136* 120* 113*   BMET  Recent Labs  08/17/13 0305 08/18/13 0527 08/18/13 2140 08/19/13 0320  NA 136* 136* 132* 135*  K 4.2 4.8 6.3* 4.4  CL 103 102 100 100  CO2 17* 15* 17* 18*  GLUCOSE 211* 240* 298* 209*  BUN 17 29* 39* 39*  CREATININE 0.70 1.57* 2.53* 2.61*  CALCIUM 7.5* 8.0* 7.0* 7.0*  PHOS  --  5.7*  --  6.9*   LFT  Recent Labs  08/19/13 0320  PROT 4.6*  ALBUMIN 2.5*  AST 936*  ALT 829*  ALKPHOS 60  BILITOT 0.7   PT/INR  Recent Labs  08/18/13 0025  LABPROT 26.0*  INR 2.48*   Hepatitis Panel No results found for this basename: HEPBSAG, HCVAB, HEPAIGM, HEPBIGM,  in the last 72 hours  Studies/Results: Ct Abdomen Pelvis Wo Contrast  08/18/2013   CLINICAL DATA:  Assess for intra-abdominal infection  EXAM: CT ABDOMEN AND PELVIS WITHOUT CONTRAST  TECHNIQUE: Multidetector CT imaging of the abdomen and pelvis was performed following the standard protocol without intravenous contrast.  COMPARISON:  Prior radiograph from 08/18/2013  FINDINGS: A moderate left pleural effusion with associated compressive atelectasis is present. Small right pleural effusion with associated atelectasis is also noted. No definite focal infiltrate seen within the partially visualized lung bases. Emphysematous changes noted.  Cardiomegaly partially visualized. No pericardial effusion. A few scattered atherosclerotic plaques noted within the partially visualized left anterior descending coronary artery.  Limited noncontrast evaluation liver is grossly unremarkable. Hyperdensity is seen within the nondilated gallbladder, which may represent vicarious excretion of contrast. More hyperdense layering  stones are seen within the gallbladder lumen. There is prominent gallbladder wall thickening  with small amount of pericholecystic fluid. No definite biliary ductal dilatation identified.  The spleen is within normal limits. Adrenal glands are unremarkable. Limited noncontrast evaluation of the pancreas is grossly unremarkable.  Enteric tube is coiled within the stomach. There is a small amount of enteric contrast material within the gastric lumen and proximal small bowel. There is question of circumferential bladder wall thickening within the second, third, and fourth portions of the duodenum (series 2, image 42). This finding is somewhat poorly evaluated due to small amount of enteric contrast as well as lack of IV contrast, and may be in part related to incomplete distension. No evidence of bowel obstruction. Scattered colonic diverticula are present without definite evidence of acute diverticulitis. No pneumatosis or portal venous gas identified. No definite inflammatory changes seen about the bowels, although evaluation is limited due to lack of IV contrast. Appendix is normal.  The bladder is decompressed with a Foley catheter in place small amount of gas lucency within the nondependent portion of the bladder lumen is most compatible with catheterization. Prostate is normal.  Moderate volume ascites is present within the abdomen. No definite loculated fluid collection identified. No free air identified.  Vascular stents noted at the origin of the celiac access in within the proximal SMA. Additional iliac stents noted as well. Surgical clips seen about the common femoral arteries bilaterally. 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 Soft tissue density within the adjacent. Soft tissues is likely postsurgical in nature.  No enlarged intra-abdominal pelvic lymph nodes are identified.  Diffuse anasarca is noted. Small focus of subcutaneous emphysema noted  within the subcutaneous fat of the anterior mid abdomen (series 2, image 46), which may be iatrogenic.  Multilevel degenerative disc disease noted within the visualized spine with prominent facet arthrosis at L3 through S1.  IMPRESSION: 1. Question circumferential bowel wall thickening versus incomplete distention within the duodenum as above, nonspecific, but may represent sequelae of infection or inflammation. Possible ischemic bowel could also be considered in the correct clinical setting. No portal venous gas, free air, or pneumatosis identified. 2. Moderate volume ascites. No definite loculated fluid collection identified. 3. Left greater than right bilateral pleural effusions with associated bibasilar atelectasis. 4. Cholelithiasis with circumferential gallbladder wall thickening and/or pericholecystic fluid. Finding may be related to overall volume status. Clinical correlation and/or right upper quadrant ultrasound could be performed if there is clinical concern for acute cholecystitis. No biliary ductal dilatation identified. 5. Colonic diverticulosis without acute diverticulitis. 6. 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 7. 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 8. Moderate cardiomegaly. 9. Diffuse anasarca.   Electronically Signed   By: Jeannine Boga M.D.   On: 08/18/2013 23:49   Dg Chest Port 1 View  08/19/2013   CLINICAL DATA:  Line placement  EXAM: PORTABLE CHEST - 1 VIEW  COMPARISON:  08/18/2013  FINDINGS: New right IJ temporary dialysis time catheter noted, tip mid SVC. Endotracheal to 3 cm above the carina. NG tube extends into the stomach. Cardiomegaly evident with vascular congestion and dense left lower lobe collapse/consolidation. Postop changes in the right lung with associated volume loss. No enlarging effusion or pneumothorax.  IMPRESSION: New right IJ temporary  dialysis catheter, tip mid SVC level  No pneumothorax.  No other significant change   Electronically Signed   By: Tamera Punt.D.  On: 08/19/2013 01:20   Dg Chest Port 1 View  08/18/2013   CLINICAL DATA:  Acute on chronic respiratory failure. Endotracheal tube position.  EXAM: PORTABLE CHEST - 1 VIEW  COMPARISON:  08/17/2013  FINDINGS: Endotracheal tube tip is 16 mm above the carina and directed toward the right. Central venous catheter and NG tube appear in good position.  There is a new area of infiltrate or atelectasis at the left lung base. There is slight pulmonary vascular congestion which is new. Chronic changes throughout the right lung are stable.  IMPRESSION: 1. Endotracheal tube is 16 mm above the carina and directed toward the right. 2. New atelectasis/infiltrate at the left lung base. 3. New slight pulmonary vascular congestion.   Electronically Signed   By: Rozetta Nunnery M.D.   On: 08/18/2013 07:36   Dg Chest Port 1 View  08/17/2013   CLINICAL DATA:  Central line and endotracheal tube placement. Acute on chronic respiratory failure. COPD.  EXAM: PORTABLE CHEST - 1 VIEW 3:09 p.m.  COMPARISON:  08/17/2013 at 5:13 a.m.  FINDINGS: Endotracheal tube tip is 2.4 cm above the carina. Left jugular vein catheter tip is in the superior vena cava.  There is chronic elevation of the right hemidiaphragm with chronic blunting of the right costophrenic angle. There is pulmonary vascular congestion. No effusions.  IMPRESSION: Endotracheal tube and central line in good position. New pulmonary vascular congestion.   Electronically Signed   By: Rozetta Nunnery M.D.   On: 08/17/2013 15:28   Dg Abd Portable 1v  08/18/2013   CLINICAL DATA:  Abdominal distension  EXAM: PORTABLE ABDOMEN - 1 VIEW  COMPARISON:  None.  FINDINGS: Nonobstructive bowel gas pattern. Unremarkable colonic stool burden. Nondiagnostic evaluation for pneumoperitoneum secondary to supine positioning and exclusion of the lower thorax. No definite  pneumatosis or portal venous gas. Enteric tube tip and side port overlie the expected location of the gastric fundus.  Vascular stents overlie the expected location of the left common iliac artery, SMA and possibly the celiac artery. Multiple surgical clips overlie the bilateral groins. A for Mr. overlies expected location of the urinary bladder.  Mild multilevel DDD is suspected within the lumbar spine, incompletely evaluated. No acute osseus abnormalities.  IMPRESSION: Nonobstructive bowel gas pattern. Unremarkable colonic stool burden.   Electronically Signed   By: Sandi Mariscal M.D.   On: 08/18/2013 19:08    Assessment/Plan: The patient is critically ill with multiple organ systems failure. Acute on chronic respiratory failure, COPD and  Hx of lung cancer s/p resection 2009. Required intubation for acute respiratory distress 1/28. Atrial fibrillation rapid ventricular response developed during admission and 2D echo consistent with systolic heart failure. EF 25 %. Patient became oliguric and hypotensive 1/29 with poor response to volume administration. Diltiazem and amiodarone started 1/28. Neosynephrine and vasopressin started. Patient became less rousable  Increase intrabdominal pressures noted, ascites and Vascular stents noted at the origin of the celiac access in within the proximal SMA. Additional iliac stents noted as well. Question circumferential bowel wall thickening versus incomplete distention within the duodenum on CT scan noted. Labs drawn indicated profound acidosis with a metabolic component in addition to hyperkalemia, He was transferred to [facility] for additional monitoring as well as CRRT.  1. Shock with significant lactic acidosis and pressor dependent possibilities include sepsis or cardiogenic shock. Ischemic bowel also remains a possibilty  2. Hypotension pressor dependent  Even though clinically volume overloaded will not be able to pull fluid at this time  3. Hyperkalemia  resolved with medical management 4 m Eq K  Replacement fluid and dialysis fluid  4. No Heparin anticoagulation  5. Metabolic acidosis  IV bicarbonate drip resolving        LOS: 3 [name] @TODAY @7 :55 AM

## 2013-08-20 ENCOUNTER — Inpatient Hospital Stay (HOSPITAL_COMMUNITY): Payer: Medicare Other

## 2013-08-20 LAB — CBC WITH DIFFERENTIAL/PLATELET
BASOS ABS: 0 10*3/uL (ref 0.0–0.1)
Basophils Relative: 0 % (ref 0–1)
Eosinophils Absolute: 0 10*3/uL (ref 0.0–0.7)
Eosinophils Relative: 0 % (ref 0–5)
HCT: 37.4 % — ABNORMAL LOW (ref 39.0–52.0)
Hemoglobin: 12.4 g/dL — ABNORMAL LOW (ref 13.0–17.0)
Lymphocytes Relative: 36 % (ref 12–46)
Lymphs Abs: 9.1 10*3/uL — ABNORMAL HIGH (ref 0.7–4.0)
MCH: 30.5 pg (ref 26.0–34.0)
MCHC: 33.2 g/dL (ref 30.0–36.0)
MCV: 92.1 fL (ref 78.0–100.0)
MONO ABS: 0.8 10*3/uL (ref 0.1–1.0)
MONOS PCT: 3 % (ref 3–12)
Neutro Abs: 15.3 10*3/uL — ABNORMAL HIGH (ref 1.7–7.7)
Neutrophils Relative %: 61 % (ref 43–77)
PLATELETS: 90 10*3/uL — AB (ref 150–400)
RBC: 4.06 MIL/uL — ABNORMAL LOW (ref 4.22–5.81)
RDW: 15.1 % (ref 11.5–15.5)
WBC: 25.2 10*3/uL — AB (ref 4.0–10.5)

## 2013-08-20 LAB — GLUCOSE, CAPILLARY
GLUCOSE-CAPILLARY: 115 mg/dL — AB (ref 70–99)
Glucose-Capillary: 102 mg/dL — ABNORMAL HIGH (ref 70–99)
Glucose-Capillary: 107 mg/dL — ABNORMAL HIGH (ref 70–99)
Glucose-Capillary: 120 mg/dL — ABNORMAL HIGH (ref 70–99)
Glucose-Capillary: 90 mg/dL (ref 70–99)
Glucose-Capillary: 99 mg/dL (ref 70–99)

## 2013-08-20 LAB — BODY FLUID CELL COUNT WITH DIFFERENTIAL
Eos, Fluid: 0 %
Lymphs, Fluid: 3 %
Monocyte-Macrophage-Serous Fluid: 12 % — ABNORMAL LOW (ref 50–90)
Neutrophil Count, Fluid: 85 % — ABNORMAL HIGH (ref 0–25)
Total Nucleated Cell Count, Fluid: 40 cu mm (ref 0–1000)

## 2013-08-20 LAB — RENAL FUNCTION PANEL
Albumin: 2.8 g/dL — ABNORMAL LOW (ref 3.5–5.2)
BUN: 22 mg/dL (ref 6–23)
CALCIUM: 7.3 mg/dL — AB (ref 8.4–10.5)
CO2: 23 mEq/L (ref 19–32)
Chloride: 98 mEq/L (ref 96–112)
Creatinine, Ser: 1.13 mg/dL (ref 0.50–1.35)
GFR calc Af Amer: 73 mL/min — ABNORMAL LOW (ref >90)
GFR, EST NON AFRICAN AMERICAN: 63 mL/min — AB (ref >90)
Glucose, Bld: 127 mg/dL — ABNORMAL HIGH (ref 70–99)
PHOSPHORUS: 3 mg/dL (ref 2.3–4.6)
Potassium: 4.2 mEq/L (ref 3.7–5.3)
SODIUM: 135 meq/L — AB (ref 137–147)

## 2013-08-20 LAB — CULTURE, RESPIRATORY: SPECIAL REQUESTS: NORMAL

## 2013-08-20 LAB — CULTURE, RESPIRATORY W GRAM STAIN

## 2013-08-20 LAB — PHOSPHORUS: PHOSPHORUS: 2.5 mg/dL (ref 2.3–4.6)

## 2013-08-20 LAB — HEPATIC FUNCTION PANEL
ALBUMIN: 3 g/dL — AB (ref 3.5–5.2)
ALT: 1491 U/L — ABNORMAL HIGH (ref 0–53)
AST: 1052 U/L — ABNORMAL HIGH (ref 0–37)
Alkaline Phosphatase: 62 U/L (ref 39–117)
BILIRUBIN DIRECT: 0.6 mg/dL — AB (ref 0.0–0.3)
Indirect Bilirubin: 0.8 mg/dL (ref 0.3–0.9)
Total Bilirubin: 1.4 mg/dL — ABNORMAL HIGH (ref 0.3–1.2)
Total Protein: 5.2 g/dL — ABNORMAL LOW (ref 6.0–8.3)

## 2013-08-20 LAB — BASIC METABOLIC PANEL
BUN: 21 mg/dL (ref 6–23)
CO2: 25 mEq/L (ref 19–32)
Calcium: 7.1 mg/dL — ABNORMAL LOW (ref 8.4–10.5)
Chloride: 99 mEq/L (ref 96–112)
Creatinine, Ser: 1.27 mg/dL (ref 0.50–1.35)
GFR calc non Af Amer: 54 mL/min — ABNORMAL LOW (ref >90)
GFR, EST AFRICAN AMERICAN: 63 mL/min — AB (ref >90)
Glucose, Bld: 91 mg/dL (ref 70–99)
POTASSIUM: 4.6 meq/L (ref 3.7–5.3)
SODIUM: 137 meq/L (ref 137–147)

## 2013-08-20 LAB — URINE CULTURE
COLONY COUNT: NO GROWTH
Culture: NO GROWTH

## 2013-08-20 LAB — MAGNESIUM: Magnesium: 2.1 mg/dL (ref 1.5–2.5)

## 2013-08-20 LAB — HEPARIN LEVEL (UNFRACTIONATED): Heparin Unfractionated: 0.64 IU/mL (ref 0.30–0.70)

## 2013-08-20 MED ORDER — SODIUM PHOSPHATE 3 MMOLE/ML IV SOLN
10.0000 mmol | Freq: Once | INTRAVENOUS | Status: AC
  Administered 2013-08-20: 10 mmol via INTRAVENOUS
  Filled 2013-08-20: qty 3.33

## 2013-08-20 MED ORDER — HEPARIN (PORCINE) IN NACL 100-0.45 UNIT/ML-% IJ SOLN
900.0000 [IU]/h | INTRAMUSCULAR | Status: DC
  Administered 2013-08-20 – 2013-08-21 (×2): 850 [IU]/h via INTRAVENOUS
  Filled 2013-08-20: qty 250

## 2013-08-20 NOTE — Progress Notes (Signed)
Patient Name: [name] Date of Encounter: 08/20/2013  Principal Problem:   Acute on chronic respiratory failure Active Problems:   Acute exacerbation of chronic obstructive pulmonary disease (COPD)   History of lung cancer, s/p RUL resection 2009 @ [place]   Mediastinal adenopathy   Smoker   Atrial fibrillation with RVR   PRWP on ECG   New onset atrial fibrillation   Chest tightness   Peripheral vascular disease   Vascular graft infection, on chronic doxycycline   Thrombocytopenia   Acute respiratory failure   Acute combined systolic and diastolic heart failure   Septic shock(785.52)   Length of Stay: 4  SUBJECTIVE 4 M with COPD, hx of lung ca, PVD, current smoker presented to [facility] ED with 2 wks of progressive dyspnea and chest tightness minimally and transiently responsive to albuterol MDI. Developed afib rvr, abdominal process, resp failure, ETT placed and renal failure, sepsis.  Remains intubated, on pressors, critically ill. However, pressor doses are much lower than yesterday and he may wean off completely today. Back in AF this AM - no real change in hemodynamics with the arrhythmia  CURRENT MEDS . antiseptic oral rinse  15 mL Mouth Rinse q12n4p  . aspirin  81 mg Oral Daily  . budesonide  0.25 mg Nebulization Q6H  . chlorhexidine  15 mL Mouth Rinse BID  . clopidogrel  75 mg Oral Q breakfast  . hydrocortisone sod succinate (SOLU-CORTEF) inj  50 mg Intravenous Q6H  . insulin aspart  0-9 Units Subcutaneous Q4H  . ipratropium  0.5 mg Nebulization Q6H  . levalbuterol  0.63 mg Nebulization Q6H  . pantoprazole (PROTONIX) IV  40 mg Intravenous Q24H  . piperacillin-tazobactam  3.375 g Intravenous Q6H  . sodium phosphate  Dextrose 5% IVPB  10 mmol Intravenous Once  . vancomycin  1,000 mg Intravenous Q24H    OBJECTIVE  Filed Vitals:   08/20/13 0645 08/20/13 0800 08/20/13 0818 08/20/13 1140  BP:  122/45  131/60  Pulse: 82 79  81  Temp: 97.7 F (36.5 C) 97.5 F (36.4  C)    TempSrc:      Resp: 24 24  24   Height:      Weight:      SpO2: 100% 96% 99% 100%    Intake/Output Summary (Last 24 hours) at 08/20/13 1249 Last data filed at 08/20/13 1200  Gross per 24 hour  Intake 2433.94 ml  Output   2727 ml  Net -293.06 ml   Filed Weights   08/17/13 0400 08/19/13 0500 08/20/13 0500  Weight: 63.7 kg (140 lb 6.9 oz) 71.2 kg (156 lb 15.5 oz) 72.3 kg (159 lb 6.3 oz)    PHYSICAL EXAM  General: arousable, calm, intubated Head: no evidence of trauma, PERRL, EOMI, no exophtalmos or lid lag, no myxedema, no xanthelasma; normal ears, nose and oropharynx Neck: normal jugular venous pulsations and no hepatojugular reflux; brisk carotid pulses without delay and no carotid bruits Chest: clear to auscultation, no signs of consolidation by percussion or palpation, normal fremitus, symmetrical and full respiratory excursions Cardiovascular: normal position and quality of the apical impulse, irregular rhythm, normal first and second heart sounds, no murmurs, rubs or gallops Abdomen: no tenderness or distention, no masses by palpation, no abnormal pulsatility or arterial bruits, normal bowel sounds, no hepatosplenomegaly Extremities: no clubbing, cyanosis or edema; 2+ radial, ulnar and brachial pulses bilaterally; 2+ right femoral, posterior tibial and dorsalis pedis pulses; 2+ left femoral, posterior tibial and dorsalis pedis pulses; no subclavian or  femoral bruits Neurological: grossly nonfocal    CBC  Recent Labs  08/19/13 0320 08/20/13 0400  WBC 29.1* 25.2*  NEUTROABS 16.9* 15.3*  HGB 12.5* 12.4*  HCT 38.9* 37.4*  MCV 95.6 92.1  PLT 113* 90*   Basic Metabolic Panel  Recent Labs  08/19/13 0320 08/19/13 1618 08/20/13 0400  NA 135* 133* 137  K 4.4 4.2 4.6  CL 100 97 99  CO2 18* 23 25  GLUCOSE 209* 153* 91  BUN 39* 27* 21  CREATININE 2.61* 1.66* 1.27  CALCIUM 7.0* 6.7* 7.1*  MG 1.7  --  2.1  PHOS 6.9* 2.7 2.5   Liver Function Tests  Recent  Labs  08/19/13 0320 08/19/13 1618 08/20/13 0400  AST 936*  --  1052*  ALT 829*  --  1491*  ALKPHOS 60  --  62  BILITOT 0.7  --  1.4*  PROT 4.6*  --  5.2*  ALBUMIN 2.5* 2.7* 3.0*    Recent Labs  08/19/13 0320  LIPASE 46   Cardiac Enzymes  Recent Labs  08/17/13 2240 08/18/13 0527 08/18/13 2140  TROPONINI <0.30 <0.30 <0.30   Hemoglobin A1C  Recent Labs  08/18/13 0527  HGBA1C 5.8*    TELE  AF with mild RVR onset at 1150h  ECG 01/29 - lateral Q waves , was in NSR until today    ASSESSMENT AND PLAN Substantial hemodynamic improvement, notwithstanding recurrent AF. Elevated LFTS probably secondary to hypertension, "shock liver" Since AF is well tolerated and rate is relatively slow, the risk of amiodarone further worsening liver abnormalities exceeds the benefit.  His shock appears to be resolving and once off pressors and if BP permits, add low dose beta blocker. Use a cardioselective agent (metoprolol) due to reactive airway disease. Avoid diltiazem- negative inotrope. Digoxin is another option if BP low, but will require careful monitoring with his liver and renal abnormalities, CVVH.   Sanda Klein, MD, [facility] [phone] office [phone] pager 08/20/2013 12:49 PM

## 2013-08-20 NOTE — Progress Notes (Signed)
ANTICOAGULATION CONSULT NOTE - Follow Up Consult  Pharmacy Consult:  Heparin Indication: atrial fibrillation  Allergies  Allergen Reactions  . Lactose Intolerance (Gi)   . Shellfish Allergy Diarrhea and Nausea And Vomiting  . Strawberry Rash    Patient Measurements: Height: 5\' 9"  (175.3 cm) Weight: 159 lb 6.3 oz (72.3 kg) IBW/kg (Calculated) : 70.7 Heparin Dosing Weight: 72 kg  Vital Signs: Temp: 97.5 F (36.4 C) (01/31 0800) Temp src: Core (Comment) (01/31 0600) BP: 131/60 mmHg (01/31 1140) Pulse Rate: 81 (01/31 1140)  Labs:  Recent Labs  08/17/13 2240 08/18/13 0025  08/18/13 0527 08/18/13 2140 08/19/13 0320 08/19/13 0800 08/19/13 1618 08/20/13 0400  HGB  --   --   < > 13.5 13.5 12.5*  --   --  12.4*  HCT  --   --   < > 42.4 43.7 38.9*  --   --  37.4*  PLT  --   --   < > 136* 120* 113*  --   --  90*  APTT  --  51*  --   --   --   --   --   --   --   LABPROT  --  26.0*  --   --   --   --   --   --   --   INR  --  2.48*  --   --   --   --   --   --   --   HEPARINUNFRC  --   --   --   --   --   --  0.51 0.56 0.64  CREATININE  --   --   < > 1.57* 2.53* 2.61*  --  1.66* 1.27  TROPONINI <0.30  --   --  <0.30 <0.30  --   --   --   --   < > = values in this interval not displayed.  Estimated Creatinine Clearance: 51.8 ml/min (by C-G formula based on Cr of 1.27).     Assessment: 72 YOM on IV heparin for new Afib with RVR.  Heparin level therapeutic but is trending up toward the high end of normal.  No bleeding reported.  Noted AST/ALT are elevated and platelets has trended down.   Goal of Therapy:  Heparin level 0.3-0.7 units/ml Monitor platelets by anticoagulation protocol: Yes Vanc trough 15-20 mcg/mL    Plan:  - Decrease heparin gtt to 850 units/hr - Daily HL / CBC - Continue vanc 1gm IV Q24H and Zosyn 3.375gm IV Q6H - Monitor CRRT interruptions, clinical progression, vanc trough as indicated    Naphtali Riede D. Mina Marble, PharmD, BCPS Pager:  [phone] 08/20/2013, 1:12 PM

## 2013-08-20 NOTE — Progress Notes (Signed)
Subjective:  On vent  Objective Vital signs in last 24 hours: Filed Vitals:   08/20/13 0630 08/20/13 0645 08/20/13 0800 08/20/13 0818  BP:   122/45   Pulse: 81 82 79   Temp: 97.9 F (36.6 C) 97.7 F (36.5 C) 97.5 F (36.4 C)   TempSrc:      Resp: 24 24 24    Height:      Weight:      SpO2: 98% 100% 96% 99%   Weight change: 1.1 kg (2 lb 6.8 oz)  Intake/Output Summary (Last 24 hours) at 08/20/13 1144 Last data filed at 08/20/13 1100  Gross per 24 hour  Intake 2408.84 ml  Output   2827 ml  Net -418.16 ml    Physical Exam:  Blood pressure 122/45, pulse 79, temperature 97.5 F (36.4 C), temperature source Core (Comment), resp. rate 24, height 5\' 9"  (1.753 m), weight 72.3 kg (159 lb 6.3 oz), SpO2 99.00%.  General: Sedated on ventilator. CRRT via right ij temp cath (1/30) Eyes: Sclera clear, no icterus.  Mouth: orotracheal tube and OGT  Lungs: Coarse breath sounds Heart: Irregular rate and rhythm; no murmurs, clicks, rubs, or gallops.  Abdomen: Soft, nontender and nondistended. Hypoactive bowel sounds  Extremities: Trace pretibial edema  Labs: Basic Metabolic Panel:  Recent Labs Lab 08/16/13 1630 08/17/13 0305 08/18/13 0527 08/18/13 2140 08/19/13 0320 08/19/13 1618 08/20/13 0400  NA 138 136* 136* 132* 135* 133* 137  K 4.2 4.2 4.8 6.3* 4.4 4.2 4.6  CL 103 103 102 100 100 97 99  CO2 22 17* 15* 17* 18* 23 25  GLUCOSE 117* 211* 240* 298* 209* 153* 91  BUN 23 17 29* 39* 39* 27* 21  CREATININE 0.79 0.70 1.57* 2.53* 2.61* 1.66* 1.27  CALCIUM 8.6 7.5* 8.0* 7.0* 7.0* 6.7* 7.1*  PHOS  --   --  5.7*  --  6.9* 2.7 2.5     Recent Labs Lab 08/18/13 2140 08/19/13 0320 08/19/13 1618 08/20/13 0400  AST 160* 936*  --  1052*  ALT 172* 829*  --  1491*  ALKPHOS 65 60  --  62  BILITOT 0.6 0.7  --  1.4*  PROT 5.0* 4.6*  --  5.2*  ALBUMIN 2.7* 2.5* 2.7* 3.0*    Recent Labs Lab 08/19/13 0320  LIPASE 46   Recent Labs Lab 08/16/13 1630  08/18/13 0527 08/18/13 2140  08/19/13 0320 08/20/13 0400  WBC 8.1  < > 21.1* 28.6* 29.1* 25.2*  NEUTROABS 2.0  --  9.7*  --  16.9* 15.3*  HGB 12.7*  < > 13.5 13.5 12.5* 12.4*  HCT 38.0*  < > 42.4 43.7 38.9* 37.4*  MCV 91.8  < > 96.1 97.8 95.6 92.1  PLT 100*  < > 136* 120* 113* 90*  < > = values in this interval not displayed.  Recent Labs Lab 08/17/13 0305 08/17/13 1149 08/17/13 2240 08/18/13 0527 08/18/13 2140  TROPONINI <0.30 <0.30 <0.30 <0.30 <0.30    Recent Labs Lab 08/19/13 1533 08/19/13 1919 08/20/13 0012 08/20/13 0347 08/20/13 0715  GLUCAP 161* 119* 90 99 102*   Lab Results  Component Value Date   INR 2.48* 08/18/2013   INR 1.09 03/27/2010   INR 1.4 03/29/2008   Studies/Results: Ct Abdomen Pelvis Wo Contrast  08/18/2013   CLINICAL DATA:  Assess for intra-abdominal infection  EXAM: CT ABDOMEN AND PELVIS WITHOUT CONTRAST  TECHNIQUE: Multidetector CT imaging of the abdomen and pelvis was performed following the standard protocol without intravenous contrast.  COMPARISON:  Prior radiograph from 08/18/2013  FINDINGS: A moderate left pleural effusion with associated compressive atelectasis is present. Small right pleural effusion with associated atelectasis is also noted. No definite focal infiltrate seen within the partially visualized lung bases. Emphysematous changes noted.  Cardiomegaly partially visualized. No pericardial effusion. A few scattered atherosclerotic plaques noted within the partially visualized left anterior descending coronary artery.  Limited noncontrast evaluation liver is grossly unremarkable. Hyperdensity is seen within the nondilated gallbladder, which may represent vicarious excretion of contrast. More hyperdense layering stones are seen within the gallbladder lumen. There is prominent gallbladder wall thickening with small amount of pericholecystic fluid. No definite biliary ductal dilatation identified.  The spleen is within normal limits. Adrenal glands are unremarkable. Limited  noncontrast evaluation of the pancreas is grossly unremarkable.  Enteric tube is coiled within the stomach. There is a small amount of enteric contrast material within the gastric lumen and proximal small bowel. There is question of circumferential bladder wall thickening within the second, third, and fourth portions of the duodenum (series 2, image 42). This finding is somewhat poorly evaluated due to small amount of enteric contrast as well as lack of IV contrast, and may be in part related to incomplete distension. No evidence of bowel obstruction. Scattered colonic diverticula are present without definite evidence of acute diverticulitis. No pneumatosis or portal venous gas identified. No definite inflammatory changes seen about the bowels, although evaluation is limited due to lack of IV contrast. Appendix is normal.  The bladder is decompressed with a Foley catheter in place small amount of gas lucency within the nondependent portion of the bladder lumen is most compatible with catheterization. Prostate is normal.  Moderate volume ascites is present within the abdomen. No definite loculated fluid collection identified. No free air identified.  Vascular stents noted at the origin of the celiac access in within the proximal SMA. Additional iliac stents noted as well. Surgical clips seen about the common femoral arteries bilaterally. 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 Soft tissue density within the adjacent. Soft tissues is likely postsurgical in nature.  No enlarged intra-abdominal pelvic lymph nodes are identified.  Diffuse anasarca is noted. Small focus of subcutaneous emphysema noted within the subcutaneous fat of the anterior mid abdomen (series 2, image 46), which may be iatrogenic.  Multilevel degenerative disc disease noted within the visualized spine with prominent facet arthrosis at L3 through S1.  IMPRESSION: 1. Question circumferential  bowel wall thickening versus incomplete distention within the duodenum as above, nonspecific, but may represent sequelae of infection or inflammation. Possible ischemic bowel could also be considered in the correct clinical setting. No portal venous gas, free air, or pneumatosis identified. 2. Moderate volume ascites. No definite loculated fluid collection identified. 3. Left greater than right bilateral pleural effusions with associated bibasilar atelectasis. 4. Cholelithiasis with circumferential gallbladder wall thickening and/or pericholecystic fluid. Finding may be related to overall volume status. Clinical correlation and/or right upper quadrant ultrasound could be performed if there is clinical concern for acute cholecystitis. No biliary ductal dilatation identified. 5. Colonic diverticulosis without acute diverticulitis. 6. 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 7. 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 8. Moderate cardiomegaly. 9. Diffuse anasarca.   Electronically Signed   By: Jeannine Boga M.D.   On: 08/18/2013 23:49   Dg Chest [facility] 1 [address]  08/20/2013   CLINICAL DATA:  Acute respiratory failure. On ventilator.  EXAM: PORTABLE CHEST - 1 VIEW  COMPARISON:  08/19/2013  FINDINGS: Endotracheal tube tip is approximately 1.5 cm above the carina. Other support lines and tubes remain in appropriate position. No pneumothorax identified.  Increased opacity seen in the left retrocardiac lung base, which may be due to atelectasis or consolidation. Small right pleural effusion also shows mild increase in size. Left pleural effusion cannot be excluded on this portable exam.  IMPRESSION: Increased left retrocardiac atelectasis versus consolidation.  Mild increase in small right pleural effusion. Small left pleural effusion cannot be excluded on this portable exam.  Endotracheal tube tip  approximately 1.5 cm above carina.   Electronically Signed   By: Earle Gell M.D.   On: 08/20/2013 07:56   Dg Chest Port 1 View  08/19/2013   CLINICAL DATA:  Line placement  EXAM: PORTABLE CHEST - 1 VIEW  COMPARISON:  08/18/2013  FINDINGS: New right IJ temporary dialysis time catheter noted, tip mid SVC. Endotracheal to 3 cm above the carina. NG tube extends into the stomach. Cardiomegaly evident with vascular congestion and dense left lower lobe collapse/consolidation. Postop changes in the right lung with associated volume loss. No enlarging effusion or pneumothorax.  IMPRESSION: New right IJ temporary dialysis catheter, tip mid SVC level  No pneumothorax.  No other significant change   Electronically Signed   By: Daryll Brod M.D.   On: 08/19/2013 01:20   Dg Abd Portable 1v  08/18/2013   CLINICAL DATA:  Abdominal distension  EXAM: PORTABLE ABDOMEN - 1 VIEW  COMPARISON:  None.  FINDINGS: Nonobstructive bowel gas pattern. Unremarkable colonic stool burden. Nondiagnostic evaluation for pneumoperitoneum secondary to supine positioning and exclusion of the lower thorax. No definite pneumatosis or portal venous gas. Enteric tube tip and side port overlie the expected location of the gastric fundus.  Vascular stents overlie the expected location of the left common iliac artery, SMA and possibly the celiac artery. Multiple surgical clips overlie the bilateral groins. A for Mr. overlies expected location of the urinary bladder.  Mild multilevel DDD is suspected within the lumbar spine, incompletely evaluated. No acute osseus abnormalities.  IMPRESSION: Nonobstructive bowel gas pattern. Unremarkable colonic stool burden.   Electronically Signed   By: Sandi Mariscal M.D.   On: 08/18/2013 19:08   Medications: . sodium chloride 20 mL/hr (08/19/13 1243)  . fentaNYL infusion INTRAVENOUS 100 mcg/hr (08/19/13 2204)  . heparin 900 Units/hr (08/20/13 0155)  . phenylephrine (NEO-SYNEPHRINE) Adult infusion 50 mcg/min  (08/20/13 3016)  . dialysis replacement fluid (prismasate) 200 mL/hr at 08/20/13 0706  . dialysis replacement fluid (prismasate) 300 mL/hr at 08/19/13 2311  . dialysate (PRISMASATE) 2,000 mL/hr at 08/20/13 1021  . vasopressin (PITRESSIN) infusion - *FOR SHOCK* 0.03 Units/min (08/19/13 2205)   . antiseptic oral rinse  15 mL Mouth Rinse q12n4p  . aspirin  81 mg Oral Daily  . budesonide  0.25 mg Nebulization Q6H  . chlorhexidine  15 mL Mouth Rinse BID  . clopidogrel  75 mg Oral Q breakfast  . hydrocortisone sod succinate (SOLU-CORTEF) inj  50 mg Intravenous Q6H  . insulin aspart  0-9 Units Subcutaneous Q4H  . ipratropium  0.5 mg Nebulization Q6H  . levalbuterol  0.63 mg Nebulization Q6H  . pantoprazole (PROTONIX) IV  40 mg Intravenous Q24H  . piperacillin-tazobactam  3.375 g Intravenous Q6H  . vancomycin  1,000 mg Intravenous Q24H   . sodium chloride 20 mL/hr (08/19/13 1243)  .  fentaNYL infusion INTRAVENOUS 100 mcg/hr (08/19/13 2204)  . heparin 900 Units/hr (08/20/13 0155)  . phenylephrine (NEO-SYNEPHRINE) Adult infusion 50 mcg/min (08/20/13 [id number])  . dialysis replacement fluid (prismasate) 200 mL/hr at 08/20/13 0706  . dialysis replacement fluid (prismasate) 300 mL/hr at 08/19/13 2311  . dialysate (PRISMASATE) 2,000 mL/hr at 08/20/13 1021  . vasopressin (PITRESSIN) infusion - *FOR SHOCK* 0.03 Units/min (08/19/13 2205)    I  have reviewed scheduled and prn medications. Assessment/Plan 73 yo with acute on chronic respiratory failure, COPD, h/o lung CA resection, systolic heart failure (EF 25%), AF with RVR;  Shock liver; subsequent development of oliguric AKI, metabolic acidosis, hyperkalemia in the setting of respiratory failure, pressor dependent hypotension, IV contrast (1/27). On NSAIDs PTA  (Baseline creatinine 0.7).  CRRT initiated 08/19/13.  Acute renal failure - oliguric (but RN reports 30-40/hour last 2 hours) CRRT 200 pre filter/300 post filter/2Liters per hour  dialysate Effluent dose= 39 ml/kg/hour-->reduce dialysate to 1.5 liters/hour Hyperkalemia resolved Metabolic acidosis resolving - off the bicarb drip Watch phosphorus - with drop from 6.9->2.7 will go ahead and replace today CCM would like some vol off - will look at net neg UF 50-75/hour   Jamal Maes, MD [place] pager 08/20/2013, 11:44 AM

## 2013-08-20 NOTE — Progress Notes (Signed)
Name: [name] MRN: [id number] DOB: [birth year]    ADMISSION DATE:  08/16/2013  REFERRING MD :  EDP PRIMARY SERVICE: PCCM  CHIEF COMPLAINT:  Dyspnea  BRIEF PATIENT DESCRIPTION:   1 M with COPD, hx of lung ca, PVD, current smoker presented to [facility] ED with 2 wks of progressive dyspnea and chest tightness minimally and transiently responsive to albuterol MDI. Developed afib rvr, abdominal process,  resp failure, ETT placed and renal failure, sepsis.  LINES / TUBES: 1/28 OTT>> 1/28 l i j cvl>> Rt fem aline 1/29>>> 1/29 rt ij hd>>>  CULTURES: Blood 1/27 >>  resp virus 1/28 >> Neg Flu pcr 1/28 >> NEG 1/29 sputum>> 1/30 uc>>  ANTIBIOTICS: Doxycycline (chronic)>>> Held 1/29 tamiflu 1/28 (ongoing till resp virus multiplex pcr results return) >> 1/29 1/29 vanc>>> 1/29 zosyn>>>  SIGNIFICANT EVENTS / STUDIES:  1/27 CT chest: Negative for PE.  Postop changes right upper lobectomy  Interval increased mediastinal adenopathy. Largest lymph nodes in the left prevascular/ AP window space and the right paratracheal region.  Small chronic residual right subpulmonic effusion with small loculated components and pleural thickening all appearing chronic.  Centrilobular emphysema.   1/28 intubated 1/29 Transferred to [facility]  1/30 cvvhd  SUBJECTIVE/OVERNIGHT/INTERVAL HX- on HD  VITAL SIGNS: Temp:  [96.6 F (35.9 C)-98.3 F (36.8 C)] 97.5 F (36.4 C) (01/31 0800) Pulse Rate:  [73-95] 79 (01/31 0800) Resp:  [0-27] 24 (01/31 0800) BP: (74-130)/(25-97) 122/45 mmHg (01/31 0800) SpO2:  [91 %-100 %] 99 % (01/31 0818) Arterial Line BP: (97-131)/(39-61) 125/56 mmHg (01/31 0800) FiO2 (%):  [40 %] 40 % (01/31 0818) Weight:  [159 lb 6.3 oz (72.3 kg)] 159 lb 6.3 oz (72.3 kg) (01/31 0500) HEMODYNAMICS: CVP:  [12 mmHg-89 mmHg] 14 mmHg VENTILATOR SETTINGS: Vent Mode:  [-] PRVC FiO2 (%):  [40 %] 40 % Set Rate:  [24 bmp] 24 bmp Vt Set:  [500 mL] 500 mL PEEP:  [5 cmH20] 5 cmH20 Plateau Pressure:   [15 cmH20-22 cmH20] 15 cmH20 INTAKE / OUTPUT: Intake/Output     01/30 0701 - 01/31 0700 01/31 0701 - 02/01 0700   I.V. (mL/kg) 2475.9 (34.2) 200.4 (2.8)   NG/GT 60    IV Piggyback 437.5    Total Intake(mL/kg) 2973.4 (41.1) 200.4 (2.8)   Urine (mL/kg/hr) 423 (0.2) 90 (0.4)   Emesis/NG output 30 (0)    Other 3026 (1.7) 145 (0.6)   Total Output 3479 235   Net -505.6 -34.6          PHYSICAL EXAMINATION: General:  Sedated on vent, RASS- -1, HD ongoing,  Neuro:  Sedated.   HEENT:  EOMI, PERRL OTT-Vent, OGT Cardiovascular: Irreg rate and rhythm, tachy, no M Lungs: diffuse coarse breath sounds.  Abdomen: Soft, no tenderness,  +bs Ext: warm, no edema, diminished DP pulses  LABS: PULMONARY  Recent Labs Lab 08/17/13 1535 08/18/13 0515 08/18/13 2105 08/19/13 1120 08/19/13 1317  PHART 7.353 7.206* 7.119* 7.279* 7.370  PCO2ART 34.2* 42.3 42.0 51.2* 39.7  PO2ART 322.0* 101.0* 84.8 78.0* 70.0*  HCO3 18.5* 16.1* 13.2* 24.1* 23.2  TCO2 16.9 14.9 12.7 26 24   O2SAT 99.5 96.0 94.0 94.0 94.0    CBC  Recent Labs Lab 08/18/13 2140 08/19/13 0320 08/20/13 0400  HGB 13.5 12.5* 12.4*  HCT 43.7 38.9* 37.4*  WBC 28.6* 29.1* 25.2*  PLT 120* 113* 90*    COAGULATION  Recent Labs Lab 08/18/13 0025  INR 2.48*    CARDIAC    Recent Labs Lab  08/17/13 0305 08/17/13 1149 08/17/13 2240 08/18/13 0527 08/18/13 2140  TROPONINI <0.30 <0.30 <0.30 <0.30 <0.30    Recent Labs Lab 08/17/13 0305 08/19/13 0320  PROBNP 2840.0* 22750.0*     CHEMISTRY  Recent Labs Lab 08/17/13 0305 08/18/13 0527 08/18/13 2140 08/19/13 0320 08/19/13 1618 08/20/13 0400  NA 136* 136* 132* 135* 133* 137  K 4.2 4.8 6.3* 4.4 4.2 4.6  CL 103 102 100 100 97 99  CO2 17* 15* 17* 18* 23 25  GLUCOSE 211* 240* 298* 209* 153* 91  BUN 17 29* 39* 39* 27* 21  CREATININE 0.70 1.57* 2.53* 2.61* 1.66* 1.27  CALCIUM 7.5* 8.0* 7.0* 7.0* 6.7* 7.1*  MG  --  2.0  --  1.7  --  2.1  PHOS  --  5.7*  --  6.9* 2.7  2.5   Estimated Creatinine Clearance: 51.8 ml/min (by C-G formula based on Cr of 1.27).   LIVER  Recent Labs Lab 08/18/13 0025 08/18/13 2140 08/19/13 0320 08/19/13 1618 08/20/13 0400  AST  --  160* 936*  --  1052*  ALT  --  172* 829*  --  1491*  ALKPHOS  --  65 60  --  62  BILITOT  --  0.6 0.7  --  1.4*  PROT  --  5.0* 4.6*  --  5.2*  ALBUMIN  --  2.7* 2.5* 2.7* 3.0*  INR 2.48*  --   --   --   --      INFECTIOUS  Recent Labs Lab 08/16/13 1953 08/18/13 2140 08/19/13 1130  LATICACIDVEN 2.05 3.1* 2.4*     ENDOCRINE CBG (last 3)   Recent Labs  08/20/13 0012 08/20/13 0347 08/20/13 0715  GLUCAP 90 99 102*    IMAGING x48h  Ct Abdomen Pelvis Wo Contrast  08/18/2013   CLINICAL DATA:  Assess for intra-abdominal infection  EXAM: CT ABDOMEN AND PELVIS WITHOUT CONTRAST  TECHNIQUE: Multidetector CT imaging of the abdomen and pelvis was performed following the standard protocol without intravenous contrast.  COMPARISON:  Prior radiograph from 08/18/2013  FINDINGS: A moderate left pleural effusion with associated compressive atelectasis is present. Small right pleural effusion with associated atelectasis is also noted. No definite focal infiltrate seen within the partially visualized lung bases. Emphysematous changes noted.  Cardiomegaly partially visualized. No pericardial effusion. A few scattered atherosclerotic plaques noted within the partially visualized left anterior descending coronary artery.  Limited noncontrast evaluation liver is grossly unremarkable. Hyperdensity is seen within the nondilated gallbladder, which may represent vicarious excretion of contrast. More hyperdense layering stones are seen within the gallbladder lumen. There is prominent gallbladder wall thickening with small amount of pericholecystic fluid. No definite biliary ductal dilatation identified.  The spleen is within normal limits. Adrenal glands are unremarkable. Limited noncontrast evaluation of the  pancreas is grossly unremarkable.  Enteric tube is coiled within the stomach. There is a small amount of enteric contrast material within the gastric lumen and proximal small bowel. There is question of circumferential bladder wall thickening within the second, third, and fourth portions of the duodenum (series 2, image 42). This finding is somewhat poorly evaluated due to small amount of enteric contrast as well as lack of IV contrast, and may be in part related to incomplete distension. No evidence of bowel obstruction. Scattered colonic diverticula are present without definite evidence of acute diverticulitis. No pneumatosis or portal venous gas identified. No definite inflammatory changes seen about the bowels, although evaluation is limited due to lack of IV  contrast. Appendix is normal.  The bladder is decompressed with a Foley catheter in place small amount of gas lucency within the nondependent portion of the bladder lumen is most compatible with catheterization. Prostate is normal.  Moderate volume ascites is present within the abdomen. No definite loculated fluid collection identified. No free air identified.  Vascular stents noted at the origin of the celiac access in within the proximal SMA. Additional iliac stents noted as well. Surgical clips seen about the common femoral arteries bilaterally. 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 Soft tissue density within the adjacent. Soft tissues is likely postsurgical in nature.  No enlarged intra-abdominal pelvic lymph nodes are identified.  Diffuse anasarca is noted. Small focus of subcutaneous emphysema noted within the subcutaneous fat of the anterior mid abdomen (series 2, image 46), which may be iatrogenic.  Multilevel degenerative disc disease noted within the visualized spine with prominent facet arthrosis at L3 through S1.  IMPRESSION: 1. Question circumferential bowel wall thickening versus  incomplete distention within the duodenum as above, nonspecific, but may represent sequelae of infection or inflammation. Possible ischemic bowel could also be considered in the correct clinical setting. No portal venous gas, free air, or pneumatosis identified. 2. Moderate volume ascites. No definite loculated fluid collection identified. 3. Left greater than right bilateral pleural effusions with associated bibasilar atelectasis. 4. Cholelithiasis with circumferential gallbladder wall thickening and/or pericholecystic fluid. Finding may be related to overall volume status. Clinical correlation and/or right upper quadrant ultrasound could be performed if there is clinical concern for acute cholecystitis. No biliary ductal dilatation identified. 5. Colonic diverticulosis without acute diverticulitis. 6. 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 7. 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 8. Moderate cardiomegaly. 9. Diffuse anasarca.   Electronically Signed   By: Jeannine Boga M.D.   On: 08/18/2013 23:49   Dg Chest Port 1 View  08/20/2013   CLINICAL DATA:  Acute respiratory failure. On ventilator.  EXAM: PORTABLE CHEST - 1 VIEW  COMPARISON:  08/19/2013  FINDINGS: Endotracheal tube tip is approximately 1.5 cm above the carina. Other support lines and tubes remain in appropriate position. No pneumothorax identified.  Increased opacity seen in the left retrocardiac lung base, which may be due to atelectasis or consolidation. Small right pleural effusion also shows mild increase in size. Left pleural effusion cannot be excluded on this portable exam.  IMPRESSION: Increased left retrocardiac atelectasis versus consolidation.  Mild increase in small right pleural effusion. Small left pleural effusion cannot be excluded on this portable exam.  Endotracheal tube tip approximately 1.5 cm above carina.    Electronically Signed   By: Earle Gell M.D.   On: 08/20/2013 07:56   Dg Chest Port 1 View  08/19/2013   CLINICAL DATA:  Line placement  EXAM: PORTABLE CHEST - 1 VIEW  COMPARISON:  08/18/2013  FINDINGS: New right IJ temporary dialysis time catheter noted, tip mid SVC. Endotracheal to 3 cm above the carina. NG tube extends into the stomach. Cardiomegaly evident with vascular congestion and dense left lower lobe collapse/consolidation. Postop changes in the right lung with associated volume loss. No enlarging effusion or pneumothorax.  IMPRESSION: New right IJ temporary dialysis catheter, tip mid SVC level  No pneumothorax.  No other significant change   Electronically Signed   By: Daryll Brod M.D.   On: 08/19/2013 01:20  Dg Abd Portable 1v  08/18/2013   CLINICAL DATA:  Abdominal distension  EXAM: PORTABLE ABDOMEN - 1 VIEW  COMPARISON:  None.  FINDINGS: Nonobstructive bowel gas pattern. Unremarkable colonic stool burden. Nondiagnostic evaluation for pneumoperitoneum secondary to supine positioning and exclusion of the lower thorax. No definite pneumatosis or portal venous gas. Enteric tube tip and side port overlie the expected location of the gastric fundus.  Vascular stents overlie the expected location of the left common iliac artery, SMA and possibly the celiac artery. Multiple surgical clips overlie the bilateral groins. A for Mr. overlies expected location of the urinary bladder.  Mild multilevel DDD is suspected within the lumbar spine, incompletely evaluated. No acute osseus abnormalities.  IMPRESSION: Nonobstructive bowel gas pattern. Unremarkable colonic stool burden.   Electronically Signed   By: Sandi Mariscal M.D.   On: 08/18/2013 19:08    ASSESSMENT / PLAN:  PULMONARY A:  Acute on chronic respiratory failure AECOPD Hx of lung cancer s/p resection 2009 Increased mediatinal LAN, nonspecific Edema increased 1/31  P:   - For neg balance on cvvhd - Vent bundle - Hold weaning for  now.  CARDIOVASCULAR A:  Nonspecific chest pain PRWP on EKG New onset AFRVR Peripheral vascular disease P:  - Diltiazem off in shock. - Amio off, restart if rvr noted. - Neo to map goals, max at 200, vaso. - Stress dose steroids. - Tsh (1.998). - CVP 14.  Intake/Output Summary (Last 24 hours) at 08/20/13 1019 Last data filed at 08/20/13 1000  Gross per 24 hour  Intake 2484.14 ml  Output   3005 ml  Net -520.86 ml   RENAL Lab Results  Component Value Date   CREATININE 1.27 08/20/2013   CREATININE 1.66* 08/19/2013   CREATININE 2.61* 08/19/2013   Recent Labs Lab 08/19/13 0320 08/19/13 1618 08/20/13 0400  K 4.4 4.2 4.6   A: New renal failure, no hydro on CT, ATN likely, AG acidosis ( uremia, lactic acid) P:   cvvhd Renal following , cvp 14 Bladder pressure 20, however, does not match abdo comp syndrome clinically  GASTROINTESTINAL A:  Chronic PPI use, shock liver likely, unlikley abdo ischemia P:   SUP: Cont home dose of ppi Acute hep panel Repeat lft   hida if bili worsens, changes on ct are from anasarca ast 936->1052 Bilirubin 0.7->1.4 Alt 829->1491  HEMATOLOGIC A:  Thrombocytopenia (sepsis), unknown chronicity P: hep iv for fib Plat repeat  113->90  INFECTIOUS A:   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  Richardson Landry Minor ACNP Maryanna Shape PCCM Pager [phone] till 3 pm If no answer page [phone] 08/20/2013, 10:24 AM  Continue full vent support, needs significant volume removal prior to any consideration of  weaning.  Continue CVVH and will monitor condition for now.  CC time 35 min.  Patient seen and examined, agree with above note.  I dictated the care and orders written for this patient under my direction.  Rush Farmer, MD  [phone] 

## 2013-08-21 LAB — GLUCOSE, CAPILLARY
GLUCOSE-CAPILLARY: 104 mg/dL — AB (ref 70–99)
GLUCOSE-CAPILLARY: 111 mg/dL — AB (ref 70–99)
GLUCOSE-CAPILLARY: 116 mg/dL — AB (ref 70–99)
GLUCOSE-CAPILLARY: 117 mg/dL — AB (ref 70–99)
GLUCOSE-CAPILLARY: 156 mg/dL — AB (ref 70–99)
Glucose-Capillary: 111 mg/dL — ABNORMAL HIGH (ref 70–99)
Glucose-Capillary: 129 mg/dL — ABNORMAL HIGH (ref 70–99)

## 2013-08-21 LAB — HEPATIC FUNCTION PANEL
ALBUMIN: 2.7 g/dL — AB (ref 3.5–5.2)
ALT: 937 U/L — ABNORMAL HIGH (ref 0–53)
AST: 223 U/L — ABNORMAL HIGH (ref 0–37)
Alkaline Phosphatase: 56 U/L (ref 39–117)
BILIRUBIN TOTAL: 1.3 mg/dL — AB (ref 0.3–1.2)
Bilirubin, Direct: 0.6 mg/dL — ABNORMAL HIGH (ref 0.0–0.3)
Indirect Bilirubin: 0.7 mg/dL (ref 0.3–0.9)
Total Protein: 5.1 g/dL — ABNORMAL LOW (ref 6.0–8.3)

## 2013-08-21 LAB — RENAL FUNCTION PANEL
ALBUMIN: 2.5 g/dL — AB (ref 3.5–5.2)
Albumin: 2.8 g/dL — ABNORMAL LOW (ref 3.5–5.2)
BUN: 20 mg/dL (ref 6–23)
BUN: 27 mg/dL — ABNORMAL HIGH (ref 6–23)
CHLORIDE: 100 meq/L (ref 96–112)
CO2: 23 mEq/L (ref 19–32)
CO2: 22 mEq/L (ref 19–32)
CREATININE: 1 mg/dL (ref 0.50–1.35)
CREATININE: 1.35 mg/dL (ref 0.50–1.35)
Calcium: 7.4 mg/dL — ABNORMAL LOW (ref 8.4–10.5)
Calcium: 7.2 mg/dL — ABNORMAL LOW (ref 8.4–10.5)
Chloride: 102 mEq/L (ref 96–112)
GFR calc Af Amer: 84 mL/min — ABNORMAL LOW (ref >90)
GFR calc Af Amer: 59 mL/min — ABNORMAL LOW (ref >90)
GFR calc non Af Amer: 73 mL/min — ABNORMAL LOW (ref >90)
GFR calc non Af Amer: 50 mL/min — ABNORMAL LOW (ref >90)
GLUCOSE: 127 mg/dL — AB (ref 70–99)
Glucose, Bld: 121 mg/dL — ABNORMAL HIGH (ref 70–99)
POTASSIUM: 4.5 meq/L (ref 3.7–5.3)
Phosphorus: 2.3 mg/dL (ref 2.3–4.6)
Phosphorus: 2.3 mg/dL (ref 2.3–4.6)
Potassium: 4.4 mEq/L (ref 3.7–5.3)
Sodium: 137 mEq/L (ref 137–147)
Sodium: 137 mEq/L (ref 137–147)

## 2013-08-21 LAB — CBC WITH DIFFERENTIAL/PLATELET
BASOS ABS: 0 10*3/uL (ref 0.0–0.1)
Basophils Relative: 0 % (ref 0–1)
EOS ABS: 0 10*3/uL (ref 0.0–0.7)
Eosinophils Relative: 0 % (ref 0–5)
HEMATOCRIT: 35.9 % — AB (ref 39.0–52.0)
Hemoglobin: 12.1 g/dL — ABNORMAL LOW (ref 13.0–17.0)
LYMPHS PCT: 38 % (ref 12–46)
Lymphs Abs: 7.1 10*3/uL — ABNORMAL HIGH (ref 0.7–4.0)
MCH: 30.8 pg (ref 26.0–34.0)
MCHC: 33.7 g/dL (ref 30.0–36.0)
MCV: 91.3 fL (ref 78.0–100.0)
MONOS PCT: 3 % (ref 3–12)
Monocytes Absolute: 0.6 10*3/uL (ref 0.1–1.0)
NEUTROS PCT: 59 % (ref 43–77)
Neutro Abs: 10.9 10*3/uL — ABNORMAL HIGH (ref 1.7–7.7)
PLATELETS: 69 10*3/uL — AB (ref 150–400)
RBC: 3.93 MIL/uL — ABNORMAL LOW (ref 4.22–5.81)
RDW: 15.3 % (ref 11.5–15.5)
WBC: 18.6 10*3/uL — AB (ref 4.0–10.5)

## 2013-08-21 LAB — RESPIRATORY VIRUS PANEL
Adenovirus: NOT DETECTED
INFLUENZA B 1: NOT DETECTED
Influenza A H1: NOT DETECTED
Influenza A H3: NOT DETECTED
Influenza A: NOT DETECTED
METAPNEUMOVIRUS: NOT DETECTED
PARAINFLUENZA 1 A: NOT DETECTED
PARAINFLUENZA 2 A: NOT DETECTED
PARAINFLUENZA 3 A: NOT DETECTED
Respiratory Syncytial Virus A: NOT DETECTED
Respiratory Syncytial Virus B: NOT DETECTED
Rhinovirus: NOT DETECTED

## 2013-08-21 LAB — MAGNESIUM: MAGNESIUM: 2.6 mg/dL — AB (ref 1.5–2.5)

## 2013-08-21 LAB — HEPARIN LEVEL (UNFRACTIONATED)
HEPARIN UNFRACTIONATED: 0.2 [IU]/mL — AB (ref 0.30–0.70)
HEPARIN UNFRACTIONATED: 0.29 [IU]/mL — AB (ref 0.30–0.70)
HEPARIN UNFRACTIONATED: 0.17 [IU]/mL — AB (ref 0.30–0.70)

## 2013-08-21 LAB — CULTURE, RESPIRATORY W GRAM STAIN

## 2013-08-21 LAB — CULTURE, RESPIRATORY

## 2013-08-21 LAB — PHOSPHORUS: Phosphorus: 2.3 mg/dL (ref 2.3–4.6)

## 2013-08-21 MED ORDER — HEPARIN SODIUM (PORCINE) 1000 UNIT/ML DIALYSIS
1000.0000 [IU] | INTRAMUSCULAR | Status: DC | PRN
  Administered 2013-08-21: 3000 [IU] via INTRAVENOUS_CENTRAL
  Filled 2013-08-21 (×2): qty 6

## 2013-08-21 MED ORDER — HEPARIN (PORCINE) IN NACL 100-0.45 UNIT/ML-% IJ SOLN
950.0000 [IU]/h | INTRAMUSCULAR | Status: DC
  Administered 2013-08-21: 950 [IU]/h via INTRAVENOUS
  Filled 2013-08-21: qty 250

## 2013-08-21 MED ORDER — PIPERACILLIN-TAZOBACTAM IN DEX 2-0.25 GM/50ML IV SOLN
2.2500 g | Freq: Three times a day (TID) | INTRAVENOUS | Status: DC
  Administered 2013-08-21 – 2013-08-22 (×2): 2.25 g via INTRAVENOUS
  Filled 2013-08-21 (×4): qty 50

## 2013-08-21 NOTE — Progress Notes (Addendum)
Subjective:   On vent but responds to commands; nods "no" to chest pain or abd pain CRRT at -50-75/hour all 4K solutions Off pressors now Not much UOP (10-20/hour) Remains in AF  Objective Vital signs in last 24 hours: Filed Vitals:   08/21/13 0400 08/21/13 0500 08/21/13 0600 08/21/13 0700  BP: 98/62 98/61 88/56  104/67  Pulse: 117 120 106 108  Temp: 97.7 F (36.5 C) 97.7 F (36.5 C) 97.3 F (36.3 C) 97.2 F (36.2 C)  TempSrc: Core (Comment) Core (Comment) Core (Comment)   Resp: 24 24 24 25   Height:      Weight:  71.7 kg (158 lb 1.1 oz)    SpO2: 100% 100% 100% 100%   Weight change: -0.6 kg (-1 lb 5.2 oz)  Intake/Output Summary (Last 24 hours) at 08/21/13 0740 Last data filed at 08/21/13 0700  Gross per 24 hour  Intake 1729.46 ml  Output   2893 ml  Net -1163.54 ml    Physical Exam:  Blood pressure 122/45, pulse 79, temperature 97.5 F (36.4 C), temperature source Core (Comment), resp. rate 24, height 5\' 9"  (1.753 m), weight 72.3 kg (159 lb 6.3 oz), SpO2 99.00%.  General:. CRRT via right ij temp cath (1/30)  Awakens to voice and follows commands Eyes: Sclera clear, no icterus.  Mouth: orotracheal tube and OGT  Lungs: Coarse breath sounds Heart: Irregular rate and rhythm; no murmurs, clicks, rubs, or gallops.  Abdomen: Firm but not tender. Hypoactive bowel sounds  Extremities: Trace pretibial edema only  Labs: Basic Metabolic Panel:  Recent Labs Lab 08/18/13 0527 08/18/13 2140 08/19/13 0320 08/19/13 1618 08/20/13 0400 08/20/13 1702 08/21/13 0425 08/21/13 0500  NA 136* 132* 135* 133* 137 135*  --  137  K 4.8 6.3* 4.4 4.2 4.6 4.2  --  4.4  CL 102 100 100 97 99 98  --  100  CO2 15* 17* 18* 23 25 23   --  23  GLUCOSE 240* 298* 209* 153* 91 127*  --  121*  BUN 29* 39* 39* 27* 21 22  --  20  CREATININE 1.57* 2.53* 2.61* 1.66* 1.27 1.13  --  1.00  CALCIUM 8.0* 7.0* 7.0* 6.7* 7.1* 7.3*  --  7.4*  PHOS 5.7*  --  6.9* 2.7 2.5 3.0 2.3 2.3     Recent Labs Lab  08/18/13 2140 08/19/13 0320  08/20/13 0400 08/20/13 1702 08/21/13 0500  AST 160* 936*  --  1052*  --   --   ALT 172* 829*  --  1491*  --   --   ALKPHOS 65 60  --  62  --   --   BILITOT 0.6 0.7  --  1.4*  --   --   PROT 5.0* 4.6*  --  5.2*  --   --   ALBUMIN 2.7* 2.5*  < > 3.0* 2.8* 2.8*  < > = values in this interval not displayed.  Recent Labs Lab 08/19/13 0320  LIPASE 46    Recent Labs Lab 08/18/13 0527 08/18/13 2140 08/19/13 0320 08/20/13 0400 08/21/13 0425  WBC 21.1* 28.6* 29.1* 25.2* 18.6*  NEUTROABS 9.7*  --  16.9* 15.3* 10.9*  HGB 13.5 13.5 12.5* 12.4* 12.1*  HCT 42.4 43.7 38.9* 37.4* 35.9*  MCV 96.1 97.8 95.6 92.1 91.3  PLT 136* 120* 113* 90* 69*    Recent Labs Lab 08/17/13 0305 08/17/13 1149 08/17/13 2240 08/18/13 0527 08/18/13 2140  TROPONINI <0.30 <0.30 <0.30 <0.30 <0.30    Recent Labs Lab  08/20/13 1150 08/20/13 1627 08/20/13 1955 08/20/13 2356 08/21/13 0353  GLUCAP 107* 115* 120* 117* 111*   Lab Results  Component Value Date   INR 2.48* 08/18/2013   INR 1.09 03/27/2010   INR 1.4 03/29/2008   Studies/Results: Dg Chest Port 1 View  08/20/2013   CLINICAL DATA:  Acute respiratory failure. On ventilator.  EXAM: PORTABLE CHEST - 1 VIEW  COMPARISON:  08/19/2013  FINDINGS: Endotracheal tube tip is approximately 1.5 cm above the carina. Other support lines and tubes remain in appropriate position. No pneumothorax identified.  Increased opacity seen in the left retrocardiac lung base, which may be due to atelectasis or consolidation. Small right pleural effusion also shows mild increase in size. Left pleural effusion cannot be excluded on this portable exam.  IMPRESSION: Increased left retrocardiac atelectasis versus consolidation.  Mild increase in small right pleural effusion. Small left pleural effusion cannot be excluded on this portable exam.  Endotracheal tube tip approximately 1.5 cm above carina.   Electronically Signed   By: Earle Gell M.D.   On:  08/20/2013 07:56   Medications: . sodium chloride 20 mL/hr at 08/20/13 2104  . fentaNYL infusion INTRAVENOUS 120 mcg/hr (08/20/13 2059)  . heparin 900 Units/hr (08/21/13 1696)  . phenylephrine (NEO-SYNEPHRINE) Adult infusion 50 mcg/min (08/20/13 7893)  . dialysis replacement fluid (prismasate) 200 mL/hr at 08/20/13 1650  . dialysis replacement fluid (prismasate) 300 mL/hr at 08/20/13 1650  . dialysate (PRISMASATE) 1,500 mL/hr at 08/21/13 0618  . vasopressin (PITRESSIN) infusion - *FOR SHOCK* 0.03 Units/min (08/19/13 2205)   . antiseptic oral rinse  15 mL Mouth Rinse q12n4p  . aspirin  81 mg Oral Daily  . budesonide  0.25 mg Nebulization Q6H  . chlorhexidine  15 mL Mouth Rinse BID  . clopidogrel  75 mg Oral Q breakfast  . hydrocortisone sod succinate (SOLU-CORTEF) inj  50 mg Intravenous Q6H  . insulin aspart  0-9 Units Subcutaneous Q4H  . ipratropium  0.5 mg Nebulization Q6H  . levalbuterol  0.63 mg Nebulization Q6H  . pantoprazole (PROTONIX) IV  40 mg Intravenous Q24H  . piperacillin-tazobactam  3.375 g Intravenous Q6H  . vancomycin  1,000 mg Intravenous Q24H   . sodium chloride 20 mL/hr at 08/20/13 2104  . fentaNYL infusion INTRAVENOUS 120 mcg/hr (08/20/13 2059)  . heparin 900 Units/hr (08/21/13 8101)  . phenylephrine (NEO-SYNEPHRINE) Adult infusion 50 mcg/min (08/20/13 7510)  . dialysis replacement fluid (prismasate) 200 mL/hr at 08/20/13 1650  . dialysis replacement fluid (prismasate) 300 mL/hr at 08/20/13 1650  . dialysate (PRISMASATE) 1,500 mL/hr at 08/21/13 0618  . vasopressin (PITRESSIN) infusion - *FOR SHOCK* 0.03 Units/min (08/19/13 2205)    I  have reviewed scheduled and prn medications.  Assessment/Plan 73 yo with acute on chronic respiratory failure, COPD, h/o lung CA resection, systolic heart failure (EF 25%), AF with RVR;  Shock liver; subsequent development of oliguric AKI, metabolic acidosis, hyperkalemia in the setting of respiratory failure, pressor dependent  hypotension, IV contrast (1/27). On NSAIDs PTA  (Baseline creatinine 0.7).  CRRT initiated 08/19/13.  Acute renal failure - oliguric  CRRT 200 pre filter/300 post filter/1.5 Liters per hour dialysate Effluent dose= 27 ml/kg/hour Hyperkalemia resolved Metabolic acidosis resolved Off pressors  Recommendations: Stop CRRT Transition to intermittent HD  Reassess tomorrow for need Phos will drift back up off CRRT (repleted yesterday)   Jamal Maes, MD [place] pager 08/21/2013, 7:40 AM

## 2013-08-21 NOTE — Progress Notes (Signed)
ANTICOAGULATION CONSULT NOTE - Follow Up Consult  Pharmacy Consult for heparin Indication: atrial fibrillation  Labs:  Recent Labs  08/18/13 2140 08/19/13 0320  08/19/13 1618 08/20/13 0400 08/20/13 1702 08/21/13 0425 08/21/13 0500  HGB 13.5 12.5*  --   --  12.4*  --  12.1*  --   HCT 43.7 38.9*  --   --  37.4*  --  35.9*  --   PLT 120* 113*  --   --  90*  --  69*  --   HEPARINUNFRC  --   --   < > 0.56 0.64  --  0.20*  --   CREATININE 2.53* 2.61*  --  1.66* 1.27 1.13  --  1.00  TROPONINI <0.30  --   --   --   --   --   --   --   < > = values in this interval not displayed.   Assessment: 73yo male now subtherapeutic on heparin after rate decrease.  Goal of Therapy:  Heparin level 0.3-0.7 units/ml   Plan:  Will increase heparin gtt back to previously therapeutic rate of 900 units/hr and check level in [place], PharmD, BCPS  08/21/2013,6:23 AM

## 2013-08-21 NOTE — Progress Notes (Signed)
Patient Name: [name] Date of Encounter: 08/21/2013  Principal Problem:   Acute on chronic respiratory failure Active Problems:   Acute exacerbation of chronic obstructive pulmonary disease (COPD)   History of lung cancer, s/p RUL resection 2009 @ [facility]   Mediastinal adenopathy   Smoker   Atrial fibrillation with RVR   PRWP on ECG   New onset atrial fibrillation   Chest tightness   Peripheral vascular disease   Vascular graft infection, on chronic doxycycline   Thrombocytopenia   Acute respiratory failure   Acute combined systolic and diastolic heart failure   Septic shock(785.52)   Length of Stay: 5  SUBJECTIVE  Intubated, sedated, calm. AF with mild RVR Off pressors, mildly hypotensive CRRT stopped with plans for intermittent hemodialysis Creat is 1.0 (artifactually low post CRRT) Platelets have dropped substantially, but gradually, going up and down.   CURRENT MEDS . antiseptic oral rinse  15 mL Mouth Rinse q12n4p  . aspirin  81 mg Oral Daily  . budesonide  0.25 mg Nebulization Q6H  . chlorhexidine  15 mL Mouth Rinse BID  . clopidogrel  75 mg Oral Q breakfast  . hydrocortisone sod succinate (SOLU-CORTEF) inj  50 mg Intravenous Q6H  . insulin aspart  0-9 Units Subcutaneous Q4H  . ipratropium  0.5 mg Nebulization Q6H  . levalbuterol  0.63 mg Nebulization Q6H  . pantoprazole (PROTONIX) IV  40 mg Intravenous Q24H  . piperacillin-tazobactam  3.375 g Intravenous Q6H  . vancomycin  1,000 mg Intravenous Q24H    OBJECTIVE  Filed Vitals:   08/21/13 0700 08/21/13 0759 08/21/13 0800 08/21/13 1139  BP: 104/67  118/61 95/68  Pulse: 108  101 108  Temp: 97.2 F (36.2 C)  97.3 F (36.3 C) 98 F (36.7 C)  TempSrc:      Resp: 25  24 24   Height:      Weight:      SpO2: 100% 100% 100% 100%    Intake/Output Summary (Last 24 hours) at 08/21/13 1341 Last data filed at 08/21/13 1000  Gross per 24 hour  Intake 1327.36 ml  Output   2545 ml  Net -1217.64 ml    Filed Weights   08/19/13 0500 08/20/13 0500 08/21/13 0500  Weight: 71.2 kg (156 lb 15.5 oz) 72.3 kg (159 lb 6.3 oz) 71.7 kg (158 lb 1.1 oz)    PHYSICAL EXAM  General: Pleasant, NAD. Neuro: Alert and oriented X 3. Moves all extremities spontaneously. Psych: Normal affect. HEENT:  Normal  Neck: Supple without bruits or JVD. Lungs:  Resp regular and unlabored, CTA. Heart: RRR no s3, s4, or murmurs. Abdomen: Soft, non-tender, non-distended, BS + x 4.  Extremities: No clubbing, cyanosis or edema. DP/PT/Radials 2+ and equal bilaterally.  Accessory Clinical Findings  CBC  Recent Labs  08/20/13 0400 08/21/13 0425  WBC 25.2* 18.6*  NEUTROABS 15.3* 10.9*  HGB 12.4* 12.1*  HCT 37.4* 35.9*  MCV 92.1 91.3  PLT 90* 69*   Basic Metabolic Panel  Recent Labs  08/20/13 0400 08/20/13 1702 08/21/13 0425 08/21/13 0500  NA 137 135*  --  137  K 4.6 4.2  --  4.4  CL 99 98  --  100  CO2 25 23  --  23  GLUCOSE 91 127*  --  121*  BUN 21 22  --  20  CREATININE 1.27 1.13  --  1.00  CALCIUM 7.1* 7.3*  --  7.4*  MG 2.1  --  2.6*  --   PHOS  2.5 3.0 2.3 2.3   Liver Function Tests  Recent Labs  08/20/13 0400  08/21/13 0500 08/21/13 1105  AST 1052*  --   --  223*  ALT 1491*  --   --  937*  ALKPHOS 62  --   --  56  BILITOT 1.4*  --   --  1.3*  PROT 5.2*  --   --  5.1*  ALBUMIN 3.0*  < > 2.8* 2.7*  < > = values in this interval not displayed.  Recent Labs  08/19/13 0320  LIPASE 46   Cardiac Enzymes  Recent Labs  08/18/13 2140  TROPONINI <0.30   BNP No components found with this basename: POCBNP,  D-Dimer No results found for this basename: DDIMER,  in the last 72 hours Hemoglobin A1C No results found for this basename: HGBA1C,  in the last 72 hours Fasting Lipid Panel No results found for this basename: CHOL, HDL, LDLCALC, TRIG, CHOLHDL, LDLDIRECT,  in the last 72 hours Thyroid Function Tests  Recent Labs  08/19/13 1113  TSH 1.998     Radiology/Studies  Ct Abdomen Pelvis Wo Contrast  08/18/2013   CLINICAL DATA:  Assess for intra-abdominal infection  EXAM: CT ABDOMEN AND PELVIS WITHOUT CONTRAST  TECHNIQUE: Multidetector CT imaging of the abdomen and pelvis was performed following the standard protocol without intravenous contrast.  COMPARISON:  Prior radiograph from 08/18/2013  FINDINGS: A moderate left pleural effusion with associated compressive atelectasis is present. Small right pleural effusion with associated atelectasis is also noted. No definite focal infiltrate seen within the partially visualized lung bases. Emphysematous changes noted.  Cardiomegaly partially visualized. No pericardial effusion. A few scattered atherosclerotic plaques noted within the partially visualized left anterior descending coronary artery.  Limited noncontrast evaluation liver is grossly unremarkable. Hyperdensity is seen within the nondilated gallbladder, which may represent vicarious excretion of contrast. More hyperdense layering stones are seen within the gallbladder lumen. There is prominent gallbladder wall thickening with small amount of pericholecystic fluid. No definite biliary ductal dilatation identified.  The spleen is within normal limits. Adrenal glands are unremarkable. Limited noncontrast evaluation of the pancreas is grossly unremarkable.  Enteric tube is coiled within the stomach. There is a small amount of enteric contrast material within the gastric lumen and proximal small bowel. There is question of circumferential bladder wall thickening within the second, third, and fourth portions of the duodenum (series 2, image 42). This finding is somewhat poorly evaluated due to small amount of enteric contrast as well as lack of IV contrast, and may be in part related to incomplete distension. No evidence of bowel obstruction. Scattered colonic diverticula are present without definite evidence of acute diverticulitis. No pneumatosis or  portal venous gas identified. No definite inflammatory changes seen about the bowels, although evaluation is limited due to lack of IV contrast. Appendix is normal.  The bladder is decompressed with a Foley catheter in place small amount of gas lucency within the nondependent portion of the bladder lumen is most compatible with catheterization. Prostate is normal.  Moderate volume ascites is present within the abdomen. No definite loculated fluid collection identified. No free air identified.  Vascular stents noted at the origin of the celiac access in within the proximal SMA. Additional iliac stents noted as well. Surgical clips seen about the common femoral arteries bilaterally. 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 Soft tissue density within the adjacent. Soft tissues is likely postsurgical  in nature.  No enlarged intra-abdominal pelvic lymph nodes are identified.  Diffuse anasarca is noted. Small focus of subcutaneous emphysema noted within the subcutaneous fat of the anterior mid abdomen (series 2, image 46), which may be iatrogenic.  Multilevel degenerative disc disease noted within the visualized spine with prominent facet arthrosis at L3 through S1.  IMPRESSION: 1. Question circumferential bowel wall thickening versus incomplete distention within the duodenum as above, nonspecific, but may represent sequelae of infection or inflammation. Possible ischemic bowel could also be considered in the correct clinical setting. No portal venous gas, free air, or pneumatosis identified. 2. Moderate volume ascites. No definite loculated fluid collection identified. 3. Left greater than right bilateral pleural effusions with associated bibasilar atelectasis. 4. Cholelithiasis with circumferential gallbladder wall thickening and/or pericholecystic fluid. Finding may be related to overall volume status. Clinical correlation and/or right upper quadrant ultrasound  could be performed if there is clinical concern for acute cholecystitis. No biliary ductal dilatation identified. 5. Colonic diverticulosis without acute diverticulitis. 6. 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 7. 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 8. Moderate cardiomegaly. 9. Diffuse anasarca.   Electronically Signed   By: Rise Mu M.D.   On: 08/18/2013 23:49   Dg Chest 2 View  08/16/2013   CLINICAL DATA:  Chest pain.  Cough.  Short of breath.  EXAM: CHEST  2 VIEW  COMPARISON:  03/30/2008  FINDINGS: Moderate cardiomegaly. Stable right pleural effusion. No pneumothorax. The previously described loculated hydro pneumothorax is no longer visualized. Pulmonary opacities at the right pacer worrisome for airspace disease.  IMPRESSION: Right pleural effusion and basilar pulmonary opacities.   Electronically Signed   By: Maryclare Bean M.D.   On: 08/16/2013 17:07   Ct Angio Chest Pe W/cm &/or Wo Cm  08/16/2013   CLINICAL DATA:  Shortness of breath, elevated D-dimer, chest pain, prior lung cancer, status post right upper lobectomy  EXAM: CT ANGIOGRAPHY CHEST WITH CONTRAST  TECHNIQUE: Multidetector CT imaging of the chest was performed using the standard protocol during bolus administration of intravenous contrast. Multiplanar CT image reconstructions including MIPs were obtained to evaluate the vascular anatomy.  CONTRAST:  OMNIPAQUE IOHEXOL 350 MG/ML SOLN  COMPARISON:  03/26/2008, 03/27/2008  FINDINGS: Pulmonary arteries appear patent. No significant filling defect or pulmonary embolus demonstrated by CTA. Atherosclerotic changes of the aorta. Thoracic aorta is not opacified because of the phase of imaging. Coronal calcifications noted. Normal heart size. No pericardial effusion. Chronic residual small right base effusion with small subpulmonic loculated components. Increased  mediastinal adenopathy diffusely. Prevascular and AP window lymph nodes are enlarged, index measurement 28 x 17 mm, image 36. Right paratracheal adenopathy measures 18 x 27 mm. Postop changes from right upper lobectomy.  Included upper abdomen demonstrates incidental small gallstones. Celiac and SMA vascular stents noted. Degenerative changes of the spine. No acute osseous finding.  Lung windows demonstrate postop changes from right upper lobectomy. Background centrilobular emphysema throughout both lungs. Scattered areas of parenchymal scarring along the right fissure noted. Right base bandlike density is also compatible with scarring from the chronic residual small right effusion. Lung windows are limited with motion artifact. Scattered areas of pleural thickening on the right from prior surgery and chest tube. Minor left base atelectasis as well. No definite superimposed airspace process or pneumonia.  Review of the MIP images confirms the above findings.  IMPRESSION: Negative for significant acute pulmonary  embolus.  Postop changes right upper lobectomy  Interval increased mediastinal adenopathy, index measurements as above. Largest lymph nodes in the left prevascular/ AP window space and the right paratracheal region.  Small chronic residual right subpulmonic effusion with small loculated components and pleural thickening all appearing chronic.  Centrilobular emphysema  No superimposed definite pneumonia or edema   Electronically Signed   By: Daryll Brod M.D.   On: 08/16/2013 18:20   Dg Chest Port 1 View  08/20/2013   CLINICAL DATA:  Acute respiratory failure. On ventilator.  EXAM: PORTABLE CHEST - 1 VIEW  COMPARISON:  08/19/2013  FINDINGS: Endotracheal tube tip is approximately 1.5 cm above the carina. Other support lines and tubes remain in appropriate position. No pneumothorax identified.  Increased opacity seen in the left retrocardiac lung base, which may be due to atelectasis or consolidation. Small  right pleural effusion also shows mild increase in size. Left pleural effusion cannot be excluded on this portable exam.  IMPRESSION: Increased left retrocardiac atelectasis versus consolidation.  Mild increase in small right pleural effusion. Small left pleural effusion cannot be excluded on this portable exam.  Endotracheal tube tip approximately 1.5 cm above carina.   Electronically Signed   By: Earle Gell M.D.   On: 08/20/2013 07:56   Dg Chest Port 1 View  08/19/2013   CLINICAL DATA:  Line placement  EXAM: PORTABLE CHEST - 1 VIEW  COMPARISON:  08/18/2013  FINDINGS: New right IJ temporary dialysis time catheter noted, tip mid SVC. Endotracheal to 3 cm above the carina. NG tube extends into the stomach. Cardiomegaly evident with vascular congestion and dense left lower lobe collapse/consolidation. Postop changes in the right lung with associated volume loss. No enlarging effusion or pneumothorax.  IMPRESSION: New right IJ temporary dialysis catheter, tip mid SVC level  No pneumothorax.  No other significant change   Electronically Signed   By: Daryll Brod M.D.   On: 08/19/2013 01:20   Dg Chest Port 1 View  08/18/2013   CLINICAL DATA:  Acute on chronic respiratory failure. Endotracheal tube position.  EXAM: PORTABLE CHEST - 1 VIEW  COMPARISON:  08/17/2013  FINDINGS: Endotracheal tube tip is 16 mm above the carina and directed toward the right. Central venous catheter and NG tube appear in good position.  There is a new area of infiltrate or atelectasis at the left lung base. There is slight pulmonary vascular congestion which is new. Chronic changes throughout the right lung are stable.  IMPRESSION: 1. Endotracheal tube is 16 mm above the carina and directed toward the right. 2. New atelectasis/infiltrate at the left lung base. 3. New slight pulmonary vascular congestion.   Electronically Signed   By: Rozetta Nunnery M.D.   On: 08/18/2013 07:36   Dg Chest Port 1 View  08/17/2013   CLINICAL DATA:  Central  line and endotracheal tube placement. Acute on chronic respiratory failure. COPD.  EXAM: PORTABLE CHEST - 1 VIEW 3:09 p.m.  COMPARISON:  08/17/2013 at 5:13 a.m.  FINDINGS: Endotracheal tube tip is 2.4 cm above the carina. Left jugular vein catheter tip is in the superior vena cava.  There is chronic elevation of the right hemidiaphragm with chronic blunting of the right costophrenic angle. There is pulmonary vascular congestion. No effusions.  IMPRESSION: Endotracheal tube and central line in good position. New pulmonary vascular congestion.   Electronically Signed   By: Rozetta Nunnery M.D.   On: 08/17/2013 15:28   Dg Chest Port 1 View  08/17/2013  CLINICAL DATA:  Respiratory failure  EXAM: PORTABLE CHEST - 1 VIEW  COMPARISON:  08/16/2013  FINDINGS: There are prominent bronchovascular markings with coarse reticular and patchy airspace opacity evident at the right lung base, where there is also a minimal effusion. These findings are stable. No pneumothorax.  Cardiac silhouette is mildly enlarged. The mediastinum is normal in contour.  IMPRESSION: No change from the previous day's study.   Electronically Signed   By: Lajean Manes M.D.   On: 08/17/2013 08:04   Dg Abd Portable 1v  08/18/2013   CLINICAL DATA:  Abdominal distension  EXAM: PORTABLE ABDOMEN - 1 VIEW  COMPARISON:  None.  FINDINGS: Nonobstructive bowel gas pattern. Unremarkable colonic stool burden. Nondiagnostic evaluation for pneumoperitoneum secondary to supine positioning and exclusion of the lower thorax. No definite pneumatosis or portal venous gas. Enteric tube tip and side port overlie the expected location of the gastric fundus.  Vascular stents overlie the expected location of the left common iliac artery, SMA and possibly the celiac artery. Multiple surgical clips overlie the bilateral groins. A for Mr. overlies expected location of the urinary bladder.  Mild multilevel DDD is suspected within the lumbar spine, incompletely evaluated. No  acute osseus abnormalities.  IMPRESSION: Nonobstructive bowel gas pattern. Unremarkable colonic stool burden.   Electronically Signed   By: Sandi Mariscal M.D.   On: 08/18/2013 19:08    TELE AFib, mild RVR    ASSESSMENT AND PLAN  Ventricular rate is only mildly elevated and BP precludes conventional AV blocking agents, while fluctuating renal function makes digoxin undesirable and recovery from shock liver akes amiodarone use risky. I think best left alone for now.  While concern re: dropping platelet count and heparin is appropriate, it is not following the typical HIT course. If no invasive procedures are planned, start warfarin so we can discontinue heparin.   Sanda Klein, MD, [facility] [phone] office [phone] pager 08/21/2013 1:41 PM

## 2013-08-21 NOTE — Progress Notes (Addendum)
Name: [name] MRN: [id number] DOB: 05/09/1941    ADMISSION DATE:  08/16/2013  REFERRING MD :  EDP PRIMARY SERVICE: PCCM  CHIEF COMPLAINT:  Dyspnea  BRIEF PATIENT DESCRIPTION:   89 M with COPD, hx of lung ca, PVD, current smoker presented to [facility] ED with 2 wks of progressive dyspnea and chest tightness minimally and transiently responsive to albuterol MDI. Developed afib rvr, abdominal process,  resp failure, ETT placed and renal failure, sepsis.  LINES / TUBES: 1/28 OTT>> 1/28 l i j cvl>> Rt fem aline 1/29>>> 1/29 rt ij hd>>>  CULTURES: Blood 1/27 >>  resp virus 1/28 >> Neg Flu pcr 1/28 >> NEG 1/29 sputum>> 1/30 uc>>neg  ANTIBIOTICS: Doxycycline (chronic)>>> Held 1/29 tamiflu 1/28 (ongoing till resp virus multiplex pcr results return) >> 1/29 1/29 vanc>>> 1/29 zosyn>>>  SIGNIFICANT EVENTS / STUDIES:  1/27 CT chest: Negative for PE.  Postop changes right upper lobectomy  Interval increased mediastinal adenopathy. Largest lymph nodes in the left prevascular/ AP window space and the right paratracheal region.  Small chronic residual right subpulmonic effusion with small loculated components and pleural thickening all appearing chronic.  Centrilobular emphysema.   1/28 intubated 1/29 Transferred to [facility]  1/30 cvvhd 2/1 planned to extubate  SUBJECTIVE/OVERNIGHT/INTERVAL HX- Off CRRT  VITAL SIGNS: Temp:  [96.9 F (36.1 C)-97.7 F (36.5 C)] 97.3 F (36.3 C) (02/01 0800) Pulse Rate:  [81-120] 101 (02/01 0800) Resp:  [23-25] 24 (02/01 0800) BP: (88-131)/(42-77) 118/61 mmHg (02/01 0800) SpO2:  [99 %-100 %] 100 % (02/01 0800) Arterial Line BP: (106-133)/(49-66) 121/59 mmHg (02/01 0700) FiO2 (%):  [40 %] 40 % (02/01 0800) Weight:  [158 lb 1.1 oz (71.7 kg)] 158 lb 1.1 oz (71.7 kg) (02/01 0500) HEMODYNAMICS: CVP:  [10 mmHg-81 mmHg] 11 mmHg VENTILATOR SETTINGS: Vent Mode:  [-] PRVC FiO2 (%):  [40 %] 40 % Set Rate:  [24 bmp] 24 bmp Vt Set:  [500 mL] 500 mL PEEP:   [5 cmH20] 5 cmH20 Plateau Pressure:  [18 cmH20-23 cmH20] 23 cmH20 INTAKE / OUTPUT: Intake/Output     01/31 0701 - 02/01 0700 02/01 0701 - 02/02 0700   I.V. (mL/kg) 1015.5 (14.2)    NG/GT 20    IV Piggyback 694    Total Intake(mL/kg) 1729.5 (24.1)    Urine (mL/kg/hr) 575 (0.3)    Emesis/NG output     Other 2318 (1.3) 103 (0.5)   Total Output 2893 103   Net -1163.5 -103          PHYSICAL EXAMINATION: General:  Sedated on vent,  Neuro:  Sedated.   HEENT:  EOMI, PERRL OTT-Vent, OGT Cardiovascular: Irreg rate and rhythm, tachy, no M Lungs: diffuse coarse breath sounds.  Abdomen: Soft, no tenderness,  +bs Ext: warm, no edema, diminished DP pulses  LABS: PULMONARY  Recent Labs Lab 08/17/13 1535 08/18/13 0515 08/18/13 2105 08/19/13 1120 08/19/13 1317  PHART 7.353 7.206* 7.119* 7.279* 7.370  PCO2ART 34.2* 42.3 42.0 51.2* 39.7  PO2ART 322.0* 101.0* 84.8 78.0* 70.0*  HCO3 18.5* 16.1* 13.2* 24.1* 23.2  TCO2 16.9 14.9 12.7 26 24   O2SAT 99.5 96.0 94.0 94.0 94.0    CBC  Recent Labs Lab 08/19/13 0320 08/20/13 0400 08/21/13 0425  HGB 12.5* 12.4* 12.1*  HCT 38.9* 37.4* 35.9*  WBC 29.1* 25.2* 18.6*  PLT 113* 90* 69*    COAGULATION  Recent Labs Lab 08/18/13 0025  INR 2.48*    CARDIAC    Recent Labs Lab 08/17/13 0305 08/17/13 1149  08/17/13 2240 08/18/13 0527 08/18/13 2140  TROPONINI <0.30 <0.30 <0.30 <0.30 <0.30    Recent Labs Lab 08/17/13 0305 08/19/13 0320  PROBNP 2840.0* 22750.0*     CHEMISTRY  Recent Labs Lab 08/17/13 0305  08/18/13 0527  08/19/13 0320 08/19/13 1618 08/20/13 0400 08/20/13 1702 08/21/13 0425 08/21/13 0500  NA 136*  --  136*  < > 135* 133* 137 135*  --  137  K 4.2  --  4.8  < > 4.4 4.2 4.6 4.2  --  4.4  CL 103  --  102  < > 100 97 99 98  --  100  CO2 17*  --  15*  < > 18* 23 25 23   --  23  GLUCOSE 211*  --  240*  < > 209* 153* 91 127*  --  121*  BUN 17  --  29*  < > 39* 27* 21 22  --  20  CREATININE 0.70  --  1.57*   < > 2.61* 1.66* 1.27 1.13  --  1.00  CALCIUM 7.5*  --  8.0*  < > 7.0* 6.7* 7.1* 7.3*  --  7.4*  MG  --   --  2.0  --  1.7  --  2.1  --  2.6*  --   PHOS  --   < > 5.7*  --  6.9* 2.7 2.5 3.0 2.3 2.3  < > = values in this interval not displayed. Estimated Creatinine Clearance: 65.8 ml/min (by C-G formula based on Cr of 1).   LIVER  Recent Labs Lab 08/18/13 0025  08/18/13 2140 08/19/13 0320 08/19/13 1618 08/20/13 0400 08/20/13 1702 08/21/13 0500  AST  --   --  160* 936*  --  1052*  --   --   ALT  --   --  172* 829*  --  1491*  --   --   ALKPHOS  --   --  65 60  --  62  --   --   BILITOT  --   --  0.6 0.7  --  1.4*  --   --   PROT  --   --  5.0* 4.6*  --  5.2*  --   --   ALBUMIN  --   < > 2.7* 2.5* 2.7* 3.0* 2.8* 2.8*  INR 2.48*  --   --   --   --   --   --   --   < > = values in this interval not displayed.   INFECTIOUS  Recent Labs Lab 08/16/13 1953 08/18/13 2140 08/19/13 1130  LATICACIDVEN 2.05 3.1* 2.4*     ENDOCRINE CBG (last 3)   Recent Labs  08/21/13 0353 08/21/13 0719 08/21/13 0721  GLUCAP 111* 156* 104*    IMAGING x48h  Dg Chest Port 1 View  08/20/2013   CLINICAL DATA:  Acute respiratory failure. On ventilator.  EXAM: PORTABLE CHEST - 1 VIEW  COMPARISON:  08/19/2013  FINDINGS: Endotracheal tube tip is approximately 1.5 cm above the carina. Other support lines and tubes remain in appropriate position. No pneumothorax identified.  Increased opacity seen in the left retrocardiac lung base, which may be due to atelectasis or consolidation. Small right pleural effusion also shows mild increase in size. Left pleural effusion cannot be excluded on this portable exam.  IMPRESSION: Increased left retrocardiac atelectasis versus consolidation.  Mild increase in small right pleural effusion. Small left pleural effusion cannot be excluded on this portable exam.  Endotracheal tube tip approximately 1.5 cm above carina.   Electronically Signed   By: Earle Gell M.D.   On:  08/20/2013 07:56    ASSESSMENT / PLAN:  PULMONARY A:  Acute on chronic respiratory failure AECOPD Hx of lung cancer s/p resection 2009 Increased mediatinal LAN, nonspecific Edema increased 1/31  P:   - For neg balance on cvvhd - attempt extubation  2/1  CARDIOVASCULAR A:  Nonspecific chest pain PRWP on EKG New onset AFRVR Peripheral vascular disease P:  - Diltiazem off in shock. - Amio off, restart if rvr noted. - Neo to map goals, max at 200, vaso.OFF PRESSORS 2/1 - Stress dose steroids. - Tsh (1.998).   Intake/Output Summary (Last 24 hours) at 08/21/13 0943 Last data filed at 08/21/13 0800  Gross per 24 hour  Intake 1595.86 ml  Output   2862 ml  Net -1266.14 ml   RENAL Lab Results  Component Value Date   CREATININE 1.00 08/21/2013   CREATININE 1.13 08/20/2013   CREATININE 1.27 08/20/2013    Recent Labs Lab 08/20/13 0400 08/20/13 1702 08/21/13 0500  K 4.6 4.2 4.4   A: New renal failure, no hydro on CT, ATN likely, AG acidosis ( uremia, lactic acid) P:   cvvhd Renal following   GASTROINTESTINAL A:  Chronic PPI use, shock liver likely, unlikley abdo ischemia P:   SUP: Cont home dose of ppi Acute hep panel Repeat lft   hida if bili worsens, changes on ct are from anasarca ast 936->1052 Bilirubin 0.7->1.4 Alt 829->1491  HEMATOLOGIC A:  Thrombocytopenia (sepsis), unknown chronicity P: hep iv for fib Plat repeat  113->90->69. 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  Note platelets dropping on heparin(for afib)   Richardson Landry Minor ACNP Maryanna Shape PCCM Pager [phone] till 3 pm If no answer page [phone] 08/21/2013, 9:43 AM  Platelet dropping but not enough to stop heparin for a-fib.  Did not tolerate weaning trials, will continue PS as tolerated.  CC time 35 min.  Patient seen and examined, agree with above note.  I dictated the care and orders written for this patient under my direction.  Rush Farmer, MD [phone]

## 2013-08-21 NOTE — Progress Notes (Signed)
ANTICOAGULATION CONSULT NOTE - Follow Up Consult  Pharmacy Consult:  Heparin Indication: atrial fibrillation  Allergies  Allergen Reactions  . Lactose Intolerance (Gi)   . Shellfish Allergy Diarrhea and Nausea And Vomiting  . Strawberry Rash    Patient Measurements: Height: 5\' 9"  (175.3 cm) Weight: 158 lb 1.1 oz (71.7 kg) IBW/kg (Calculated) : 70.7 Heparin Dosing Weight: 72 kg  Vital Signs: Temp: 98 F (36.7 C) (02/01 1139) Temp src: Core (Comment) (02/01 0600) BP: 95/68 mmHg (02/01 1139) Pulse Rate: 108 (02/01 1139)  Labs:  Recent Labs  08/18/13 2140 08/19/13 0320  08/20/13 0400 08/20/13 1702 08/21/13 0425 08/21/13 0500 08/21/13 1300  HGB 13.5 12.5*  --  12.4*  --  12.1*  --   --   HCT 43.7 38.9*  --  37.4*  --  35.9*  --   --   PLT 120* 113*  --  90*  --  69*  --   --   HEPARINUNFRC  --   --   < > 0.64  --  0.20*  --  0.29*  CREATININE 2.53* 2.61*  < > 1.27 1.13  --  1.00  --   TROPONINI <0.30  --   --   --   --   --   --   --   < > = values in this interval not displayed.  Estimated Creatinine Clearance: 65.8 ml/min (by C-G formula based on Cr of 1).     Assessment: 72 YOM on IV heparin for new Afib with RVR.  Heparin level slightly sub-therapeutic.  No bleeding reported and AST/ALT are trending down.  Platelets trended down further and CCM considering stopping IV heparin.  Pharmacy also managing vancomycin and Zosyn for sepsis.  Patient is now off CRRT with plan to transition to intermittent HD.  Doxy 1/27 >> 1/29 Tamiflu 1/28 >> 1/29 Zosyn 1/29 >> Vanc 1/29 >>  1/27 blood - NGTD 1/28 Trach aspirate - negative 1/30 BAL - GPC on Gram stain 1/30 UCx - negative   Goal of Therapy:  Heparin level 0.3-0.7 units/ml Monitor platelets by anticoagulation protocol: Yes Vanc trough 15-20 mcg/mL    Plan:  - Increase heparin gtt to 950 units/hr - Check 8 hr HL - Daily HL / CBC - Continue vanc 1gm IV Q24H - Change Zosyn to 2.25gm IV Q8H - Monitor  renal fxn post CRRT discontinuation, clinical progression, vanc trough as indicated    Whittany Parish D. Mina Marble, PharmD, BCPS Pager:  [phone] 08/21/2013, 2:10 PM

## 2013-08-22 ENCOUNTER — Inpatient Hospital Stay (HOSPITAL_COMMUNITY): Payer: Medicare Other

## 2013-08-22 DIAGNOSIS — R9431 Abnormal electrocardiogram [ECG] [EKG]: Secondary | ICD-10-CM

## 2013-08-22 LAB — COMPREHENSIVE METABOLIC PANEL
ALBUMIN: 2.7 g/dL — AB (ref 3.5–5.2)
ALK PHOS: 57 U/L (ref 39–117)
ALT: 750 U/L — AB (ref 0–53)
AST: 166 U/L — AB (ref 0–37)
BILIRUBIN TOTAL: 1.5 mg/dL — AB (ref 0.3–1.2)
BUN: 33 mg/dL — ABNORMAL HIGH (ref 6–23)
CHLORIDE: 101 meq/L (ref 96–112)
CO2: 22 meq/L (ref 19–32)
Calcium: 7.5 mg/dL — ABNORMAL LOW (ref 8.4–10.5)
Creatinine, Ser: 1.57 mg/dL — ABNORMAL HIGH (ref 0.50–1.35)
GFR calc Af Amer: 49 mL/min — ABNORMAL LOW (ref >90)
GFR calc non Af Amer: 42 mL/min — ABNORMAL LOW (ref >90)
Glucose, Bld: 151 mg/dL — ABNORMAL HIGH (ref 70–99)
POTASSIUM: 4.3 meq/L (ref 3.7–5.3)
Sodium: 139 mEq/L (ref 137–147)
Total Protein: 4.9 g/dL — ABNORMAL LOW (ref 6.0–8.3)

## 2013-08-22 LAB — RENAL FUNCTION PANEL
Albumin: 2.3 g/dL — ABNORMAL LOW (ref 3.5–5.2)
BUN: 39 mg/dL — ABNORMAL HIGH (ref 6–23)
CO2: 24 meq/L (ref 19–32)
Calcium: 7.5 mg/dL — ABNORMAL LOW (ref 8.4–10.5)
Chloride: 102 mEq/L (ref 96–112)
Creatinine, Ser: 1.68 mg/dL — ABNORMAL HIGH (ref 0.50–1.35)
GFR calc non Af Amer: 39 mL/min — ABNORMAL LOW (ref >90)
GFR, EST AFRICAN AMERICAN: 45 mL/min — AB (ref >90)
Glucose, Bld: 129 mg/dL — ABNORMAL HIGH (ref 70–99)
Phosphorus: 2.9 mg/dL (ref 2.3–4.6)
Potassium: 4.3 mEq/L (ref 3.7–5.3)
SODIUM: 139 meq/L (ref 137–147)

## 2013-08-22 LAB — HEPARIN LEVEL (UNFRACTIONATED)
Heparin Unfractionated: 0.24 IU/mL — ABNORMAL LOW (ref 0.30–0.70)
Heparin Unfractionated: 0.37 IU/mL (ref 0.30–0.70)

## 2013-08-22 LAB — PHOSPHORUS
PHOSPHORUS: 2.7 mg/dL (ref 2.3–4.6)
Phosphorus: 2.7 mg/dL (ref 2.3–4.6)

## 2013-08-22 LAB — GLUCOSE, CAPILLARY
GLUCOSE-CAPILLARY: 134 mg/dL — AB (ref 70–99)
GLUCOSE-CAPILLARY: 124 mg/dL — AB (ref 70–99)
GLUCOSE-CAPILLARY: 112 mg/dL — AB (ref 70–99)
Glucose-Capillary: 126 mg/dL — ABNORMAL HIGH (ref 70–99)
Glucose-Capillary: 132 mg/dL — ABNORMAL HIGH (ref 70–99)
Glucose-Capillary: 139 mg/dL — ABNORMAL HIGH (ref 70–99)

## 2013-08-22 LAB — CBC
HEMATOCRIT: 34.2 % — AB (ref 39.0–52.0)
Hemoglobin: 11.5 g/dL — ABNORMAL LOW (ref 13.0–17.0)
MCH: 30.2 pg (ref 26.0–34.0)
MCHC: 33.6 g/dL (ref 30.0–36.0)
MCV: 89.8 fL (ref 78.0–100.0)
Platelets: 85 10*3/uL — ABNORMAL LOW (ref 150–400)
RBC: 3.81 MIL/uL — ABNORMAL LOW (ref 4.22–5.81)
RDW: 15.2 % (ref 11.5–15.5)
WBC: 18.2 10*3/uL — ABNORMAL HIGH (ref 4.0–10.5)

## 2013-08-22 LAB — CULTURE, BLOOD (ROUTINE X 2): Culture: NO GROWTH

## 2013-08-22 LAB — PATHOLOGIST SMEAR REVIEW

## 2013-08-22 LAB — POCT I-STAT 3, ART BLOOD GAS (G3+)
Bicarbonate: 24.1 mEq/L — ABNORMAL HIGH (ref 20.0–24.0)
O2 Saturation: 99 %
PO2 ART: 113 mmHg — AB (ref 80.0–100.0)
TCO2: 25 mmol/L (ref 0–100)
pCO2 arterial: 35.6 mmHg (ref 35.0–45.0)
pH, Arterial: 7.438 (ref 7.350–7.450)

## 2013-08-22 LAB — MAGNESIUM: Magnesium: 2.8 mg/dL — ABNORMAL HIGH (ref 1.5–2.5)

## 2013-08-22 MED ORDER — HEPARIN (PORCINE) IN NACL 100-0.45 UNIT/ML-% IJ SOLN
1050.0000 [IU]/h | INTRAMUSCULAR | Status: DC
  Administered 2013-08-22 – 2013-08-23 (×2): 1050 [IU]/h via INTRAVENOUS
  Filled 2013-08-22: qty 250

## 2013-08-22 MED ORDER — METOPROLOL TARTRATE 1 MG/ML IV SOLN: 5.0000 mg | INTRAVENOUS | Status: DC | PRN

## 2013-08-22 MED ORDER — DEXTROSE 5 % IV SOLN
1.0000 g | Freq: Every day | INTRAVENOUS | Status: AC
  Administered 2013-08-22 – 2013-08-25 (×4): 1 g via INTRAVENOUS
  Filled 2013-08-22 (×4): qty 10

## 2013-08-22 MED ORDER — HYDROCORTISONE NA SUCCINATE PF 100 MG IJ SOLR
25.0000 mg | Freq: Four times a day (QID) | INTRAMUSCULAR | Status: DC
  Administered 2013-08-22 – 2013-08-24 (×7): 25 mg via INTRAVENOUS
  Filled 2013-08-22 (×11): qty 0.5

## 2013-08-22 MED ORDER — PRO-STAT SUGAR FREE PO LIQD
30.0000 mL | Freq: Two times a day (BID) | ORAL | Status: DC
  Filled 2013-08-22 (×2): qty 30

## 2013-08-22 MED ORDER — VITAL AF 1.2 CAL PO LIQD
1000.0000 mL | ORAL | Status: DC
  Administered 2013-08-22 – 2013-08-27 (×6): 1000 mL
  Filled 2013-08-22 (×13): qty 1000

## 2013-08-22 MED ORDER — METOPROLOL TARTRATE 12.5 MG HALF TABLET
12.5000 mg | ORAL_TABLET | Freq: Four times a day (QID) | ORAL | Status: DC
  Administered 2013-08-22 – 2013-08-25 (×7): 12.5 mg via ORAL
  Filled 2013-08-22 (×16): qty 1

## 2013-08-22 MED ORDER — VITAL HIGH PROTEIN PO LIQD
1000.0000 mL | ORAL | Status: DC
  Filled 2013-08-22 (×2): qty 1000

## 2013-08-22 MED ORDER — DILTIAZEM HCL 25 MG/5ML IV SOLN
15.0000 mg | Freq: Once | INTRAVENOUS | Status: AC
  Administered 2013-08-22: 15 mg via INTRAVENOUS
  Filled 2013-08-22: qty 5

## 2013-08-22 NOTE — Progress Notes (Signed)
ANTICOAGULATION CONSULT NOTE - Follow Up Consult  Pharmacy Consult for heparin Indication: atrial fibrillation  Labs:  Recent Labs  08/20/13 0400  08/21/13 0425  08/21/13 1600 08/21/13 2300 08/22/13 0429 08/22/13 1600 08/22/13 2227  HGB 12.4*  --  12.1*  --   --   --  11.5*  --   --   HCT 37.4*  --  35.9*  --   --   --  34.2*  --   --   PLT 90*  --  69*  --   --   --  85*  --   --   HEPARINUNFRC 0.64  --  0.20*  < >  --  0.17* 0.24*  --  0.37  CREATININE 1.27  < >  --   < > 1.35  --  1.57* 1.68*  --   < > = values in this interval not displayed.   Assessment/Plan:  73yo male therapeutic on heparin after rate increase.  Will continue gtt at current rate and confirm stable with am labs.  Wynona Neat, PharmD, BCPS  08/22/2013,11:16 PM

## 2013-08-22 NOTE — Progress Notes (Signed)
Patient ID: [name], male   DOB: April 13, 1941, 73 y.o.   MRN: [id number] Date of Encounter: 08/22/2013  Principal Problem:   Acute on chronic respiratory failure Active Problems:   Acute exacerbation of chronic obstructive pulmonary disease (COPD)   History of lung cancer, s/p RUL resection 2009 @ [place]   Mediastinal adenopathy   Smoker   Atrial fibrillation with RVR   PRWP on ECG   New onset atrial fibrillation   Chest tightness   Peripheral vascular disease   Vascular graft infection, on chronic doxycycline   Thrombocytopenia   Acute respiratory failure   Acute combined systolic and diastolic heart failure   Septic shock(785.52)   Length of Stay: 6  SUBJECTIVE  Intubated, sedated, calm. AF with mild RVR Off pressors, mildly hypotensive CRRT stopped with plans for intermittent hemodialysis Creat is 1.0 (artifactually low post CRRT) Platelets have dropped substantially, but gradually, going up and down.   CURRENT MEDS . antiseptic oral rinse  15 mL Mouth Rinse q12n4p  . aspirin  81 mg Oral Daily  . budesonide  0.25 mg Nebulization Q6H  . chlorhexidine  15 mL Mouth Rinse BID  . clopidogrel  75 mg Oral Q breakfast  . hydrocortisone sod succinate (SOLU-CORTEF) inj  50 mg Intravenous Q6H  . insulin aspart  0-9 Units Subcutaneous Q4H  . ipratropium  0.5 mg Nebulization Q6H  . levalbuterol  0.63 mg Nebulization Q6H  . pantoprazole (PROTONIX) IV  40 mg Intravenous Q24H  . piperacillin-tazobactam (ZOSYN)  IV  2.25 g Intravenous Q8H  . vancomycin  1,000 mg Intravenous Q24H    OBJECTIVE  Filed Vitals:   08/22/13 0900 08/22/13 1000 08/22/13 1100 08/22/13 1102  BP: 111/76 126/73 100/72 130/79  Pulse: 60 52 35 125  Temp: 98.1 F (36.7 C) 98.1 F (36.7 C) 98.1 F (36.7 C)   TempSrc:      Resp: 24 24 21 24   Height:      Weight:      SpO2: 100% 100% 100% 100%    Intake/Output Summary (Last 24 hours) at 08/22/13 1217 Last data filed at 08/22/13 1150  Gross per 24  hour  Intake 1307.15 ml  Output    910 ml  Net 397.15 ml   Filed Weights   08/20/13 0500 08/21/13 0500 08/22/13 0500  Weight: 159 lb 6.3 oz (72.3 kg) 158 lb 1.1 oz (71.7 kg) 155 lb 6.8 oz (70.5 kg)    PHYSICAL EXAM  General: Pleasant, NAD. Neuro: Alert and oriented X 3. Moves all extremities spontaneously. Seems quite agitated after weaning protocol HEENT:  Normal  Neck: Supple without bruits or JVD. Lungs:  Resp regular and unlabored, CTA. Heart: irregularly irregular tachycardia; unable to auscultate M./R./G. Center due to tachycardic rate and Abdomen: Soft, non-tender, non-distended, BS + x 4.  Extremities: No clubbing, cyanosis or edema. DP/PT/Radials 2+ and equal bilaterally.  Accessory Clinical Findings  CBC  Recent Labs  08/20/13 0400 08/21/13 0425 08/22/13 0429  WBC 25.2* 18.6* 18.2*  NEUTROABS 15.3* 10.9*  --   HGB 12.4* 12.1* 11.5*  HCT 37.4* 35.9* 34.2*  MCV 92.1 91.3 89.8  PLT 90* 69* 85*   Basic Metabolic Panel  Recent Labs  08/20/13 0400  08/21/13 0425  08/21/13 1600 08/22/13 0429  NA 137  < >  --   < > 137 139  K 4.6  < >  --   < > 4.5 4.3  CL 99  < >  --   < >  102 101  CO2 25  < >  --   < > 22 22  GLUCOSE 91  < >  --   < > 127* 151*  BUN 21  < >  --   < > 27* 33*  CREATININE 1.27  < >  --   < > 1.35 1.57*  CALCIUM 7.1*  < >  --   < > 7.2* 7.5*  MG 2.1  --  2.6*  --   --   --   PHOS 2.5  < > 2.3  < > 2.3 2.7  < > = values in this interval not displayed. Liver Function Tests  Recent Labs  08/21/13 1105 08/21/13 1600 08/22/13 0429  AST 223*  --  166*  ALT 937*  --  750*  ALKPHOS 56  --  57  BILITOT 1.3*  --  1.5*  PROT 5.1*  --  4.9*  ALBUMIN 2.7* 2.5* 2.7*   No results found for this basename: LIPASE, AMYLASE,  in the last 72 hours Cardiac Enzymes No results found for this basename: CKTOTAL, CKMB, CKMBINDEX, TROPONINI,  in the last 72 hours BNP No components found with this basename: POCBNP,  D-Dimer No results found for this  basename: DDIMER,  in the last 72 hours Hemoglobin A1C No results found for this basename: HGBA1C,  in the last 72 hours Fasting Lipid Panel No results found for this basename: CHOL, HDL, LDLCALC, TRIG, CHOLHDL, LDLDIRECT,  in the last 72 hours Thyroid Function Tests No results found for this basename: TSH, T4TOTAL, FREET3, T3FREE, THYROIDAB,  in the last 72 hours  Radiology/Studies Chest x-ray December 2(personally reviewed): ET tube in place. Bibasilar atelectasis with probable bilateral effusions. No PTX -- stable Echocardiogram in January 28:EF 15-20% with a likely grade 3 diastolic dysfunction. Moderate MR.  TELE AFib, mild RVR  ASSESSMENT AND PLAN Now with a weaning trial, he been relatively stable, but he has had some bradycardic as well as a cardiac episode. Currently he is nature fibrillation ranging as high as 170 beats a minute after weaning protocol. He was given 10 mg IV diltiazem and has had some improvement. As his blood pressure now seems to be improving, we can consider a standing dose of beta blocker  With A. Fib, will need long-term anticoagulations, if no invasive procedures are planned, start warfarin so we can discontinue heparin. But would continue heparin until INR therapeutic.  If he were to decompensate, overall standpoint hemodynamically, would consider TEE guided DCCV.  Leonie Man, M.D., M.S. [facility] [address] [place], [place]  [id number]  [phone] Pager # [phone] 08/22/2013 12:22 PM

## 2013-08-22 NOTE — Progress Notes (Signed)
Chaplain offered emotional and spiritual support to pt's wife, Ivin Booty. She expressed feeling encouraged that pt's kidneys were functioning without dialysis. She described strong, healthy family support and faith community support. Chaplain provided emotional and spiritual care, caring presence, and empathic listening. Will follow up. Please page if needed.   [facility]: [phone]

## 2013-08-22 NOTE — Progress Notes (Signed)
Subjective:  Off CRRT yesterday UOP 770 yesterday No pressors Remains intubated  Objective Vital signs in last 24 hours: Filed Vitals:   08/22/13 1200 08/22/13 1300 08/22/13 1400 08/22/13 1453  BP: 96/68  90/58 121/57  Pulse: 115 90 87 99  Temp: 98.3 F (36.8 C) 98.3 F (36.8 C) 98.3 F (36.8 C)   TempSrc: Core (Comment)     Resp: 24 24 24    Height:      Weight:      SpO2: 100% 100% 100%    Weight change: -1.2 kg (-2 lb 10.3 oz)  Intake/Output Summary (Last 24 hours) at 08/22/13 1541 Last data filed at 08/22/13 1300  Gross per 24 hour  Intake 1266.48 ml  Output    910 ml  Net 356.48 ml    Physical Exam:  Blood pressure 122/45, pulse 79, temperature 97.5 F (36.4 C), temperature source Core (Comment), resp. rate 24, height 5\' 9"  (1.753 m), weight 72.3 kg (159 lb 6.3 oz), SpO2 99.00%.  General:. right ij temp cath (1/30)   Eyes: Sclera clear, no icterus.  Mouth: orotracheal tube and OGT  Lungs: Coarse breath sounds Heart: Irregular rate and rhythm; no murmurs, clicks, rubs, or gallops.  Abdomen: Firm but not tender. Hypoactive bowel sounds  Extremities: Trace pretibial edema only  Labs: Basic Metabolic Panel:  Recent Labs Lab 08/19/13 0320 08/19/13 1618 08/20/13 0400 08/20/13 1702 08/21/13 0425 08/21/13 0500 08/21/13 1600 08/22/13 0429 08/22/13 1337  NA 135* 133* 137 135*  --  137 137 139  --   K 4.4 4.2 4.6 4.2  --  4.4 4.5 4.3  --   CL 100 97 99 98  --  100 102 101  --   CO2 18* 23 25 23   --  23 22 22   --   GLUCOSE 209* 153* 91 127*  --  121* 127* 151*  --   BUN 39* 27* 21 22  --  20 27* 33*  --   CREATININE 2.61* 1.66* 1.27 1.13  --  1.00 1.35 1.57*  --   CALCIUM 7.0* 6.7* 7.1* 7.3*  --  7.4* 7.2* 7.5*  --   PHOS 6.9* 2.7 2.5 3.0 2.3 2.3 2.3 2.7 2.7     Recent Labs Lab 08/20/13 0400  08/21/13 1105 08/21/13 1600 08/22/13 0429  AST 1052*  --  223*  --  166*  ALT 1491*  --  937*  --  750*  ALKPHOS 62  --  56  --  57  BILITOT 1.4*  --  1.3*   --  1.5*  PROT 5.2*  --  5.1*  --  4.9*  ALBUMIN 3.0*  < > 2.7* 2.5* 2.7*  < > = values in this interval not displayed.  Recent Labs Lab 08/19/13 0320  LIPASE 46    Recent Labs Lab 08/18/13 0527  08/19/13 0320 08/20/13 0400 08/21/13 0425 08/22/13 0429  WBC 21.1*  < > 29.1* 25.2* 18.6* 18.2*  NEUTROABS 9.7*  --  16.9* 15.3* 10.9*  --   HGB 13.5  < > 12.5* 12.4* 12.1* 11.5*  HCT 42.4  < > 38.9* 37.4* 35.9* 34.2*  MCV 96.1  < > 95.6 92.1 91.3 89.8  PLT 136*  < > 113* 90* 69* 85*  < > = values in this interval not displayed.  Recent Labs Lab 08/17/13 0305 08/17/13 1149 08/17/13 2240 08/18/13 0527 08/18/13 2140  TROPONINI <0.30 <0.30 <0.30 <0.30 <0.30    Recent Labs Lab 08/21/13 1956 08/21/13  2355 08/22/13 0353 08/22/13 0744 08/22/13 1150  GLUCAP 129* 139* 134* 132* 126*   Lab Results  Component Value Date   INR 2.48* 08/18/2013   INR 1.09 03/27/2010   INR 1.4 03/29/2008   Studies/Results: Dg Chest Port 1 View  08/22/2013   CLINICAL DATA:  Endotracheal tube position.  EXAM: PORTABLE CHEST - 1 VIEW  COMPARISON:  DG CHEST 1V PORT dated 08/20/2013; DG CHEST 1V PORT dated 08/19/2013  FINDINGS: 0556 hr. The endotracheal tube tip is in the midtrachea, 3.6 cm above the carina. The nasogastric tube and bilateral central lines are unchanged. The heart size and mediastinal contours are stable. Bibasilar atelectasis and probable bilateral pleural effusions are unchanged. There is no pneumothorax.  IMPRESSION: Support system position as above. No significant change in appearance of the mediastinum or lungs.   Electronically Signed   By: Camie Patience M.D.   On: 08/22/2013 07:13   Medications: . sodium chloride 20 mL/hr at 08/22/13 0600  . feeding supplement (VITAL AF 1.2 CAL)    . fentaNYL infusion INTRAVENOUS 150 mcg/hr (08/22/13 1153)  . heparin 1,050 Units/hr (08/22/13 1245)  . phenylephrine (NEO-SYNEPHRINE) Adult infusion 50 mcg/min (08/20/13 5732)  . vasopressin (PITRESSIN)  infusion - *FOR SHOCK* 0.03 Units/min (08/19/13 2205)   . antiseptic oral rinse  15 mL Mouth Rinse q12n4p  . aspirin  81 mg Oral Daily  . budesonide  0.25 mg Nebulization Q6H  . cefTRIAXone (ROCEPHIN)  IV  1 g Intravenous Daily  . chlorhexidine  15 mL Mouth Rinse BID  . clopidogrel  75 mg Oral Q breakfast  . hydrocortisone sod succinate (SOLU-CORTEF) inj  25 mg Intravenous Q6H  . insulin aspart  0-9 Units Subcutaneous Q4H  . ipratropium  0.5 mg Nebulization Q6H  . levalbuterol  0.63 mg Nebulization Q6H  . metoprolol tartrate  12.5 mg Oral Q6H  . pantoprazole (PROTONIX) IV  40 mg Intravenous Q24H   . sodium chloride 20 mL/hr at 08/22/13 0600  . feeding supplement (VITAL AF 1.2 CAL)    . fentaNYL infusion INTRAVENOUS 150 mcg/hr (08/22/13 1153)  . heparin 1,050 Units/hr (08/22/13 1245)  . phenylephrine (NEO-SYNEPHRINE) Adult infusion 50 mcg/min (08/20/13 2025)  . vasopressin (PITRESSIN) infusion - *FOR SHOCK* 0.03 Units/min (08/19/13 2205)    Assessment/Plan 73 yo with acute on chronic respiratory failure, COPD, h/o lung CA resection, systolic heart failure (EF 25%), AF with RVR;  Shock liver; subsequent development of oliguric AKI, metabolic acidosis, hyperkalemia in the setting of respiratory failure, pressor dependent hypotension, IV contrast (1/27). On NSAIDs PTA  (Baseline creatinine 0.7).  CRRT 08/19/13 - 08/21/13  AKI oliguric 2/2 ATN  Recommendations: Follow daily for need of intermittent HD, no indicated today OK with furosemide trial Reassess tomorrow for need Phos will drift back up off CRRT (repleted yesterday)   Pearson Grippe, MD 08/22/2013, 3:41 PM

## 2013-08-22 NOTE — Progress Notes (Deleted)
Dr. Titus Mould notified of positive ESBL in tracheal aspirate.

## 2013-08-22 NOTE — Progress Notes (Signed)
ANTICOAGULATION CONSULT NOTE - Follow Up Consult  Pharmacy Consult for heparin Indication: atrial fibrillation  Labs:  Recent Labs  08/19/13 0320  08/20/13 0400 08/20/13 1702 08/21/13 0425 08/21/13 0500 08/21/13 1300 08/21/13 1600 08/21/13 2300  HGB 12.5*  --  12.4*  --  12.1*  --   --   --   --   HCT 38.9*  --  37.4*  --  35.9*  --   --   --   --   PLT 113*  --  90*  --  69*  --   --   --   --   HEPARINUNFRC  --   < > 0.64  --  0.20*  --  0.29*  --  0.17*  CREATININE 2.61*  < > 1.27 1.13  --  1.00  --  1.35  --   < > = values in this interval not displayed.   Assessment/Plan:  73yo male now with lower heparin level despite rate increase and prior level just below goal.  Now off CRRT so may have lower total heparin.  Given concern for thrombocytopenia will continue without change for now and recheck with am labs.  Wynona Neat, PharmD, BCPS  08/22/2013,12:04 AM

## 2013-08-22 NOTE — Progress Notes (Signed)
UR Completed.  Vergie Living [id number] 08/22/2013

## 2013-08-22 NOTE — Progress Notes (Signed)
NUTRITION FOLLOW UP  Intervention:    Utilize 37M PEPuP Protocol: initiate TF via OGT with Vital AF 1.2 at 25 ml/h on day 1; on day 2, increase to goal rate of 60 ml/h (1440 ml per day) to provide 1728 kcals, 108 gm protein, 1168 ml free water daily.  Nutrition Dx:   Inadequate oral intake related to inability to eat as evidenced by NPO. Ongoing.  Goal:   Intake to meet >90% of estimated nutrition needs. Unmet.  Monitor:   TF tolerance/adequacy, weight trend, labs, vent status.  Assessment:   Patient is a 73 y.o. M with COPD, hx of lung ca, PVD, current smoker presented to [facility] ED on 1/27 with 2 wks of progressive dyspnea and chest tightness minimally and transiently responsive to albuterol MDI. Developed afib rvr, abdominal process, resp failure, ETT placed and renal failure, sepsis. Transferred to [facility] on 1/29. Patient is lactose intolerant, allergic to strawberries and shellfish.   Discussed patient in ICU rounds today. CRRT was discontinued on 2/1. Received MD Consult for TF initiation and management.  Patient is currently intubated on ventilator support.  MV: 11.6 L/min Temp (24hrs), Avg:99.2 F (37.3 C), Min:98.1 F (36.7 C), Max:100.2 F (37.9 C)   Height: Ht Readings from Last 1 Encounters:  08/16/13 5\' 9"  (1.753 m)    Weight Status:   Wt Readings from Last 1 Encounters:  08/22/13 155 lb 6.8 oz (70.5 kg)  08/19/13  156 lb 15.5 oz (71.2 kg)  08/17/13  140 lb 6.9 oz (63.7 kg)   Re-estimated needs:  Kcal: 1800 Protein: 90-100 gm Fluid: >/= 1.6 L  Skin: no wounds  Diet Order: NPO   Intake/Output Summary (Last 24 hours) at 08/22/13 1339 Last data filed at 08/22/13 1150  Gross per 24 hour  Intake 1278.65 ml  Output    910 ml  Net 368.65 ml    Last BM: 1/27   Labs:   Recent Labs Lab 08/19/13 0320  08/20/13 0400  08/21/13 0425 08/21/13 0500 08/21/13 1600 08/22/13 0429  NA 135*  < > 137  < >  --  137 137 139  K 4.4  < > 4.6  < >  --  4.4 4.5 4.3   CL 100  < > 99  < >  --  100 102 101  CO2 18*  < > 25  < >  --  23 22 22   BUN 39*  < > 21  < >  --  20 27* 33*  CREATININE 2.61*  < > 1.27  < >  --  1.00 1.35 1.57*  CALCIUM 7.0*  < > 7.1*  < >  --  7.4* 7.2* 7.5*  MG 1.7  --  2.1  --  2.6*  --   --   --   PHOS 6.9*  < > 2.5  < > 2.3 2.3 2.3 2.7  GLUCOSE 209*  < > 91  < >  --  121* 127* 151*  < > = values in this interval not displayed.  CBG (last 3)   Recent Labs  08/22/13 0353 08/22/13 0744 08/22/13 1150  GLUCAP 134* 132* 126*    Scheduled Meds: . antiseptic oral rinse  15 mL Mouth Rinse q12n4p  . aspirin  81 mg Oral Daily  . budesonide  0.25 mg Nebulization Q6H  . cefTRIAXone (ROCEPHIN)  IV  1 g Intravenous Daily  . chlorhexidine  15 mL Mouth Rinse BID  . clopidogrel  75 mg Oral  Q breakfast  . feeding supplement (PRO-STAT SUGAR FREE 64)  30 mL Per Tube BID  . feeding supplement (VITAL HIGH PROTEIN)  1,000 mL Per Tube Q24H  . hydrocortisone sod succinate (SOLU-CORTEF) inj  25 mg Intravenous Q6H  . insulin aspart  0-9 Units Subcutaneous Q4H  . ipratropium  0.5 mg Nebulization Q6H  . levalbuterol  0.63 mg Nebulization Q6H  . metoprolol tartrate  12.5 mg Oral Q6H  . pantoprazole (PROTONIX) IV  40 mg Intravenous Q24H    Continuous Infusions: . sodium chloride 20 mL/hr at 08/22/13 0600  . fentaNYL infusion INTRAVENOUS 150 mcg/hr (08/22/13 1153)  . heparin 1,050 Units/hr (08/22/13 1245)  . phenylephrine (NEO-SYNEPHRINE) Adult infusion 50 mcg/min (08/20/13 8546)  . vasopressin (PITRESSIN) infusion - *FOR SHOCK* 0.03 Units/min (08/19/13 2205)    Molli Barrows, RD, LDN, [place] Pager [phone] After Hours Pager [phone]

## 2013-08-22 NOTE — Progress Notes (Signed)
Name: [name] MRN: [id number] DOB: 09/30/1940    ADMISSION DATE:  08/16/2013  REFERRING MD :  EDP PRIMARY SERVICE: PCCM  CHIEF COMPLAINT:  Dyspnea  BRIEF PATIENT DESCRIPTION:   59 M with COPD, hx of lung ca, PVD, current smoker presented to [facility] ED with 2 wks of progressive dyspnea and chest tightness minimally and transiently responsive to albuterol MDI. Developed afib rvr, abdominal process,  resp failure, ETT placed and renal failure, sepsis.  LINES / TUBES: 1/28 OTT>> 1/28 l i j cvl>> Rt fem aline 1/29>>> 1/29 rt ij hd>>>  CULTURES: Blood 1/27 >> neg resp virus 1/28 >> Neg Flu pcr 1/28 >> NEG 1/29 sputum>> Normal flora 1/30 uc>>neg  ANTIBIOTICS: Doxycycline (chronic)>>> Held 1/29 tamiflu 1/28 (ongoing till resp virus multiplex pcr results return) >> 1/29 1/29 vanc>>>2/2 1/29 zosyn>>>2/2 2/2 ceftriaxone >>>stop 2/5  SIGNIFICANT EVENTS / STUDIES:  1/27 CT chest: Negative for PE.  Postop changes right upper lobectomy  Interval increased mediastinal adenopathy. Largest lymph nodes in the left prevascular/ AP window space and the right paratracheal region.  Small chronic residual right subpulmonic effusion with small loculated components and pleural thickening all appearing chronic.  Centrilobular emphysema.   1/28 intubated 1/29 Transferred to [facility] 1/29 bronch BAL 1/30 cvvhd 2/1 planned to extubate> failed 2/2 CXR unchanged  SUBJECTIVE/OVERNIGHT/INTERVAL HX-   VITAL SIGNS: Temp:  [96.8 F (36 C)-100.2 F (37.9 C)] 98.1 F (36.7 C) (02/02 0900) Pulse Rate:  [45-145] 60 (02/02 0900) Resp:  [12-39] 24 (02/02 0900) BP: (83-155)/(51-89) 111/76 mmHg (02/02 0900) SpO2:  [100 %] 100 % (02/02 0900) Arterial Line BP: (108-156)/(51-88) 134/72 mmHg (02/02 0900) FiO2 (%):  [40 %] 40 % (02/02 0900) Weight:  [155 lb 6.8 oz (70.5 kg)] 155 lb 6.8 oz (70.5 kg) (02/02 0500) HEMODYNAMICS: CVP:  [10 mmHg-12 mmHg] 12 mmHg VENTILATOR SETTINGS: Vent Mode:  [-] PRVC FiO2  (%):  [40 %] 40 % Set Rate:  [24 bmp] 24 bmp Vt Set:  [500 mL] 500 mL PEEP:  [5 cmH20] 5 cmH20 Pressure Support:  [5 cmH20-10 cmH20] 10 cmH20 Plateau Pressure:  [15 cmH20-22 cmH20] 22 cmH20 INTAKE / OUTPUT: Intake/Output     02/01 0701 - 02/02 0700 02/02 0701 - 02/03 0700   I.V. (mL/kg) 972.9 (13.8) 79.8 (1.1)   NG/GT 20    IV Piggyback 350    Total Intake(mL/kg) 1342.9 (19) 79.8 (1.1)   Urine (mL/kg/hr) 770 (0.5) 75 (0.5)   Other 173 (0.1)    Total Output 943 75   Net +399.9 +4.8          PHYSICAL EXAMINATION: General:  Sedated on vent, Neuro:  Sedated.   awakens to name, makes eye contact HEENT:  EOMI, PERRL OTT-Vent, OGT Cardiovascular: Irreg rate and rhythm, tachy, no M Lungs: Mild diffuse coarse breath sounds.  Abdomen: Soft, ? tenderness,  Mildly distended. hypoactive BS Ext: warm, trace edema, diminished DP pulses  LABS: PULMONARY  Recent Labs Lab 08/17/13 1535 08/18/13 0515 08/18/13 2105 08/19/13 1120 08/19/13 1317  PHART 7.353 7.206* 7.119* 7.279* 7.370  PCO2ART 34.2* 42.3 42.0 51.2* 39.7  PO2ART 322.0* 101.0* 84.8 78.0* 70.0*  HCO3 18.5* 16.1* 13.2* 24.1* 23.2  TCO2 16.9 14.9 12.7 26 24   O2SAT 99.5 96.0 94.0 94.0 94.0    CBC  Recent Labs Lab 08/20/13 0400 08/21/13 0425 08/22/13 0429  HGB 12.4* 12.1* 11.5*  HCT 37.4* 35.9* 34.2*  WBC 25.2* 18.6* 18.2*  PLT 90* 69* 85*    COAGULATION  Recent Labs Lab 08/18/13 0025  INR 2.48*    CARDIAC    Recent Labs Lab 08/17/13 0305 08/17/13 1149 08/17/13 2240 08/18/13 0527 08/18/13 2140  TROPONINI <0.30 <0.30 <0.30 <0.30 <0.30    Recent Labs Lab 08/17/13 0305 08/19/13 0320  PROBNP 2840.0* 22750.0*     CHEMISTRY  Recent Labs Lab 08/17/13 0305  08/18/13 0527  08/19/13 0320  08/20/13 0400 08/20/13 1702 08/21/13 0425 08/21/13 0500 08/21/13 1600 08/22/13 0429  NA 136*  --  136*  < > 135*  < > 137 135*  --  137 137 139  K 4.2  --  4.8  < > 4.4  < > 4.6 4.2  --  4.4 4.5 4.3   CL 103  --  102  < > 100  < > 99 98  --  100 102 101  CO2 17*  --  15*  < > 18*  < > 25 23  --  23 22 22   GLUCOSE 211*  --  240*  < > 209*  < > 91 127*  --  121* 127* 151*  BUN 17  --  29*  < > 39*  < > 21 22  --  20 27* 33*  CREATININE 0.70  --  1.57*  < > 2.61*  < > 1.27 1.13  --  1.00 1.35 1.57*  CALCIUM 7.5*  --  8.0*  < > 7.0*  < > 7.1* 7.3*  --  7.4* 7.2* 7.5*  MG  --   --  2.0  --  1.7  --  2.1  --  2.6*  --   --   --   PHOS  --   < > 5.7*  --  6.9*  < > 2.5 3.0 2.3 2.3 2.3 2.7  < > = values in this interval not displayed. Estimated Creatinine Clearance: 41.8 ml/min (by C-G formula based on Cr of 1.57).   LIVER  Recent Labs Lab 08/18/13 0025  08/18/13 2140 08/19/13 0320  08/20/13 0400 08/20/13 1702 08/21/13 0500 08/21/13 1105 08/21/13 1600 08/22/13 0429  AST  --   --  160* 936*  --  1052*  --   --  223*  --  166*  ALT  --   --  172* 829*  --  1491*  --   --  937*  --  750*  ALKPHOS  --   --  65 60  --  62  --   --  56  --  57  BILITOT  --   --  0.6 0.7  --  1.4*  --   --  1.3*  --  1.5*  PROT  --   --  5.0* 4.6*  --  5.2*  --   --  5.1*  --  4.9*  ALBUMIN  --   < > 2.7* 2.5*  < > 3.0* 2.8* 2.8* 2.7* 2.5* 2.7*  INR 2.48*  --   --   --   --   --   --   --   --   --   --   < > = values in this interval not displayed.   INFECTIOUS  Recent Labs Lab 08/16/13 1953 08/18/13 2140 08/19/13 1130  LATICACIDVEN 2.05 3.1* 2.4*     ENDOCRINE CBG (last 3)   Recent Labs  08/21/13 2355 08/22/13 0353 08/22/13 0744  GLUCAP 139* 134* 132*    IMAGING x48h  Dg Chest Port 1 View  08/22/2013  CLINICAL DATA:  Endotracheal tube position.  EXAM: PORTABLE CHEST - 1 VIEW  COMPARISON:  DG CHEST 1V PORT dated 08/20/2013; DG CHEST 1V PORT dated 08/19/2013  FINDINGS: 0556 hr. The endotracheal tube tip is in the midtrachea, 3.6 cm above the carina. The nasogastric tube and bilateral central lines are unchanged. The heart size and mediastinal contours are stable. Bibasilar atelectasis  and probable bilateral pleural effusions are unchanged. There is no pneumothorax.  IMPRESSION: Support system position as above. No significant change in appearance of the mediastinum or lungs.   Electronically Signed   By: Camie Patience M.D.   On: 08/22/2013 07:13    ASSESSMENT / PLAN:  PULMONARY A:  Acute on chronic respiratory failure AECOPD Hx of lung cancer s/p resection 2009 Increased mediatinal LAN, nonspecific   P:   -pcxr improved overall -abg now as on rate 24, to ensure not alkalotic -attempted SBT, cpap5 ps 5, failed, increase PS further  CARDIOVASCULAR A:  Nonspecific chest pain PRWP on EKG New onset AFRVR Peripheral vascular disease P: - Amio off, restart if rvr noted. - Neo to map goals, max at 200, vaso. OFF PRESSORS 2/1. - Stress dose steroids, as remains off pressors, reduce  - Tsh (1.998).   Intake/Output Summary (Last 24 hours) at 08/22/13 0920 Last data filed at 08/22/13 0900  Gross per 24 hour  Intake 1353.65 ml  Output    865 ml  Net 488.65 ml   RENAL Lab Results  Component Value Date   CREATININE 1.57* 08/22/2013   CREATININE 1.35 08/21/2013   CREATININE 1.00 08/21/2013    Recent Labs Lab 08/21/13 0500 08/21/13 1600 08/22/13 0429  K 4.4 4.5 4.3   A: New renal failure, no hydro on CT, ATN likely, AG acidosis ( uremia, lactic acid) P:   - CRT dc'd 2/1 -Renal following -consider neg balance, lasix consideration  GASTROINTESTINAL A:  Chronic PPI use, shock liver likely, unlikley abdo ischemia P:   SUP: Cont home dose of ppi Acute hep panel - neg Continue to monitor lft   ast 936->1052> 186 Bilirubin 0.7->1.4->1.5 Alt 829->1491> 750 Consider start T F  HEMATOLOGIC A:  Thrombocytopenia (sepsis), unknown chronicity P: hep iv for fib Plat repeat  113->90->69 > 85 Continue hep for a.fib cb cin am   INFECTIOUS A:   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 Continues to be in A.fib., weaning failed, consider neg balance   Renee A Kuneff, DO [phone]  Ccm time 30 min   I have fully examined this patient and agree with above findings.    And edited in full  [name]. Titus Mould, MD, [place] Pgr: [place]

## 2013-08-22 NOTE — Progress Notes (Signed)
ANTICOAGULATION CONSULT NOTE - Follow Up Consult  Pharmacy Consult for Heparin Indication: atrial fibrillation  Allergies  Allergen Reactions  . Lactose Intolerance (Gi)   . Shellfish Allergy Diarrhea and Nausea And Vomiting  . Strawberry Rash    Patient Measurements: Height: 5\' 9"  (175.3 cm) Weight: 155 lb 6.8 oz (70.5 kg) IBW/kg (Calculated) : 70.7 Heparin Dosing Weight: 72 kg  Vital Signs: Temp: 98.1 F (36.7 C) (02/02 1100) Temp src: Core (Comment) (02/02 0800) BP: 130/79 mmHg (02/02 1102) Pulse Rate: 125 (02/02 1102)  Labs:  Recent Labs  08/20/13 0400  08/21/13 0425 08/21/13 0500 08/21/13 1300 08/21/13 1600 08/21/13 2300 08/22/13 0429  HGB 12.4*  --  12.1*  --   --   --   --  11.5*  HCT 37.4*  --  35.9*  --   --   --   --  34.2*  PLT 90*  --  69*  --   --   --   --  85*  HEPARINUNFRC 0.64  --  0.20*  --  0.29*  --  0.17* 0.24*  CREATININE 1.27  < >  --  1.00  --  1.35  --  1.57*  < > = values in this interval not displayed.  Estimated Creatinine Clearance: 41.8 ml/min (by C-G formula based on Cr of 1.57).   Medications:  Heparin @ 950 units/hr  Assessment: 72 YOM on IV heparin for new onset atrial fibrillation. Heparin level this am is slightly sub-therapeutic at 0.24 but has accumulated some after CRRT was stopped. Will continue to monitor closely and increase slightly to get in goal range. H/H low but stable. No bleeding noted. Platelets up to 85 (improving slowly- low on admit).   Goal of Therapy:  Heparin level 0.3-0.7 units/ml Monitor platelets by anticoagulation protocol: Yes   Plan:  Increase heparin to 1050 units/hr.  Heparin level in 8 hrs to confirm. Daily heparin level and CBC.   Sloan Leiter, PharmD, BCPS Clinical Pharmacist [phone]  08/22/2013,12:37 PM

## 2013-08-23 ENCOUNTER — Inpatient Hospital Stay (HOSPITAL_COMMUNITY): Payer: Medicare Other

## 2013-08-23 LAB — COMPREHENSIVE METABOLIC PANEL
ALBUMIN: 2.4 g/dL — AB (ref 3.5–5.2)
ALT: 495 U/L — AB (ref 0–53)
AST: 99 U/L — AB (ref 0–37)
Alkaline Phosphatase: 49 U/L (ref 39–117)
BUN: 43 mg/dL — ABNORMAL HIGH (ref 6–23)
CO2: 23 meq/L (ref 19–32)
Calcium: 7.5 mg/dL — ABNORMAL LOW (ref 8.4–10.5)
Chloride: 101 mEq/L (ref 96–112)
Creatinine, Ser: 1.69 mg/dL — ABNORMAL HIGH (ref 0.50–1.35)
GFR calc Af Amer: 45 mL/min — ABNORMAL LOW (ref >90)
GFR calc non Af Amer: 38 mL/min — ABNORMAL LOW (ref >90)
Glucose, Bld: 172 mg/dL — ABNORMAL HIGH (ref 70–99)
Potassium: 4.2 mEq/L (ref 3.7–5.3)
SODIUM: 137 meq/L (ref 137–147)
Total Bilirubin: 1.2 mg/dL (ref 0.3–1.2)
Total Protein: 4.3 g/dL — ABNORMAL LOW (ref 6.0–8.3)

## 2013-08-23 LAB — CBC
HEMATOCRIT: 26.3 % — AB (ref 39.0–52.0)
HEMOGLOBIN: 8.9 g/dL — AB (ref 13.0–17.0)
MCH: 30.4 pg (ref 26.0–34.0)
MCHC: 33.8 g/dL (ref 30.0–36.0)
MCV: 89.8 fL (ref 78.0–100.0)
Platelets: 88 10*3/uL — ABNORMAL LOW (ref 150–400)
RBC: 2.93 MIL/uL — ABNORMAL LOW (ref 4.22–5.81)
RDW: 15.3 % (ref 11.5–15.5)
WBC: 20.8 10*3/uL — ABNORMAL HIGH (ref 4.0–10.5)

## 2013-08-23 LAB — HEPATIC FUNCTION PANEL
ALK PHOS: 59 U/L (ref 39–117)
ALT: 382 U/L — AB (ref 0–53)
AST: 85 U/L — AB (ref 0–37)
Albumin: 1.8 g/dL — ABNORMAL LOW (ref 3.5–5.2)
BILIRUBIN DIRECT: 0.4 mg/dL — AB (ref 0.0–0.3)
BILIRUBIN TOTAL: 0.9 mg/dL (ref 0.3–1.2)
Indirect Bilirubin: 0.5 mg/dL (ref 0.3–0.9)
Total Protein: 3.6 g/dL — ABNORMAL LOW (ref 6.0–8.3)

## 2013-08-23 LAB — RENAL FUNCTION PANEL
ALBUMIN: 2 g/dL — AB (ref 3.5–5.2)
BUN: 54 mg/dL — AB (ref 6–23)
CHLORIDE: 100 meq/L (ref 96–112)
CO2: 20 mEq/L (ref 19–32)
Calcium: 7.2 mg/dL — ABNORMAL LOW (ref 8.4–10.5)
Creatinine, Ser: 2.01 mg/dL — ABNORMAL HIGH (ref 0.50–1.35)
GFR calc non Af Amer: 31 mL/min — ABNORMAL LOW (ref >90)
GFR, EST AFRICAN AMERICAN: 36 mL/min — AB (ref >90)
Glucose, Bld: 177 mg/dL — ABNORMAL HIGH (ref 70–99)
POTASSIUM: 4 meq/L (ref 3.7–5.3)
Phosphorus: 4.1 mg/dL (ref 2.3–4.6)
Sodium: 138 mEq/L (ref 137–147)

## 2013-08-23 LAB — CBC WITH DIFFERENTIAL/PLATELET
BASOS PCT: 0 % (ref 0–1)
Basophils Absolute: 0 10*3/uL (ref 0.0–0.1)
EOS PCT: 0 % (ref 0–5)
Eosinophils Absolute: 0 10*3/uL (ref 0.0–0.7)
HCT: 19.7 % — ABNORMAL LOW (ref 39.0–52.0)
HEMOGLOBIN: 6.6 g/dL — AB (ref 13.0–17.0)
Lymphocytes Relative: 62 % — ABNORMAL HIGH (ref 12–46)
Lymphs Abs: 20.1 10*3/uL — ABNORMAL HIGH (ref 0.7–4.0)
MCH: 30.3 pg (ref 26.0–34.0)
MCHC: 33.5 g/dL (ref 30.0–36.0)
MCV: 90.4 fL (ref 78.0–100.0)
MONO ABS: 1.9 10*3/uL — AB (ref 0.1–1.0)
Monocytes Relative: 6 % (ref 3–12)
Neutro Abs: 10.3 10*3/uL — ABNORMAL HIGH (ref 1.7–7.7)
Neutrophils Relative %: 32 % — ABNORMAL LOW (ref 43–77)
Platelets: 76 10*3/uL — ABNORMAL LOW (ref 150–400)
RBC: 2.18 MIL/uL — AB (ref 4.22–5.81)
RDW: 15.1 % (ref 11.5–15.5)
WBC: 32.3 10*3/uL — ABNORMAL HIGH (ref 4.0–10.5)

## 2013-08-23 LAB — BASIC METABOLIC PANEL
BUN: 51 mg/dL — AB (ref 6–23)
CHLORIDE: 100 meq/L (ref 96–112)
CO2: 25 meq/L (ref 19–32)
Calcium: 7.2 mg/dL — ABNORMAL LOW (ref 8.4–10.5)
Creatinine, Ser: 2 mg/dL — ABNORMAL HIGH (ref 0.50–1.35)
GFR calc Af Amer: 36 mL/min — ABNORMAL LOW (ref >90)
GFR calc non Af Amer: 31 mL/min — ABNORMAL LOW (ref >90)
GLUCOSE: 139 mg/dL — AB (ref 70–99)
POTASSIUM: 4.2 meq/L (ref 3.7–5.3)
Sodium: 148 mEq/L — ABNORMAL HIGH (ref 137–147)

## 2013-08-23 LAB — GLUCOSE, CAPILLARY
GLUCOSE-CAPILLARY: 150 mg/dL — AB (ref 70–99)
GLUCOSE-CAPILLARY: 99 mg/dL (ref 70–99)
Glucose-Capillary: 155 mg/dL — ABNORMAL HIGH (ref 70–99)
Glucose-Capillary: 173 mg/dL — ABNORMAL HIGH (ref 70–99)
Glucose-Capillary: 173 mg/dL — ABNORMAL HIGH (ref 70–99)
Glucose-Capillary: 173 mg/dL — ABNORMAL HIGH (ref 70–99)

## 2013-08-23 LAB — POCT I-STAT 3, ART BLOOD GAS (G3+)
ACID-BASE DEFICIT: 10 mmol/L — AB (ref 0.0–2.0)
Acid-base deficit: 4 mmol/L — ABNORMAL HIGH (ref 0.0–2.0)
Bicarbonate: 16.2 mEq/L — ABNORMAL LOW (ref 20.0–24.0)
Bicarbonate: 20.8 mEq/L (ref 20.0–24.0)
O2 SAT: 100 %
O2 Saturation: 100 %
PCO2 ART: 33.3 mmHg — AB (ref 35.0–45.0)
PO2 ART: 392 mmHg — AB (ref 80.0–100.0)
PO2 ART: 222 mmHg — AB (ref 80.0–100.0)
Patient temperature: 97.9
TCO2: 17 mmol/L (ref 0–100)
TCO2: 22 mmol/L (ref 0–100)
pCO2 arterial: 37 mmHg (ref 35.0–45.0)
pH, Arterial: 7.25 — ABNORMAL LOW (ref 7.350–7.450)
pH, Arterial: 7.402 (ref 7.350–7.450)

## 2013-08-23 LAB — PROTIME-INR
INR: 2.82 — ABNORMAL HIGH (ref 0.00–1.49)
Prothrombin Time: 28.7 seconds — ABNORMAL HIGH (ref 11.6–15.2)

## 2013-08-23 LAB — LACTIC ACID, PLASMA: Lactic Acid, Venous: 10.5 mmol/L — ABNORMAL HIGH (ref 0.5–2.2)

## 2013-08-23 LAB — TROPONIN I
Troponin I: <0.3 ng/mL (ref <0.30)
Troponin I: 0.51 ng/mL (ref <0.30)

## 2013-08-23 LAB — PHOSPHORUS
PHOSPHORUS: 3.5 mg/dL (ref 2.3–4.6)
Phosphorus: 3.6 mg/dL (ref 2.3–4.6)
Phosphorus: 3.8 mg/dL (ref 2.3–4.6)

## 2013-08-23 LAB — MAGNESIUM
MAGNESIUM: 2.9 mg/dL — AB (ref 1.5–2.5)
Magnesium: 2.8 mg/dL — ABNORMAL HIGH (ref 1.5–2.5)

## 2013-08-23 LAB — HEPARIN LEVEL (UNFRACTIONATED): Heparin Unfractionated: 0.48 IU/mL (ref 0.30–0.70)

## 2013-08-23 LAB — PREPARE RBC (CROSSMATCH)

## 2013-08-23 LAB — APTT: aPTT: 29 seconds (ref 24–37)

## 2013-08-23 MED ORDER — PHENYLEPHRINE HCL 10 MG/ML IJ SOLN
30.0000 ug/min | INTRAVENOUS | Status: DC
  Administered 2013-08-23: 30 ug/min via INTRAVENOUS
  Filled 2013-08-23: qty 4

## 2013-08-23 MED ORDER — NOREPINEPHRINE BITARTRATE 1 MG/ML IJ SOLN
2.0000 ug/min | INTRAVENOUS | Status: DC
  Filled 2013-08-23: qty 16

## 2013-08-23 MED ORDER — DEXTROSE 5 % IV SOLN
2.0000 ug/min | INTRAVENOUS | Status: DC
  Filled 2013-08-23: qty 16

## 2013-08-23 MED ORDER — AMIODARONE HCL IN DEXTROSE 360-4.14 MG/200ML-% IV SOLN
30.0000 mg/h | INTRAVENOUS | Status: DC
  Filled 2013-08-23: qty 200

## 2013-08-23 MED ORDER — AMIODARONE HCL IN DEXTROSE 360-4.14 MG/200ML-% IV SOLN
60.0000 mg/h | INTRAVENOUS | Status: DC
  Administered 2013-08-23 (×2): 60 mg/h via INTRAVENOUS
  Filled 2013-08-23 (×2): qty 200

## 2013-08-23 MED ORDER — PANTOPRAZOLE SODIUM 40 MG PO PACK
40.0000 mg | PACK | Freq: Every day | ORAL | Status: DC
  Administered 2013-08-24: 40 mg
  Filled 2013-08-23 (×3): qty 20

## 2013-08-23 MED ORDER — SODIUM BICARBONATE 8.4 % IV SOLN
100.0000 meq | Freq: Once | INTRAVENOUS | Status: AC
  Administered 2013-08-23: 100 meq via INTRAVENOUS
  Filled 2013-08-23: qty 100

## 2013-08-23 MED ORDER — PHENYLEPHRINE HCL 10 MG/ML IJ SOLN
30.0000 ug/min | INTRAVENOUS | Status: DC
  Administered 2013-08-24: 200 ug/min via INTRAVENOUS
  Administered 2013-08-24: 120 ug/min via INTRAVENOUS
  Administered 2013-08-24: 200 ug/min via INTRAVENOUS
  Administered 2013-08-25: 150 ug/min via INTRAVENOUS
  Filled 2013-08-23 (×7): qty 4

## 2013-08-23 MED ORDER — AMIODARONE LOAD VIA INFUSION
150.0000 mg | Freq: Once | INTRAVENOUS | Status: AC
  Administered 2013-08-23: 150 mg via INTRAVENOUS
  Filled 2013-08-23: qty 83.34

## 2013-08-23 MED FILL — Medication: Qty: 1 | Status: AC

## 2013-08-23 NOTE — Progress Notes (Signed)
Patient ID: [name], male   DOB: Nov 14, 1940, 73 y.o.   MRN: [id number] M  With PEA arrest ~1600.  Patient remained intubated through out the day. Telemetry shows sudden bradycardia and then PEA arrest per notes. Patient received CPR and epinephrine x 2. Pulse and BP restored. Pt not hypoxic at time of event.  No ECG performed d/t to artifact. Labs show PH 7.25 and PO2 392. K 4.2. WBC 32, Hgb 6.6, plt 76. Heparin and amiodarone has been held.   Currently patient is alert and agitated, attempting to remove OTT.    VITAL SIGNS: Temp:  [95.8 F (35.4 C)-98.7 F (37.1 C)] 97.6 F (36.4 C) (02/03 1845) Pulse Rate:  [25-145] 106 (02/03 1830) Resp:  [5-48] 18 (02/03 1845) BP: (63-113)/(32-82) 111/82 mmHg (02/03 1700) SpO2:  [67 %-100 %] 96 % (02/03 1830) Arterial Line BP: (19-256)/(8-242) 102/52 mmHg (02/03 1845) FiO2 (%):  [40 %-100 %] 100 % (02/03 1830) Weight:  [157 lb 6.5 oz (71.4 kg)] 157 lb 6.5 oz (71.4 kg) (02/03 0500) HEMODYNAMICS: CVP:  [7 mmHg-9 mmHg] 7 mmHg VENTILATOR SETTINGS: Vent Mode:  [-] PRVC FiO2 (%):  [40 %-100 %] 100 % Set Rate:  [24 bmp] 24 bmp Vt Set:  [500 mL] 500 mL PEEP:  [5 cmH20] 5 cmH20 Pressure Support:  [5 cmH20] 5 cmH20 Plateau Pressure:  [17 cmH20-23 cmH20] 19 cmH20 INTAKE / OUTPUT: Intake/Output     02/02 0701 - 02/03 0700 02/03 0701 - 02/04 0700   I.V. (mL/kg) 907 (12.7) 491 (6.9)   Blood  13   NG/GT 375 120   IV Piggyback 50 50   Total Intake(mL/kg) 1332 (18.7) 674 (9.4)   Urine (mL/kg/hr) 945 (0.6) 203 (0.2)   Other     Total Output 945 203   Net +387 +471          BP 111/82  Pulse 106  Temp(Src) 97.6 F (36.4 C) (Core (Comment))  Resp 18  Ht 5\' 9"  (1.753 m)  Wt 157 lb 6.5 oz (71.4 kg)  BMI 23.23 kg/m2  SpO2 96%  PHYSICAL EXAMINATION: General:  Irritable. Attempting to pull out tube. Unable to calm.  Neuro: irritable. RAS +1 to 2 HEENT:  EOMI, PERRL OTT-Vent, OGT Cardiovascular: Irreg rate and rhythm, tachy, no M Lungs: Mild  diffuse coarse breath sounds.  Abdomen: taut, ? tenderness, distended. hypoactive BS Ext: warm, +1 edema, diminished DP pulses  LABS: PULMONARY  Recent Labs Lab 08/18/13 2105 08/19/13 1120 08/19/13 1317 08/22/13 1348 08/23/13 1617  PHART 7.119* 7.279* 7.370 7.438 7.250*  PCO2ART 42.0 51.2* 39.7 35.6 37.0  PO2ART 84.8 78.0* 70.0* 113.0* 392.0*  HCO3 13.2* 24.1* 23.2 24.1* 16.2*  TCO2 12.7 26 24 25 17   O2SAT 94.0 94.0 94.0 99.0 100.0    CBC  Recent Labs Lab 08/22/13 0429 08/23/13 0440 08/23/13 1616  HGB 11.5* 8.9* 6.6*  HCT 34.2* 26.3* 19.7*  WBC 18.2* 20.8* 32.3*  PLT 85* 88* 76*     CARDIAC    Recent Labs Lab 08/17/13 1149 08/17/13 2240 08/18/13 0527 08/18/13 2140 08/23/13 1616  TROPONINI <0.30 <0.30 <0.30 <0.30 <0.30    CHEMISTRY  Recent Labs Lab 08/20/13 0400  08/21/13 0425  08/22/13 0429 08/22/13 1337 08/22/13 1600 08/23/13 0137 08/23/13 0440 08/23/13 1300 08/23/13 1600 08/23/13 1616  NA 137  < >  --   < > 139  --  139  --  137  --  138 148*  K 4.6  < >  --   < >  4.3  --  4.3  --  4.2  --  4.0 4.2  CL 99  < >  --   < > 101  --  102  --  101  --  100 100  CO2 25  < >  --   < > 22  --  24  --  23  --  20 25  GLUCOSE 91  < >  --   < > 151*  --  129*  --  172*  --  177* 139*  BUN 21  < >  --   < > 33*  --  39*  --  43*  --  54* 51*  CREATININE 1.27  < >  --   < > 1.57*  --  1.68*  --  1.69*  --  2.01* 2.00*  CALCIUM 7.1*  < >  --   < > 7.5*  --  7.5*  --  7.5*  --  7.2* 7.2*  MG 2.1  --  2.6*  --   --  2.8*  --  2.8*  --  2.9*  --   --   PHOS 2.5  < > 2.3  < > 2.7 2.7 2.9 3.5 3.6 3.8 4.1  --   < > = values in this interval not displayed. Estimated Creatinine Clearance: 32.9 ml/min (by C-G formula based on Cr of 2).  INFECTIOUS  Recent Labs Lab 08/18/13 2140 08/19/13 1130 08/23/13 1616  LATICACIDVEN 3.1* 2.4* 10.5*    ASSESSMENT / PLAN:  PULMONARY A:  Acute on chronic respiratory failure AECOPD Hx of lung cancer s/p resection  2009 Increased mediatinal LAN, nonspecific  P:   -attempted SBT, cpap5 ps 5, failed, increase PS further -Control rate  -follow balance closely  CARDIOVASCULAR A:  Nonspecific chest pain PRWP on EKG New onset AFRVR-an issue for weaning Peripheral vascular disease P: - Amio off - Neo to map goals, max at 200, vaso.  - Continue Stress dose steroids - cycled trop - repeat abg (2100) and ekg    Intake/Output Summary (Last 24 hours) at 08/23/13 1851 Last data filed at 08/23/13 1830  Gross per 24 hour  Intake 1437.25 ml  Output    718 ml  Net 719.25 ml   RENAL Lab Results  Component Value Date   CREATININE 2.00* 08/23/2013   CREATININE 2.01* 08/23/2013   CREATININE 1.69* 08/23/2013    Recent Labs Lab 08/23/13 0440 08/23/13 1600 08/23/13 1616  K 4.2 4.0 4.2   A: New renal failure, no hydro on CT, ATN, pos balance again P:   - CRT dc'd 2/1 -Renal following -consider neg balance, lasix, will d/w renal   GASTROINTESTINAL A:  Chronic PPI use, shock liverimproving, unlikley abdo ischemia P:   SUP: Cont home dose of ppi Continue to monitor lft   ast 936->1052> 186 >99 Bilirubin 0.7->1.4->1.5-> 1.2 Alt 829->1491> 750 > 495 T F started yesterday, held until CT scan resulted.  HEMATOLOGIC A:  Thrombocytopenia (sepsis), unknown chronicity       Anemia P: hep iv held until r/o bleeding.  Hb 6.6. Pt receiving 1 unit RBC now. Plat repeat  113->90->69 > 85 > 88 Cbc after transfusion CT abd/pelvis w/o contrast r/o retroperitoneal bleed.   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 Concern for retroperitoneal bleed with dropping  Hb (6.6) and taut abd   ENDOCRINE CBG (last 3)   Recent Labs  08/23/13 0750 08/23/13 1053 08/23/13 1532  GLUCAP 150* 173* 173*   A: rel AI, mild Metabolic acidosis post code P:   CBGs  while on systemic steroids, maintain with borderline bP, if to pressors increase to 50 q6h SSI for glu > 180 tsh (1.9) ABG with mild metabolic acidosis: bicarb 1 amp after code; repeat ABG at 2100  NEUROLOGIC A:  Mild depression, Agitated P:   Restarted  fent 50 mcg, increase until comfortable and calm Bilateral soft wrist restraints needed. Will Dc as soon as clinically indicated.     Discussed pt with elink, Dr. Chase Caller.  Ma Hillock, [place]

## 2013-08-23 NOTE — Progress Notes (Signed)
Patient ID: [name], male   DOB: April 17, 1941, 73 y.o.   MRN: [id number] Date of Encounter: 08/23/2013  Principal Problem:   Acute on chronic respiratory failure Active Problems:   Acute exacerbation of chronic obstructive pulmonary disease (COPD)   History of lung cancer, s/p RUL resection 2009 @ [facility]   Mediastinal adenopathy   Smoker   Atrial fibrillation with RVR   PRWP on ECG   New onset atrial fibrillation   Chest tightness   Peripheral vascular disease   Vascular graft infection, on chronic doxycycline   Thrombocytopenia   Acute respiratory failure   Acute combined systolic and diastolic heart failure   Septic shock(785.52)   Length of Stay: 7  SUBJECTIVE  Intubated, but more awake & calm than yesterday. AF - rate currently controlled - IV Amiodarone bolus given this AM. Off pressors, mildly hypotensive CRRT stopped with plans for intermittent hemodialysis Creat is 1.0 (artifactually low post CRRT) Platelets stable  CURRENT MEDS . antiseptic oral rinse  15 mL Mouth Rinse q12n4p  . aspirin  81 mg Oral Daily  . budesonide  0.25 mg Nebulization Q6H  . cefTRIAXone (ROCEPHIN)  IV  1 g Intravenous Daily  . chlorhexidine  15 mL Mouth Rinse BID  . clopidogrel  75 mg Oral Q breakfast  . hydrocortisone sod succinate (SOLU-CORTEF) inj  25 mg Intravenous Q6H  . insulin aspart  0-9 Units Subcutaneous Q4H  . ipratropium  0.5 mg Nebulization Q6H  . levalbuterol  0.63 mg Nebulization Q6H  . metoprolol tartrate  12.5 mg Oral Q6H  . pantoprazole sodium  40 mg Per Tube QHS    OBJECTIVE  Filed Vitals:   08/23/13 1230 08/23/13 1245 08/23/13 1300 08/23/13 1315  BP:   63/32   Pulse: 88 51 83 82  Temp: 98.3 F (36.8 C) 98.3 F (36.8 C) 98.3 F (36.8 C) 98.3 F (36.8 C)  TempSrc:      Resp: 24 16 23 24   Height:      Weight:      SpO2: 100% 100% 100% 100%    Intake/Output Summary (Last 24 hours) at 08/23/13 1359 Last data filed at 08/23/13 1300  Gross per 24 hour    Intake 1488.06 ml  Output    889 ml  Net 599.06 ml   Filed Weights   08/21/13 0500 08/22/13 0500 08/23/13 0500  Weight: 158 lb 1.1 oz (71.7 kg) 155 lb 6.8 oz (70.5 kg) 157 lb 6.5 oz (71.4 kg)    PHYSICAL EXAM  General: Pleasant, NAD.  Calm Neuro: A&O. Moves all extremities spontaneously. Seems quite agitated after weaning protocol HEENT:  Normal  Neck: Supple without bruits or JVD. Lungs:  Resp regular and unlabored, CTA. Heart: Irreg/Irreg; unable to auscultate M./R[facility]G. Center due to tachycardic rate and Abdomen: Soft, non-tender, non-distended, BS + x 4.  Extremities: No clubbing, cyanosis or edema. DP/PT/Radials 2+ and equal bilaterally.  Accessory Clinical Findings  CBC  Recent Labs  08/21/13 0425 08/22/13 0429 08/23/13 0440  WBC 18.6* 18.2* 20.8*  NEUTROABS 10.9*  --   --   HGB 12.1* 11.5* 8.9*  HCT 35.9* 34.2* 26.3*  MCV 91.3 89.8 89.8  PLT 69* 85* 88*   Basic Metabolic Panel  Recent Labs  08/22/13 1600 08/23/13 0137 08/23/13 0440 08/23/13 1300  NA 139  --  137  --   K 4.3  --  4.2  --   CL 102  --  101  --   CO2 24  --  23  --   GLUCOSE 129*  --  172*  --   BUN 39*  --  43*  --   CREATININE 1.68*  --  1.69*  --   CALCIUM 7.5*  --  7.5*  --   MG  --  2.8*  --  2.9*  PHOS 2.9 3.5 3.6 3.8   Liver Function Tests  Recent Labs  08/22/13 0429 08/22/13 1600 08/23/13 0440  AST 166*  --  99*  ALT 750*  --  495*  ALKPHOS 57  --  49  BILITOT 1.5*  --  1.2  PROT 4.9*  --  4.3*  ALBUMIN 2.7* 2.3* 2.4*   Radiology/Studies Chest x-ray December 2(personally reviewed): ET tube in place. Bibasilar atelectasis with probable bilateral effusions. No PTX -- stable Echocardiogram in January 28:EF 15-20% with a likely grade 3 diastolic dysfunction. Moderate MR.  TELE AFib, mild CVR  ASSESSMENT AND PLAN Afib - borderline BPs with BB, but still with labile BP --> now that LFTs are improved, Amiodarone is reasonable.  With A. Fib, will need long-term  anticoagulations, if no invasive procedures are planned, start warfarin so we can discontinue heparin. But would continue heparin until INR therapeutic.  If he were to decompensate, overall standpoint hemodynamically, would consider TEE guided DCCV.  Leonie Man, M.D., M.S. [facility] [address] [place], [place]  [address]  [phone] Pager # [phone] 08/23/2013 1:59 PM

## 2013-08-23 NOTE — Progress Notes (Signed)
Admit: 08/16/2013 LOS: 7  52M w/ VDRF in setting of COPD, systolic HF, septic shock with shock liver, AKI 2/2 ATN requiring CRRT (08/19/13-08/21/13).    Subjective:  Good UOP SCr stable over past 24h  Remains in RVR Unable to ween from vent  02/02 0701 - 02/03 0700 In: 1296.5 [I.V.:871.5; NG/GT:375; IV Piggyback:50] Out: 945 [Urine:945]  Filed Weights   08/21/13 0500 08/22/13 0500 08/23/13 0500  Weight: 71.7 kg (158 lb 1.1 oz) 70.5 kg (155 lb 6.8 oz) 71.4 kg (157 lb 6.5 oz)    Current meds: reviewed  Current Labs: reviewed    Physical Exam:  Blood pressure 83/53, pulse 39, temperature 98.5 F (36.9 C), temperature source Core (Comment), resp. rate 24, height 5\' 9"  (1.753 m), weight 71.4 kg (157 lb 6.5 oz), SpO2 100.00%. Intubated ,sedated, eyes open to stimuli IRIR, tachycardic Coarse bs b/l No rashes/lesions No LEE Foley in place R IJ HD cath noted  Assessment/Plan 1. AKI 2/2 ATN: prev req CRRT but now off and holdign steady GFR, ok fluid status, and K/HCO3 stable.   Good UOP.  Cont to follow for now.  Ok with trial of furosemide as clinically indicated.   Pearson Grippe MD 08/23/2013, 7:58 AM   Recent Labs Lab 08/22/13 0429  08/22/13 1600 08/23/13 0137 08/23/13 0440  NA 139  --  139  --  137  K 4.3  --  4.3  --  4.2  CL 101  --  102  --  101  CO2 22  --  24  --  23  GLUCOSE 151*  --  129*  --  172*  BUN 33*  --  39*  --  43*  CREATININE 1.57*  --  1.68*  --  1.69*  CALCIUM 7.5*  --  7.5*  --  7.5*  PHOS 2.7  < > 2.9 3.5 3.6  < > = values in this interval not displayed.  Recent Labs Lab 08/19/13 0320 08/20/13 0400 08/21/13 0425 08/22/13 0429 08/23/13 0440  WBC 29.1* 25.2* 18.6* 18.2* 20.8*  NEUTROABS 16.9* 15.3* 10.9*  --   --   HGB 12.5* 12.4* 12.1* 11.5* 8.9*  HCT 38.9* 37.4* 35.9* 34.2* 26.3*  MCV 95.6 92.1 91.3 89.8 89.8  PLT 113* 90* 69* 85* 88*

## 2013-08-23 NOTE — Progress Notes (Signed)
Arrived into patient's room at 1601, noted hr of 26 on monitor, patient pale in color, unresponsive, unable to palpate a femoral or carotid pulse. Code Blue called, chest compressions started, code team arrived, Dr. Chase Caller on phone from [facility] given orders and he stayed on phone for the entire code. Epinephrine x  2 amps given during code. ROSC after 6 minutes. 1 amp bicarb post code. Chest x-ray done post code, Hb 6.6 and called to Dr. Chase Caller. Orders given to type & cross & transfuse; see orders. Family was called and wife arrived at bedside at 1650, updated and Cardiologist also spoke to her. [place], Lauralyn Primes, RN

## 2013-08-23 NOTE — Progress Notes (Signed)
Spoke with wife to give her results of CT and updates on plan and need for pressure supports and blood products. Wife asked appropriate questions, which were answered. She voiced understanding of plan and pt status. She would like to be updated immediately if he decompensates over night. She will attempt to be here early morning for rounds. She was appreciative of update.   Howard Pouch DO PGY-2

## 2013-08-23 NOTE — Progress Notes (Signed)
[facility] staffing  Dr. Brand Males, M.D., [facility].C.P Pulmonary and Critical Care Medicine Staff Physician [facility] Pager: [phone], If no answer or between  15:00h - 7:00h: call [phone]  08/23/2013 8:18 PM

## 2013-08-23 NOTE — Op Note (Addendum)
PATIENT NAME: [name] MEDICAL RECORD NUMBER: [id number] Birthday: [birth year]  Age: 73 y.o. Admit Date: 08/16/2013  Provider: Chase Caller from [facility]  Indication: PEA arrest while on heparin IV and amio IV  Technical Description:   CPR performance duration: 6 min  Was defibrillation or cardioversion used ? no  Was external pacer placed ? no  Was patient intubated pre/post CPR ? Intubate pre cpr, patient in icu on vent  Was transvenous pacer placed ? no  Medications Administered Include      Yes/no Amiodarone Yes, drip pre-cpr  Atropin n  Calcium n  Epinephrine X 2 yes  Lidocaine n  Magnesium n  Norepinephrine n  Phenylephrine n  Sodium bicarbonate 1 x yes post cpr  Vasopression no   Evaluation  Final Status - Was patient successfully resuscitated ? YES  If successfully resuscitated - what is current rhythm ? A Fib with HR 137 If successfully resuscitated - what is current hemodynamic status ? Stab;e  Miscellaneous Information Check labs, cxr Cardiology Dr Ellyn Hack informed - he will have doc of day see  [facility] 2/3/20154:13 PM

## 2013-08-23 NOTE — Progress Notes (Addendum)
ANTICOAGULATION CONSULT NOTE - Follow Up Consult  Pharmacy Consult for Heparin Indication: atrial fibrillation  Allergies  Allergen Reactions  . Lactose Intolerance (Gi)   . Shellfish Allergy Diarrhea and Nausea And Vomiting  . Strawberry Rash    Patient Measurements: Height: 5\' 9"  (175.3 cm) Weight: 157 lb 6.5 oz (71.4 kg) IBW/kg (Calculated) : 70.7 Heparin Dosing Weight: 72 kg  Vital Signs: Temp: 98.5 F (36.9 C) (02/03 0645) Temp src: Core (Comment) (02/03 0400) BP: 83/53 mmHg (02/03 0600) Pulse Rate: 39 (02/03 0645)  Labs:  Recent Labs  08/21/13 0425  08/22/13 0429 08/22/13 1600 08/22/13 2227 08/23/13 0440  HGB 12.1*  --  11.5*  --   --  8.9*  HCT 35.9*  --  34.2*  --   --  26.3*  PLT 69*  --  85*  --   --  88*  HEPARINUNFRC 0.20*  < > 0.24*  --  0.37 0.48  CREATININE  --   < > 1.57* 1.68*  --  1.69*  < > = values in this interval not displayed.  Estimated Creatinine Clearance: 38.9 ml/min (by C-G formula based on Cr of 1.69).   Medications:  Heparin @ 1050 units/hr  Assessment: [name] on IV heparin for new onset atrial fibrillation. Heparin level is therapeutic.  H/H is trending down. No bleeding noted. Platelets up to 88(improving slowly- low on admit at 100).   Goal of Therapy:  Heparin level 0.3-0.7 units/ml Monitor platelets by anticoagulation protocol: Yes   Plan:  Continue heparin to 1050 units/hr.  Daily heparin level and CBC.   Sloan Leiter, PharmD, BCPS Clinical Pharmacist [phone] 08/23/2013,7:54 AM

## 2013-08-23 NOTE — Progress Notes (Signed)
CTSP following PEA arrest; patient intubated and cannot provide history; review of telemetry shows sudden bradycardia and then PEA arrest per notes. Patient received CPR and epinephrine x 2. Pulse and BP restored. No ECG performed. Labs show PH 7.25 and PO2 392. K 4.2. WBC 32, Hgb 6.6, plt 76. No clear blood loss on exam. Would hold heparin until clear pt not bleeding. Transfuse as you are. Hold amiodarone for now and follow. Note, patient's nurse states he was not hypoxemic at time of arrest.  Kirk Ruths

## 2013-08-23 NOTE — Progress Notes (Signed)
CRITICAL VALUE ALERT  Critical value received:  Troponin 0.51  Date of notification:  08/23/2013  Time of notification:  2328  Critical value read back:yes  Nurse who received alert:  Allegra Grana, RN  MD notified (1st page):  Dr Raoul Pitch  Time of first page:  2328  MD notified (2nd page):  Time of second page:  Responding MD:  Dr Raoul Pitch  Time MD responded:  2329

## 2013-08-23 NOTE — Progress Notes (Addendum)
Radiology called with results of CT abd/pelvis showing  Blood pool within the heart which is  relatively hypodense, suggesting anemia. Biliary ductal dilatation. The left kidney is displaced anteriorly.  No evidence of bowel obstruction.   A large heterogeneous collection with main component measuring approximately 9.4 x 7.9 x 14.1 cm is present within the left retroperitoneum. Finding is consistent with retroperitoneal hematoma. Heterogeneous hyperdensity is seen within the adjacent left psoas muscle which is thickened and enlarged, also compatible with hemorrhage. This collection extends inferiorly into the left hemipelvis with probable blood seen within the presacral space. Hemorrhage is seen along the left pericolic gutter as well. The left kidney and spleen are displaced anteriorly with anterior and medial displacement of the left adrenal gland. Extensive aorto bi-iliac atherosclerotic calcifications again noted. Vascular stent within the celiac axis and SMA are unchanged.   Pt is now needing 110 mcg of neo to sustain BP.  BP 100/84  Pulse 78  Temp(Src) 97.7 F (36.5 C) (Core (Comment))  Resp 24  Ht 5\' 9"  (1.753 m)  Wt 157 lb 6.5 oz (71.4 kg)  BMI 23.23 kg/m2  SpO2 99%   A/P:  - Discontinue plavix - CBC pending, concern for active bleed.  - continue Neo for pressure support.  - 1 unit of platelets now.   I discussed results and plan  with [facility], Dr. Tamala Bari, [place] PGY-2

## 2013-08-23 NOTE — Progress Notes (Signed)
Patient ID: TAIJON KLOSTER, male   DOB: 1941-02-15, 73 y.o.   MRN: [id number]    Name: DONTRELLE SIEKER MRN: [id number] DOB: 1941-04-21    ADMISSION DATE:  08/16/2013  REFERRING MD :  EDP PRIMARY SERVICE: PCCM  CHIEF COMPLAINT:  Dyspnea  BRIEF PATIENT DESCRIPTION:   44 M with COPD, hx of lung ca, PVD, current smoker presented to [facility] ED with 2 wks of progressive dyspnea and chest tightness minimally and transiently responsive to albuterol MDI. Developed afib rvr, abdominal process,  resp failure, ETT placed and renal failure, sepsis.  LINES / TUBES: 1/28 OTT>> 1/28 l i j cvl>> Rt fem aline 1/29>>> 1/29 rt ij hd>>>  CULTURES: Blood 1/27 >> neg resp virus 1/28 >> Neg Flu pcr 1/28 >> NEG 1/29 sputum>> Normal flora 1/30 uc>>neg  ANTIBIOTICS: Doxycycline (chronic)>>> Held 1/29 tamiflu 1/28 (ongoing till resp virus multiplex pcr results return) >> 1/29 1/29 vanc>>>2/2 1/29 zosyn>>>2/2 2/2 ceftriaxone >>>stop 2/5  SIGNIFICANT EVENTS / STUDIES:  1/27 CT chest: Negative for PE.  Postop changes right upper lobectomy  Interval increased mediastinal adenopathy. Largest lymph nodes in the left prevascular/ AP window space and the right paratracheal region.  Small chronic residual right subpulmonic effusion with small loculated components and pleural thickening all appearing chronic.  Centrilobular emphysema.   1/28 intubated 1/29 Transferred to [facility] 1/29 bronch BAL 1/30 cvvhd 1/30 hep panel and flu panel neg 2/1 planned to extubate> failed 2/1 off pressors 2/2 CXR unchanged  SUBJECTIVE/OVERNIGHT/INTERVAL HX- some borderline BP, rate improved, but increases intermittent  VITAL SIGNS: Temp:  [98.1 F (36.7 C)-98.7 F (37.1 C)] 98.5 F (36.9 C) (02/03 0645) Pulse Rate:  [25-145] 39 (02/03 0645) Resp:  [5-24] 24 (02/03 0645) BP: (74-140)/(38-79) 83/53 mmHg (02/03 0600) SpO2:  [97 %-100 %] 100 % (02/03 0645) Arterial Line BP: (88-256)/(41-242) 94/47 mmHg (02/03 0645) FiO2 (%):  [40 %]  40 % (02/03 0331) Weight:  [157 lb 6.5 oz (71.4 kg)] 157 lb 6.5 oz (71.4 kg) (02/03 0500) HEMODYNAMICS: CVP:  [8 mmHg-12 mmHg] 8 mmHg VENTILATOR SETTINGS: Vent Mode:  [-] PRVC FiO2 (%):  [40 %] 40 % Set Rate:  [24 bmp] 24 bmp Vt Set:  [500 mL] 500 mL PEEP:  [5 cmH20] 5 cmH20 Plateau Pressure:  [20 cmH20-23 cmH20] 23 cmH20 INTAKE / OUTPUT: Intake/Output     02/02 0701 - 02/03 0700 02/03 0701 - 02/04 0700   I.V. (mL/kg) 871.5 (12.2)    NG/GT 375    IV Piggyback 50    Total Intake(mL/kg) 1296.5 (18.2)    Urine (mL/kg/hr) 945 (0.6)    Other     Total Output 945     Net +351.5            PHYSICAL EXAMINATION: General:  Sedated on vent Neuro:  Sedated.   awakens to name, makes eye contact HEENT:  EOMI, PERRL OTT-Vent, OGT Cardiovascular: Irreg rate and rhythm, tachy, no M Lungs: Mild diffuse coarse breath sounds.  Abdomen: firm, ? tenderness,  Mildly distended. hypoactive BS Ext: warm, trace edema, diminished DP pulses  LABS: PULMONARY  Recent Labs Lab 08/18/13 0515 08/18/13 2105 08/19/13 1120 08/19/13 1317 08/22/13 1348  PHART 7.206* 7.119* 7.279* 7.370 7.438  PCO2ART 42.3 42.0 51.2* 39.7 35.6  PO2ART 101.0* 84.8 78.0* 70.0* 113.0*  HCO3 16.1* 13.2* 24.1* 23.2 24.1*  TCO2 14.9 12.7 26 24 25   O2SAT 96.0 94.0 94.0 94.0 99.0    CBC  Recent Labs Lab 08/21/13 0425 08/22/13 0429 08/23/13  0440  HGB 12.1* 11.5* 8.9*  HCT 35.9* 34.2* 26.3*  WBC 18.6* 18.2* 20.8*  PLT 69* 85* 88*    COAGULATION  Recent Labs Lab 08/18/13 0025  INR 2.48*    CARDIAC    Recent Labs Lab 08/17/13 0305 08/17/13 1149 08/17/13 2240 08/18/13 0527 08/18/13 2140  TROPONINI <0.30 <0.30 <0.30 <0.30 <0.30    Recent Labs Lab 08/17/13 0305 08/19/13 0320  PROBNP 2840.0* 22750.0*     CHEMISTRY  Recent Labs Lab 08/19/13 0320  08/20/13 0400  08/21/13 0425 08/21/13 0500 08/21/13 1600 08/22/13 0429 08/22/13 1337 08/22/13 1600 08/23/13 0137 08/23/13 0440  NA 135*   < > 137  < >  --  137 137 139  --  139  --  137  K 4.4  < > 4.6  < >  --  4.4 4.5 4.3  --  4.3  --  4.2  CL 100  < > 99  < >  --  100 102 101  --  102  --  101  CO2 18*  < > 25  < >  --  23 22 22   --  24  --  23  GLUCOSE 209*  < > 91  < >  --  121* 127* 151*  --  129*  --  172*  BUN 39*  < > 21  < >  --  20 27* 33*  --  39*  --  43*  CREATININE 2.61*  < > 1.27  < >  --  1.00 1.35 1.57*  --  1.68*  --  1.69*  CALCIUM 7.0*  < > 7.1*  < >  --  7.4* 7.2* 7.5*  --  7.5*  --  7.5*  MG 1.7  --  2.1  --  2.6*  --   --   --  2.8*  --  2.8*  --   PHOS 6.9*  < > 2.5  < > 2.3 2.3 2.3 2.7 2.7 2.9 3.5 3.6  < > = values in this interval not displayed. Estimated Creatinine Clearance: 38.9 ml/min (by C-G formula based on Cr of 1.69).   LIVER  Recent Labs Lab 08/18/13 0025  08/19/13 0320  08/20/13 0400  08/21/13 1105 08/21/13 1600 08/22/13 0429 08/22/13 1600 08/23/13 0440  AST  --   < > 936*  --  1052*  --  223*  --  166*  --  99*  ALT  --   < > 829*  --  1491*  --  937*  --  750*  --  495*  ALKPHOS  --   < > 60  --  62  --  56  --  57  --  49  BILITOT  --   < > 0.7  --  1.4*  --  1.3*  --  1.5*  --  1.2  PROT  --   < > 4.6*  --  5.2*  --  5.1*  --  4.9*  --  4.3*  ALBUMIN  --   < > 2.5*  < > 3.0*  < > 2.7* 2.5* 2.7* 2.3* 2.4*  INR 2.48*  --   --   --   --   --   --   --   --   --   --   < > = values in this interval not displayed.   INFECTIOUS  Recent Labs Lab 08/16/13 1953 08/18/13 2140 08/19/13 1130  LATICACIDVEN 2.05 3.1*  2.4*     ENDOCRINE CBG (last 3)   Recent Labs  08/22/13 1904 08/23/13 0004 08/23/13 0415  GLUCAP 112* 173* 155*    IMAGING x48h  Dg Chest Port 1 View  08/22/2013   CLINICAL DATA:  Endotracheal tube position.  EXAM: PORTABLE CHEST - 1 VIEW  COMPARISON:  DG CHEST 1V PORT dated 08/20/2013; DG CHEST 1V PORT dated 08/19/2013  FINDINGS: 0556 hr. The endotracheal tube tip is in the midtrachea, 3.6 cm above the carina. The nasogastric tube and bilateral central  lines are unchanged. The heart size and mediastinal contours are stable. Bibasilar atelectasis and probable bilateral pleural effusions are unchanged. There is no pneumothorax.  IMPRESSION: Support system position as above. No significant change in appearance of the mediastinum or lungs.   Electronically Signed   By: Roxy Horseman M.D.   On: 08/22/2013 07:13    ASSESSMENT / PLAN:  PULMONARY A:  Acute on chronic respiratory failure AECOPD Hx of lung cancer s/p resection 2009 Increased mediatinal LAN, nonspecific  P:   -pcxr improved overall, repeat this am with failed weaning day prior -attempted SBT, cpap5 ps 5, failed, increase PS further -Control rate more effective -follow balance closely  CARDIOVASCULAR A:  Nonspecific chest pain PRWP on EKG New onset AFRVR-an issue for weaning Peripheral vascular disease P: - Amio off, will d/w cards to restart with lower MAP after beta blocker - Neo to map goals, max at 200, vaso.  - Continue Stress dose steroids - Tsh (1.998).    Intake/Output Summary (Last 24 hours) at 08/23/13 0736 Last data filed at 08/23/13 0600  Gross per 24 hour  Intake 1296.54 ml  Output    945 ml  Net 351.54 ml   RENAL Lab Results  Component Value Date   CREATININE 1.69* 08/23/2013   CREATININE 1.68* 08/22/2013   CREATININE 1.57* 08/22/2013    Recent Labs Lab 08/22/13 0429 08/22/13 1600 08/23/13 0440  K 4.3 4.3 4.2   A: New renal failure, no hydro on CT, ATN, pos balance again P:   - CRT dc'd 2/1 -Renal following -consider neg balance, lasix, will d/w renal   GASTROINTESTINAL A:  Chronic PPI use, shock liverimproving, unlikley abdo ischemia P:   SUP: Cont home dose of ppi Continue to monitor lft   ast 936->1052> 186 >99 Bilirubin 0.7->1.4->1.5-> 1.2 Alt 829->1491> 750 > 495 T F started yesterday  HEMATOLOGIC A:  Thrombocytopenia (sepsis), unknown chronicity       Anemia P: hep iv for fib Plat repeat  113->90->69 > 85 > 88 Continue hep  for a.fib, will consider start of coumadin for long term therapy when Hb is stable and no trach needed Cbc in am. 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 Might flu like  resp virus (flu pcr negative)  P:   ceftriaxone Follow abdo examination  ENDOCRINE CBG (last 3)   Recent Labs  08/22/13 1904 08/23/13 0004 08/23/13 0415  GLUCAP 112* 173* 155*   A: rel AI P:   CBGs while on systemic steroids, maintain with borderline bP, if to pressors increase to 50 q6h SSI for glu > 180 tsh (1.9)  NEUROLOGIC A:  Mild depression P:   Sedated on vent on fent 50 mcg WUA Chair position  TODAY'S SUMMARY:  2/3--wean, hold coumadin, add back amio, control rate, consider lasix   Renee A Kuneff, DO [phone]  Ccm time 30 min  I have fully examined this patient and agree with above findings.    And edited in full  [name]. Titus Mould, MD, [place] Pgr: [facility]

## 2013-08-23 NOTE — Code Documentation (Signed)
CODE BLUE NOTE  Patient Name: [name]   MRN: [id number]   Date of Birth/ Sex: [birth year] , male      Admission Date: 08/16/2013  Attending Provider: Wilhelmina Mcardle, MD  Primary Diagnosis: Acute on chronic respiratory failure    Indication: Pt was in his usual state of health (nurse says he was planned to be extubated today but had slow respiratory rate) until this PM, when he was noted to be in PEA. Code blue was subsequently called. At the time of arrival on scene, ACLS protocol was underway.     Technical Description:  - CPR performance duration:  6  minutes  - Was defibrillation or cardioversion used? No   - Was external pacer placed? No  - Was patient intubated pre CPR? Yes    Medications Administered: Y = Yes; Blank = No Amiodarone    Atropine    Calcium    Epinephrine  2  Lidocaine    Magnesium    Norepinephrine    Phenylephrine    Sodium bicarbonate    Vasopressin      Post CPR evaluation:  - Final Status - Was patient successfully resuscitated ? Yes - What is current rhythm? Sinus tach - What is current hemodynamic status? stable   Miscellaneous Information:  - Labs sent, including: CMP, CBC, mag, ABG  - Primary team notified?  Yes  - Family Notified? No  - Additional notes/ transfer status:  patient will remain in ICU        Tawanna Sat, MD  08/23/2013, 4:13 PM

## 2013-08-23 NOTE — Progress Notes (Signed)
  Amiodarone Drug - Drug Interaction Consult Note  Recommendations: No interactions identified at current time. However, possibility of Coumadin in this patient. If Coumadin is started, close monitoring of INR is needed. Recommend Coumadin per pharmacy protocol for close monitoring and dose adjustments.   Amiodarone is metabolized by the cytochrome P450 system and therefore has the potential to cause many drug interactions. Amiodarone has an average plasma half-life of 50 days (range 20 to 100 days).   There is potential for drug interactions to occur several weeks or months after stopping treatment and the onset of drug interactions may be slow after initiating amiodarone.   []  Statins: Increased risk of myopathy. Simvastatin- restrict dose to 20mg  daily. Other statins: counsel patients to report any muscle pain or weakness immediately.  []  Anticoagulants: Amiodarone can increase anticoagulant effect. Consider warfarin dose reduction. Patients should be monitored closely and the dose of anticoagulant altered accordingly, remembering that amiodarone levels take several weeks to stabilize.  --**May be a consideration in near future. Currently Coumadin held.   []  Antiepileptics: Amiodarone can increase plasma concentration of phenytoin, the dose should be reduced. Note that small changes in phenytoin dose can result in large changes in levels. Monitor patient and counsel on signs of toxicity.  []  Beta blockers: increased risk of bradycardia, AV block and myocardial depression. Sotalol - avoid concomitant use.  []   Calcium channel blockers (diltiazem and verapamil): increased risk of bradycardia, AV block and myocardial depression.  []   Cyclosporine: Amiodarone increases levels of cyclosporine. Reduced dose of cyclosporine is recommended.  []  Digoxin dose should be halved when amiodarone is started.  []  Diuretics: increased risk of cardiotoxicity if hypokalemia occurs.  []  Oral hypoglycemic  agents (glyburide, glipizide, glimepiride): increased risk of hypoglycemia. Patient's glucose levels should be monitored closely when initiating amiodarone therapy.   []  Drugs that prolong the QT interval:  Torsades de pointes risk may be increased with concurrent use - avoid if possible.  Monitor QTc, also keep magnesium/potassium WNL if concurrent therapy can't be avoided. Marland Kitchen Antibiotics: e.g. fluoroquinolones, erythromycin. . Antiarrhythmics: e.g. quinidine, procainamide, disopyramide, sotalol. . Antipsychotics: e.g. phenothiazines, haloperidol.  . Lithium, tricyclic antidepressants, and methadone.  Thank You,  Brain Hilts  08/23/2013 10:24 AM

## 2013-08-24 DIAGNOSIS — R58 Hemorrhage, not elsewhere classified: Secondary | ICD-10-CM

## 2013-08-24 LAB — COMPREHENSIVE METABOLIC PANEL
ALK PHOS: 66 U/L (ref 39–117)
ALT: 574 U/L — ABNORMAL HIGH (ref 0–53)
AST: 419 U/L — ABNORMAL HIGH (ref 0–37)
Albumin: 2.3 g/dL — ABNORMAL LOW (ref 3.5–5.2)
BUN: 62 mg/dL — ABNORMAL HIGH (ref 6–23)
CO2: 19 meq/L (ref 19–32)
Calcium: 7.1 mg/dL — ABNORMAL LOW (ref 8.4–10.5)
Chloride: 101 mEq/L (ref 96–112)
Creatinine, Ser: 2.5 mg/dL — ABNORMAL HIGH (ref 0.50–1.35)
GFR calc non Af Amer: 24 mL/min — ABNORMAL LOW (ref >90)
GFR, EST AFRICAN AMERICAN: 28 mL/min — AB (ref >90)
GLUCOSE: 97 mg/dL (ref 70–99)
POTASSIUM: 4.2 meq/L (ref 3.7–5.3)
Sodium: 139 mEq/L (ref 137–147)
Total Bilirubin: 1.5 mg/dL — ABNORMAL HIGH (ref 0.3–1.2)
Total Protein: 4 g/dL — ABNORMAL LOW (ref 6.0–8.3)

## 2013-08-24 LAB — CBC WITH DIFFERENTIAL/PLATELET
BASOS PCT: 0 % (ref 0–1)
Basophils Absolute: 0 10*3/uL (ref 0.0–0.1)
EOS ABS: 0 10*3/uL (ref 0.0–0.7)
Eosinophils Relative: 0 % (ref 0–5)
HCT: 23.8 % — ABNORMAL LOW (ref 39.0–52.0)
HEMOGLOBIN: 8 g/dL — AB (ref 13.0–17.0)
LYMPHS PCT: 46 % (ref 12–46)
Lymphs Abs: 16.6 10*3/uL — ABNORMAL HIGH (ref 0.7–4.0)
MCH: 29.9 pg (ref 26.0–34.0)
MCHC: 33.6 g/dL (ref 30.0–36.0)
MCV: 88.8 fL (ref 78.0–100.0)
MONO ABS: 3.6 10*3/uL — AB (ref 0.1–1.0)
Monocytes Relative: 10 % (ref 3–12)
Neutro Abs: 15.9 10*3/uL — ABNORMAL HIGH (ref 1.7–7.7)
Neutrophils Relative %: 44 % (ref 43–77)
Platelets: 70 10*3/uL — ABNORMAL LOW (ref 150–400)
RBC: 2.68 MIL/uL — ABNORMAL LOW (ref 4.22–5.81)
RDW: 14.8 % (ref 11.5–15.5)
WBC: 36.1 10*3/uL — ABNORMAL HIGH (ref 4.0–10.5)

## 2013-08-24 LAB — CBC
HCT: 20.9 % — ABNORMAL LOW (ref 39.0–52.0)
HCT: 21.4 % — ABNORMAL LOW (ref 39.0–52.0)
HCT: 18.8 % — ABNORMAL LOW (ref 39.0–52.0)
HEMATOCRIT: 24.7 % — AB (ref 39.0–52.0)
HEMOGLOBIN: 7.2 g/dL — AB (ref 13.0–17.0)
HEMOGLOBIN: 7.2 g/dL — AB (ref 13.0–17.0)
Hemoglobin: 8.3 g/dL — ABNORMAL LOW (ref 13.0–17.0)
Hemoglobin: 6.5 g/dL — CL (ref 13.0–17.0)
MCH: 30.5 pg (ref 26.0–34.0)
MCH: 30.1 pg (ref 26.0–34.0)
MCH: 30.3 pg (ref 26.0–34.0)
MCH: 31 pg (ref 26.0–34.0)
MCHC: 34.4 g/dL (ref 30.0–36.0)
MCHC: 33.6 g/dL (ref 30.0–36.0)
MCHC: 33.6 g/dL (ref 30.0–36.0)
MCHC: 34.6 g/dL (ref 30.0–36.0)
MCV: 88.6 fL (ref 78.0–100.0)
MCV: 89.5 fL (ref 78.0–100.0)
MCV: 89.9 fL (ref 78.0–100.0)
MCV: 89.5 fL (ref 78.0–100.0)
PLATELETS: 73 10*3/uL — AB (ref 150–400)
PLATELETS: 59 10*3/uL — AB (ref 150–400)
Platelets: 111 10*3/uL — ABNORMAL LOW (ref 150–400)
Platelets: 90 10*3/uL — ABNORMAL LOW (ref 150–400)
RBC: 2.36 MIL/uL — ABNORMAL LOW (ref 4.22–5.81)
RBC: 2.76 MIL/uL — ABNORMAL LOW (ref 4.22–5.81)
RBC: 2.38 MIL/uL — ABNORMAL LOW (ref 4.22–5.81)
RBC: 2.1 MIL/uL — AB (ref 4.22–5.81)
RDW: 15.3 % (ref 11.5–15.5)
RDW: 14.6 % (ref 11.5–15.5)
RDW: 15.1 % (ref 11.5–15.5)
RDW: 15.1 % (ref 11.5–15.5)
WBC: 39.2 10*3/uL — ABNORMAL HIGH (ref 4.0–10.5)
WBC: 48.8 10*3/uL — ABNORMAL HIGH (ref 4.0–10.5)
WBC: 47.1 10*3/uL — ABNORMAL HIGH (ref 4.0–10.5)
WBC: 45.8 10*3/uL — ABNORMAL HIGH (ref 4.0–10.5)

## 2013-08-24 LAB — BASIC METABOLIC PANEL
BUN: 69 mg/dL — AB (ref 6–23)
CHLORIDE: 97 meq/L (ref 96–112)
CO2: 17 mEq/L — ABNORMAL LOW (ref 19–32)
CREATININE: 2.74 mg/dL — AB (ref 0.50–1.35)
Calcium: 7 mg/dL — ABNORMAL LOW (ref 8.4–10.5)
GFR calc Af Amer: 25 mL/min — ABNORMAL LOW (ref >90)
GFR, EST NON AFRICAN AMERICAN: 21 mL/min — AB (ref >90)
Glucose, Bld: 221 mg/dL — ABNORMAL HIGH (ref 70–99)
Potassium: 4.6 mEq/L (ref 3.7–5.3)
Sodium: 136 mEq/L — ABNORMAL LOW (ref 137–147)

## 2013-08-24 LAB — HEPARIN LEVEL (UNFRACTIONATED)

## 2013-08-24 LAB — PREPARE PLATELET PHERESIS: UNIT DIVISION: 0

## 2013-08-24 LAB — GLUCOSE, CAPILLARY
GLUCOSE-CAPILLARY: 156 mg/dL — AB (ref 70–99)
Glucose-Capillary: 209 mg/dL — ABNORMAL HIGH (ref 70–99)
Glucose-Capillary: 235 mg/dL — ABNORMAL HIGH (ref 70–99)
Glucose-Capillary: 77 mg/dL (ref 70–99)
Glucose-Capillary: 78 mg/dL (ref 70–99)
Glucose-Capillary: 79 mg/dL (ref 70–99)
Glucose-Capillary: 81 mg/dL (ref 70–99)

## 2013-08-24 LAB — RENAL FUNCTION PANEL
ALBUMIN: 2.5 g/dL — AB (ref 3.5–5.2)
BUN: 72 mg/dL — AB (ref 6–23)
CALCIUM: 6.9 mg/dL — AB (ref 8.4–10.5)
CHLORIDE: 98 meq/L (ref 96–112)
CO2: 18 mEq/L — ABNORMAL LOW (ref 19–32)
CREATININE: 2.88 mg/dL — AB (ref 0.50–1.35)
GFR calc Af Amer: 23 mL/min — ABNORMAL LOW (ref >90)
GFR, EST NON AFRICAN AMERICAN: 20 mL/min — AB (ref >90)
Glucose, Bld: 271 mg/dL — ABNORMAL HIGH (ref 70–99)
Phosphorus: 6.6 mg/dL — ABNORMAL HIGH (ref 2.3–4.6)
Potassium: 4.1 mEq/L (ref 3.7–5.3)
Sodium: 137 mEq/L (ref 137–147)

## 2013-08-24 LAB — APTT: aPTT: 33 seconds (ref 24–37)

## 2013-08-24 LAB — PREPARE RBC (CROSSMATCH)

## 2013-08-24 LAB — PHOSPHORUS
PHOSPHORUS: 1.6 mg/dL — AB (ref 2.3–4.6)
Phosphorus: 5.3 mg/dL — ABNORMAL HIGH (ref 2.3–4.6)
Phosphorus: 7.2 mg/dL — ABNORMAL HIGH (ref 2.3–4.6)

## 2013-08-24 LAB — MAGNESIUM
MAGNESIUM: 1.8 mg/dL (ref 1.5–2.5)
MAGNESIUM: 2.8 mg/dL — AB (ref 1.5–2.5)

## 2013-08-24 LAB — TROPONIN I: TROPONIN I: 0.73 ng/mL — AB (ref <0.30)

## 2013-08-24 LAB — PROTIME-INR
INR: 2.31 — ABNORMAL HIGH (ref 0.00–1.49)
Prothrombin Time: 24.6 seconds — ABNORMAL HIGH (ref 11.6–15.2)

## 2013-08-24 LAB — CLOSTRIDIUM DIFFICILE BY PCR: CDIFFPCR: NEGATIVE

## 2013-08-24 MED ORDER — VITAMIN K1 10 MG/ML IJ SOLN
10.0000 mg | Freq: Once | INTRAVENOUS | Status: AC
  Administered 2013-08-24: 10 mg via INTRAVENOUS
  Filled 2013-08-24: qty 1

## 2013-08-24 MED ORDER — VASOPRESSIN 20 UNIT/ML IJ SOLN
0.0300 [IU]/min | INTRAVENOUS | Status: DC
  Administered 2013-08-24 – 2013-08-25 (×2): 0.03 [IU]/min via INTRAVENOUS
  Filled 2013-08-24 (×2): qty 2.5

## 2013-08-24 MED ORDER — HYDROCORTISONE NA SUCCINATE PF 100 MG IJ SOLR
50.0000 mg | Freq: Four times a day (QID) | INTRAMUSCULAR | Status: DC
  Administered 2013-08-24 – 2013-08-27 (×12): 50 mg via INTRAVENOUS
  Filled 2013-08-24 (×16): qty 1

## 2013-08-24 NOTE — Progress Notes (Signed)
Patient ID: [name], male   DOB: 1940/08/17, 73 y.o.   MRN: [id number] Date of Encounter: 08/24/2013  Principal Problem:   Acute on chronic respiratory failure Active Problems:   Acute exacerbation of chronic obstructive pulmonary disease (COPD)   History of lung cancer, s/p RUL resection 2009 @ [facility]   Mediastinal adenopathy   Smoker   Atrial fibrillation with RVR   PRWP on ECG   New onset atrial fibrillation   Chest tightness   Peripheral vascular disease   Vascular graft infection, on chronic doxycycline   Thrombocytopenia   Acute respiratory failure   Acute combined systolic and diastolic heart failure   Septic shock(785.52)   Length of Stay: 8  SUBJECTIVE Yesterday's events noted, PEA arrest late yesterday afternoon,  restoration of perfusing, stable rhythm after 6 minutes of resuscitation. He was found to have a left-sided spontaneous retroperitoneal hematoma. Notable drop in hemoglobin, that could explain the difficulty control A. fib rates. Amiodarone was discontinued, appropriately yesterday do to PEA arrest with profound bradycardia. Unfortunately now he is considerably agitated, and again tachycardic in A. fib   CURRENT MEDS . antiseptic oral rinse  15 mL Mouth Rinse q12n4p  . budesonide  0.25 mg Nebulization Q6H  . cefTRIAXone (ROCEPHIN)  IV  1 g Intravenous Daily  . chlorhexidine  15 mL Mouth Rinse BID  . hydrocortisone sod succinate (SOLU-CORTEF) inj  50 mg Intravenous Q6H  . insulin aspart  0-9 Units Subcutaneous Q4H  . ipratropium  0.5 mg Nebulization Q6H  . levalbuterol  0.63 mg Nebulization Q6H  . metoprolol tartrate  12.5 mg Oral Q6H  . pantoprazole sodium  40 mg Per Tube QHS    OBJECTIVE  Filed Vitals:   08/24/13 1245 08/24/13 1254 08/24/13 1300 08/24/13 1307  BP:   96/56   Pulse: 103 100 101 110  Temp: 98.3 F (36.8 C) 98.3 F (36.8 C) 98.3 F (36.8 C) 98.4 F (36.9 C)  TempSrc:  Core (Comment)    Resp: 22 24 24 20   Height:      Weight:       SpO2: 99% 99% 100% 99%    Intake/Output Summary (Last 24 hours) at 08/24/13 1316 Last data filed at 08/24/13 1254  Gross per 24 hour  Intake 3988.1 ml  Output    204 ml  Net 3784.1 ml   Filed Weights   08/22/13 0500 08/23/13 0500 08/24/13 0500  Weight: 155 lb 6.8 oz (70.5 kg) 157 lb 6.5 oz (71.4 kg) 163 lb 12.8 oz (74.3 kg)    PHYSICAL EXAM  General: Agitated, moving all 4 extremities but not responsive to verbal commands. Neuro: A&O. Moves all extremities spontaneously. Seems quite agitated after weaning protocol HEENT:  Normal  Neck: Supple without bruits or JVD. Lungs:  Resp regular and unlabored, CTA. Heart: Tachycardic Irreg/Irreg; unable to auscultate M./R./G. due to tachycardic rate and Abdomen: Soft, non-tender, non-distended, BS + x 4.  Extremities: No clubbing, cyanosis or edema. DP/PT/Radials 2+ and equal bilaterally.  Accessory Clinical Findings  CBC  Recent Labs  08/23/13 1616 08/23/13 2250 08/24/13 0413 08/24/13 0859  WBC 32.3* 36.1* 39.2* 48.8*  NEUTROABS 10.3* 15.9*  --   --   HGB 6.6* 8.0* 7.2* 8.3*  HCT 19.7* 23.8* 20.9* 24.7*  MCV 90.4 88.8 88.6 89.5  PLT 76* 70* 111* 90*   Basic Metabolic Panel  Recent Labs  08/23/13 1300  08/23/13 1616 08/24/13 0121 08/24/13 0413  NA  --   < >  148*  --  139  K  --   < > 4.2  --  4.2  CL  --   < > 100  --  101  CO2  --   < > 25  --  19  GLUCOSE  --   < > 139*  --  97  BUN  --   < > 51*  --  62*  CREATININE  --   < > 2.00*  --  2.50*  CALCIUM  --   < > 7.2*  --  7.1*  MG 2.9*  --   --  1.8  --   PHOS 3.8  < >  --  1.6* 5.3*  < > = values in this interval not displayed. Liver Function Tests  Recent Labs  08/23/13 1616 08/24/13 0413  AST 85* 419*  ALT 382* 574*  ALKPHOS 59 66  BILITOT 0.9 1.5*  PROT 3.6* 4.0*  ALBUMIN 1.8* 2.3*   Radiology/Studies Chest x-ray December 2(personally reviewed): ET tube in place. Bibasilar atelectasis with probable bilateral effusions. No PTX --  stable Echocardiogram in January 28:EF 15-20% with a likely grade 3 diastolic dysfunction. Moderate MR.  TELE AFib - RVR  ASSESSMENT AND PLAN Atrial fibrillation with RVR again. The pressure somewhat stable. However with PEA arrest I would not recommend restarting amiodarone could consider continuing low-dose beta blocker. I suspect that the anemia may be driving rapid rate.  Unfortunately in the setting of her treadmill hemorrhage, any dilation was discontinued. Would not restart until hemoglobin is stable. Unfortunately, this precludes cardioversion   very unfortunate similar circumstances. I am agreeable with stopping Plavix as well as heparin for now, there is an increased risk of stroke, however would be relatively significant bleed and retroperitoneal space, bleeding his to be stopped. We would therefore be at increased risk for stroke, but our hand is forced.   At this particular time I would continue to transfuse as needed and to tolerate heart rates in the 110 to 1:30 range as long as the blood pressure stable. Worked to reduce agitation, as this does seem to increase heart rate.  We'll continue to follow.  Leonie Man, M.D., M.S. [facility] [address] [place], [place]  [id number]  [phone] Pager # [phone] 08/24/2013 1:16 PM

## 2013-08-24 NOTE — Progress Notes (Signed)
I have had extensive discussions with family, wife. We discussed patients current circumstances and organ failures. We also discussed patient's prior wishes under circumstances such as this. Family has decided to NOT perform resuscitation if arrest but to continue current medical support for now. No trach option, continue other support cvvhd in future ok  [name]. Titus Mould, MD, [place] Pgr: [place] [facility]

## 2013-08-24 NOTE — Progress Notes (Signed)
Patients hgb returned this afternoon and is down to 7.2 from previously 8 this morning. This is post transfusion of 2 units RBCs. The patient has additionally received platelets and is on his 3rd unit of FFP. I discussed this case with Dr Chase Caller. The patient has not had increasing pressor requirements at this time and has not had an MI during this hospitalization. It was determined that to maintain the hgb above 7. Will continue with q6 hr CBC to monitor hgb and will plan to transfuse if less than 7. Nurse was updated with the plan. Additionally Cr continues to rise. Will continue to monitor and will need to consider CVVHD if continues to rise.  Tommi Rumps, MD [place] PGY-2  Staff note  D./w resident: plan to get another cbc after FFP transusin around 7pm per RN  Dr. Brand Males, M.D., [facility].C.P Pulmonary and Critical Care Medicine Staff Physician [place] [facility] Pager: [phone], If no answer or between  15:00h - 7:00h: call [phone]  08/24/2013 7:10 PM

## 2013-08-24 NOTE — Progress Notes (Signed)
  Recent Labs Lab 08/24/13 0859 08/24/13 1337 08/24/13 1900  HGB 8.3* 7.2* 6.5*  HCT 24.7* 21.4* 18.8*  WBC 48.8* 47.1* 45.8*  PLT 90* 73* 59*    Recent Labs Lab 08/18/13 0025 08/23/13 2250 08/24/13 1900  INR 2.48* 2.82* 2.31*   Continues to bleed and decline  p 1 unit prbc 1 ffp 1 plat 6 pack  Dr. Brand Males, M.D., [facility].C.P Pulmonary and Critical Care Medicine Staff Physician [place] [facility] Pager: [phone], If no answer or between  15:00h - 7:00h: call [phone]  08/24/2013 8:49 PM

## 2013-08-24 NOTE — Progress Notes (Signed)
Admit: 08/16/2013 LOS: 8  5M w/ VDRF in setting of COPD, systolic HF, septic shock with shock liver, AKI 2/2 ATN requiring CRRT (08/19/13-08/21/13).    Subjective:  PEA arrest yesterday, plavix and heparin off Has retroperitoneal hemorrhage UOP has dropped off post arrest/yesterday On PE gtt  02/03 0701 - 02/04 0700 In: 3667.7 [I.V.:2719.7; Blood:718; NG/GT:180; IV Piggyback:50] Out: 323 [Urine:323]  Filed Weights   08/22/13 0500 08/23/13 0500 08/24/13 0500  Weight: 70.5 kg (155 lb 6.8 oz) 71.4 kg (157 lb 6.5 oz) 74.3 kg (163 lb 12.8 oz)    Current meds: reviewed  Current Labs: reviewed     Physical Exam:  Blood pressure 96/67, pulse 58, temperature 97.9 F (36.6 C), temperature source Core (Comment), resp. rate 22, height 5\' 9"  (1.753 m), weight 74.3 kg (163 lb 12.8 oz), SpO2 93.00%. Intubated ,sedated, eyes open to stimuli IRIR, tachycardic Coarse bs b/l No rashes/lesions No LEE Foley in place Abd somewhat firm, no grimace to palpation. R IJ HD cath noted   Assessment/Plan 1. AKI 2/2 ATN: Had come off CRRT and had some suggestion of recovery with good UOP and stable SCr, but post PEA now on pressors, reduced UOP, and inc SCr.  CCM to talk with family, discuss goals of care.  Likely will need to return to CRRT in next 24h if this course is chosen.  K and HCO3 stable.    Pearson Grippe MD 08/24/2013, 8:35 AM   Recent Labs Lab 08/23/13 1600 08/23/13 1616 08/24/13 0121 08/24/13 0413  NA 138 148*  --  139  K 4.0 4.2  --  4.2  CL 100 100  --  101  CO2 20 25  --  19  GLUCOSE 177* 139*  --  97  BUN 54* 51*  --  62*  CREATININE 2.01* 2.00*  --  2.50*  CALCIUM 7.2* 7.2*  --  7.1*  PHOS 4.1  --  1.6* 5.3*    Recent Labs Lab 08/21/13 0425  08/23/13 1616 08/23/13 2250 08/24/13 0413  WBC 18.6*  < > 32.3* 36.1* 39.2*  NEUTROABS 10.9*  --  10.3* 15.9*  --   HGB 12.1*  < > 6.6* 8.0* 7.2*  HCT 35.9*  < > 19.7* 23.8* 20.9*  MCV 91.3  < > 90.4 88.8 88.6  PLT 69*  < > 76*  70* 111*  < > = values in this interval not displayed.

## 2013-08-24 NOTE — Progress Notes (Addendum)
Patient ID: PHELAN KVAM, male   DOB: 01/04/1941, 73 y.o.   MRN: [id number]    Name: [name] MRN: [id number] DOB: [birth year]    ADMISSION DATE:  08/16/2013  REFERRING MD :  EDP PRIMARY SERVICE: PCCM  CHIEF COMPLAINT:  Dyspnea  BRIEF PATIENT DESCRIPTION:   20 M with COPD, hx of lung ca, PVD, current smoker presented to [facility] ED with 2 wks of progressive dyspnea and chest tightness minimally and transiently responsive to albuterol MDI. Developed afib rvr, abdominal process,  resp failure, ETT placed and renal failure, sepsis.  LINES / TUBES: 1/28 OTT>> 1/28 l i j cvl>> Rt fem aline 1/29>>> 1/29 rt ij hd>>>  CULTURES: Blood 1/27 >> neg resp virus 1/28 >> Neg Flu pcr 1/28 >> NEG 1/29 sputum>> Normal flora 1/30 uc>>neg  ANTIBIOTICS: Doxycycline (chronic)>>> Held 1/29 tamiflu 1/28 (ongoing till resp virus multiplex pcr results return) >> 1/29 1/29 vanc>>>2/2 1/29 zosyn>>>2/2 2/2 ceftriaxone >>>stop 2/5  SIGNIFICANT EVENTS / STUDIES:  1/27 CT chest: Negative for PE.  Postop changes right upper lobectomy  Interval increased mediastinal adenopathy. Largest lymph nodes in the left prevascular/ AP window space and the right paratracheal region.  Small chronic residual right subpulmonic effusion with small loculated components and pleural thickening all appearing chronic.  Centrilobular emphysema.    08/23/2013   CT ABD/Pelvis IMPRESSION: 1. Large left retroperitoneal hematoma as above. 2. Moderate bilateral pleural effusions with diffuse anasarca. 3. Small to moderate volume ascites within the abdomen. 4. Relative hypodensity within the cardiac blood pool, suggesting anemia. 5. Extensive aorto bi-iliac atherosclerotic disease with vascular stents in place and unchanged left common femoral artery aneurysm Critical Value/emergent results were called by telephone at the time of interpretation on 08/23/2013 at 10:14 PM to Dr. Howard Pouch , who verbally acknowledged these results.    Electronically Signed   By: Jeannine Boga M.D.   On: 08/23/2013 22:24   1/28 intubated 1/29 Transferred to [facility] 1/29 bronch BAL 1/30 cvvhd 1/30 hep panel and flu panel neg 2/1 planned to extubate> failed 2/1 off pressors 2/2 CXR unchanged 2/2 TF started 2/3 code blue, elevated trop, hb 6.6, CT with large retroperitoneal bleed, neo restarted, transfused 2/3 soft mitten restraints  SUBJECTIVE/OVERNIGHT/INTERVAL HX- restarted pressure support s/p code. Hb dropping after transfusion  VITAL SIGNS: Temp:  [95.8 F (35.4 C)-98.6 F (37 C)] 98.5 F (36.9 C) (02/04 0600) Pulse Rate:  [25-144] 144 (02/04 0600) Resp:  [6-48] 26 (02/04 0600) BP: (58-114)/(21-92) 76/45 mmHg (02/04 0600) SpO2:  [52 %-100 %] 52 % (02/04 0600) Arterial Line BP: (19-198)/(8-112) 117/49 mmHg (02/04 0600) FiO2 (%):  [40 %-100 %] 40 % (02/04 0400) Weight:  [163 lb 12.8 oz (74.3 kg)] 163 lb 12.8 oz (74.3 kg) (02/04 0500) HEMODYNAMICS: CVP:  [7 mmHg-9 mmHg] 7 mmHg VENTILATOR SETTINGS: Vent Mode:  [-] PRVC FiO2 (%):  [40 %-100 %] 40 % Set Rate:  [24 bmp] 24 bmp Vt Set:  [500 mL] 500 mL PEEP:  [5 cmH20] 5 cmH20 Pressure Support:  [5 cmH20] 5 cmH20 Plateau Pressure:  [17 cmH20-22 cmH20] 18 cmH20 INTAKE / OUTPUT: Intake/Output     02/03 0701 - 02/04 0700   I.V. (mL/kg) 2719.7 (36.6)   Blood 718   NG/GT 180   IV Piggyback 50   Total Intake(mL/kg) 3667.7 (49.4)   Urine (mL/kg/hr) 323 (0.2)   Total Output 323   Net +3344.7       Stool Occurrence 1 x     PHYSICAL  EXAMINATION: General:  Sedated on vent Neuro:  Sedated.   awakens to name, makes eye contact HEENT:  EOMI, PERRL OTT-Vent, OGT Cardiovascular: Irreg rate and rhythm, tachy, no M Lungs: Mild diffuse coarse breath sounds.  Abdomen: firm, ? tenderness,  distended. hypoactive BS Ext: warm, +1 -2 edema, diminished DP pulses  LABS: PULMONARY  Recent Labs Lab 08/19/13 1120 08/19/13 1317 08/22/13 1348 08/23/13 1617 08/23/13 2020   PHART 7.279* 7.370 7.438 7.250* 7.402  PCO2ART 51.2* 39.7 35.6 37.0 33.3*  PO2ART 78.0* 70.0* 113.0* 392.0* 222.0*  HCO3 24.1* 23.2 24.1* 16.2* 20.8  TCO2 26 24 25 17 22   O2SAT 94.0 94.0 99.0 100.0 100.0    CBC  Recent Labs Lab 08/23/13 1616 08/23/13 2250 08/24/13 0413  HGB 6.6* 8.0* 7.2*  HCT 19.7* 23.8* 20.9*  WBC 32.3* 36.1* 39.2*  PLT 76* 70* 111*    COAGULATION  Recent Labs Lab 08/18/13 0025 08/23/13 2250  INR 2.48* 2.82*    CARDIAC    Recent Labs Lab 08/18/13 0527 08/18/13 2140 08/23/13 1616 08/23/13 2213 08/24/13 0400  TROPONINI <0.30 <0.30 <0.30 0.51* 0.73*    Recent Labs Lab 08/19/13 0320  PROBNP 22750.0*     CHEMISTRY  Recent Labs Lab 08/21/13 0425  08/22/13 1337 08/22/13 1600 08/23/13 0137 08/23/13 0440 08/23/13 1300 08/23/13 1600 08/23/13 1616 08/24/13 0121 08/24/13 0413  NA  --   < >  --  139  --  137  --  138 148*  --  139  K  --   < >  --  4.3  --  4.2  --  4.0 4.2  --  4.2  CL  --   < >  --  102  --  101  --  100 100  --  101  CO2  --   < >  --  24  --  23  --  20 25  --  19  GLUCOSE  --   < >  --  129*  --  172*  --  177* 139*  --  97  BUN  --   < >  --  39*  --  43*  --  54* 51*  --  62*  CREATININE  --   < >  --  1.68*  --  1.69*  --  2.01* 2.00*  --  2.50*  CALCIUM  --   < >  --  7.5*  --  7.5*  --  7.2* 7.2*  --  7.1*  MG 2.6*  --  2.8*  --  2.8*  --  2.9*  --   --  1.8  --   PHOS 2.3  < > 2.7 2.9 3.5 3.6 3.8 4.1  --  1.6* 5.3*  < > = values in this interval not displayed. Estimated Creatinine Clearance: 26.3 ml/min (by C-G formula based on Cr of 2.5).   LIVER  Recent Labs Lab 08/18/13 0025  08/21/13 1105  08/22/13 0429 08/22/13 1600 08/23/13 0440 08/23/13 1600 08/23/13 1616 08/23/13 2250 08/24/13 0413  AST  --   < > 223*  --  166*  --  99*  --  85*  --  419*  ALT  --   < > 937*  --  750*  --  495*  --  382*  --  574*  ALKPHOS  --   < > 56  --  57  --  49  --  59  --  66  BILITOT  --   < > 1.3*  --   1.5*  --  1.2  --  0.9  --  1.5*  PROT  --   < > 5.1*  --  4.9*  --  4.3*  --  3.6*  --  4.0*  ALBUMIN  --   < > 2.7*  < > 2.7* 2.3* 2.4* 2.0* 1.8*  --  2.3*  INR 2.48*  --   --   --   --   --   --   --   --  2.82*  --   < > = values in this interval not displayed.   INFECTIOUS  Recent Labs Lab 08/18/13 2140 08/19/13 1130 08/23/13 1616  LATICACIDVEN 3.1* 2.4* 10.5*     ENDOCRINE CBG (last 3)   Recent Labs  08/23/13 2348 08/24/13 0211 08/24/13 0351  GLUCAP 79 81 78    IMAGING x48h  Ct Abdomen Pelvis Wo Contrast  08/23/2013   CLINICAL DATA:  Evaluate for retroperitoneal hemorrhage  EXAM: CT ABDOMEN AND PELVIS WITHOUT CONTRAST  TECHNIQUE: Multidetector CT imaging of the abdomen and pelvis was performed following the standard protocol without intravenous contrast.  COMPARISON:  Prior CT from 08/18/2013.  FINDINGS: Moderate bilateral pleural effusions are present. There is associated compressive atelectasis within the left lower lobe. Emphysematous changes again noted. Cardiomegaly is stable. Blood pool within the heart is relatively hypodense, suggesting anemia.  Liver demonstrates a normal unenhanced appearance. Small the gallbladder is decompressed and not well evaluated. Biliary ductal dilatation. The spleen and right adrenal gland are unremarkable. The left adrenal gland is grossly normal. Pancreas is within normal limits.  Limited noncontrast evaluation of the kidneys is grossly unremarkable. The left kidney is displaced anteriorly.  Enteric tube is coiled within the stomach. No evidence of bowel obstruction. Colonic diverticulosis again noted. No free intraperitoneal air.  A large heterogeneous collection with main component measuring approximately 9.4 x 7.9 x 14.1 cm is present within the left retroperitoneum (series 2, image 45). Finding is consistent with retroperitoneal hematoma. Heterogeneous hyperdensity is seen within the adjacent left psoas muscle which is thickened and  enlarged, also compatible with hemorrhage. This collection extends inferiorly into the left hemipelvis with probable blood seen within the presacral space. Hemorrhage is seen along the left pericolic gutter as well. The left kidney and spleen are displaced anteriorly with anterior and medial displacement of the left adrenal gland. A portion of this collection appears more loculated superiorly, measuring approximately 4.0 x 6.1 cm (series 2, image 33). Additional someone loculated component seen inferiorly in the left hemipelvis measuring approximately 5.3 x 2.6 cm (series 2, image 74). Evaluation for active bleeding limited on this noncontrast examination.  The bladder remains decompressed with a Foley catheter in place. Prostate is within normal limits.  No free air identified. Small volume ascites seen within the abdomen, similar to prior No definite enlarged intra-abdominal or pelvic adenopathy is seen.  Extensive aorto bi-iliac atherosclerotic calcifications again noted. Vascular stent within the celiac axis and SMA are unchanged. The bilateral iliac is noted. Left femoral artery aneurysm again noted with adjacent soft tissue density and surgical clips, unchanged, and likely postsurgical.  Multilevel degenerative changes noted within the visualized spine. No focal lytic or blastic osseous lesions are identified. Geographic sclerosis noted within the right sacrum, of uncertain etiology.  Diffuse anasarca noted.  IMPRESSION: 1. Large left retroperitoneal hematoma as above. 2. Moderate bilateral pleural effusions with diffuse anasarca. 3. Small to moderate volume  ascites within the abdomen. 4. Relative hypodensity within the cardiac blood pool, suggesting anemia. 5. Extensive aorto bi-iliac atherosclerotic disease with vascular stents in place and unchanged left common femoral artery aneurysm Critical Value/emergent results were called by telephone at the time of interpretation on 08/23/2013 at 10:14 PM to Dr. Howard Pouch , who verbally acknowledged these results.   Electronically Signed   By: Jeannine Boga M.D.   On: 08/23/2013 22:24   Dg Chest Port 1 View  08/23/2013   CLINICAL DATA:  Status post CPR  EXAM: PORTABLE CHEST - 1 VIEW  COMPARISON:  08/23/2013 1038 hrs  FINDINGS: The cardiac shadow is stable. A right jugular dialysis catheter is again seen. The left central venous line is again noted. An endotracheal tube and nasogastric catheter are again seen. The endotracheal tube is 2.5 cm above the carinal. No pneumothorax or rib fractures are noted.  IMPRESSION: Stable appearance of the chest when compare with the prior exam.   Electronically Signed   By: Inez Catalina M.D.   On: 08/23/2013 16:46   Dg Chest Port 1 View  08/23/2013   CLINICAL DATA:  Respiratory failure.  EXAM: PORTABLE CHEST - 1 VIEW  COMPARISON:  DG CHEST 1V PORT dated 08/22/2013; DG CHEST 1V PORT dated 08/20/2013; CT ANGIO CHEST W/CM &/OR WO/CM dated 08/16/2013  FINDINGS: The endotracheal tube tip is approximately 2.5 cm above the carina. Bilateral central line positioning is stable. Lung shows stable chronic disease with some volume loss of the right lung after prior lung resection. No edema, focal airspace consolidation, significant pleural fluid or pneumothorax is visualized. There is stable cardiomegaly.  IMPRESSION: Stable chronic lung disease. No edema, significant airspace consolidation or pneumothorax.   Electronically Signed   By: Aletta Edouard M.D.   On: 08/23/2013 11:47   CXR: 08/23/2013   CLINICAL DATA:  Respiratory failure.  EXAM: PORTABLE CHEST - 1 VIEW  COMPARISON:  DG CHEST 1V PORT dated 08/22/2013; DG CHEST 1V PORT dated 08/20/2013; CT ANGIO CHEST W/CM &/OR WO/CM dated 08/16/2013  FINDINGS: The endotracheal tube tip is approximately 2.5 cm above the carina. Bilateral central line positioning is stable. Lung shows stable chronic disease with some volume loss of the right lung after prior lung resection. No edema, focal airspace  consolidation, significant pleural fluid or pneumothorax is visualized. There is stable cardiomegaly.  IMPRESSION: Stable chronic lung disease. No edema, significant airspace consolidation or pneumothorax.   Electronically Signed   By: Aletta Edouard M.D.   On: 08/23/2013 11:47   ASSESSMENT / PLAN:  PULMONARY A:  Acute on chronic respiratory failure AECOPD Hx of lung cancer s/p resection 2009 Increased mediatinal LAN, nonspecific  P:   -follow balance closely -abg reviewed, maintain current MV -on low dose pressors, can exercise cpap 5 ps 5, goal 2 hrs, assess rsbi, unlikely to extubate - may need cvvhd early with products  CARDIOVASCULAR A:  Nonspecific chest pain PRWP on EKG New onset AFRVR-an issue for weaning Peripheral vascular disease P: - Amio off, hep off, ASA and plavix stopped, trop elevated (0.51>0.73)) x2 - Neo  max at 200 - Stress dose steroids to 50 mg q 6 -add vaso if BP drops further -may need additional rate control agents, give prbc prior   Intake/Output Summary (Last 24 hours) at 08/24/13 0617 Last data filed at 08/24/13 0600  Gross per 24 hour  Intake 3703.2 ml  Output    323 ml  Net 3380.2 ml   RENAL Lab Results  Component Value Date   CREATININE 2.50* 08/24/2013   CREATININE 2.00* 08/23/2013   CREATININE 2.01* 08/23/2013    Recent Labs Lab 08/23/13 1600 08/23/13 1616 08/24/13 0413  K 4.0 4.2 4.2   A: New renal failure, no hydro on CT, ATN, pos balance again P:   - CRT dc'd 2/1, Consider restart s/p coded;  if UOP declines (323/24h), cr worsening (2.5) and phos elevating (5.3)  -Renal following -will clarify goals with wife  GASTROINTESTINAL A:  Chronic PPI use, shock liverimproving, unlikley abdo ischemia, large retroper bleed P:   SUP: Cont home dose of ppi Continue to monitor lft TF restart  HEMATOLOGIC A:  Thrombocytopenia consumption now       Anemia       Active retroperitoneal bleed  coagulapthy (consumption, liver) P: hep  off, plavix discontinued 1 unit RBC and 1unit  plt given last night, hb 6.6--> 8.0--> 7.2, pt receiving another unit of RBC now Plat repeat  113->90->69 > 85 > 88> 1u plt>> 111 Cbc post transfusion Repeat coags STAt for consumptive coagulapthy Stat vit K , ffp STAT coags rpeat after Check fibrinogen  INFECTIOUS A:   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 Might flu like  resp virus (flu pcr negative) 2/3 pt develop leukocytosis with retroper bleed P:   Ceftriaxone to stop after today, allow to dc  ENDOCRINE CBG (last 3)   Recent Labs  08/23/13 2348 08/24/13 0211 08/24/13 0351  GLUCAP 79 81 78   A: rel AI P:   Re increase to stress roids SSI for glu > 180 tsh (1.9)  NEUROLOGIC A:  Mild depression, agitated P:   Sedated on vent on fent 100  Mcg Soft mitten restraints started at 1858 2/3   TODAY'S SUMMARY:  2/4: Continue with pressor support and monitor of hb, for blood, ffp, vit k, will d/w wife code status goals  Renee A Kuneff, DO [phone]  Ccm time 30 min   I have fully examined this patient and agree with above findings.    And edited in full  [name]. Titus Mould, MD, [place] Pgr: [place] [facility]

## 2013-08-25 LAB — CBC
HCT: 24.6 % — ABNORMAL LOW (ref 39.0–52.0)
HCT: 23.9 % — ABNORMAL LOW (ref 39.0–52.0)
HEMATOCRIT: 20.1 % — AB (ref 39.0–52.0)
HEMATOCRIT: 20.4 % — AB (ref 39.0–52.0)
HEMOGLOBIN: 8.5 g/dL — AB (ref 13.0–17.0)
Hemoglobin: 7 g/dL — ABNORMAL LOW (ref 13.0–17.0)
Hemoglobin: 7.1 g/dL — ABNORMAL LOW (ref 13.0–17.0)
Hemoglobin: 8.2 g/dL — ABNORMAL LOW (ref 13.0–17.0)
MCH: 30.7 pg (ref 26.0–34.0)
MCH: 30.7 pg (ref 26.0–34.0)
MCH: 30.1 pg (ref 26.0–34.0)
MCH: 30 pg (ref 26.0–34.0)
MCHC: 34.8 g/dL (ref 30.0–36.0)
MCHC: 34.8 g/dL (ref 30.0–36.0)
MCHC: 34.6 g/dL (ref 30.0–36.0)
MCHC: 34.3 g/dL (ref 30.0–36.0)
MCV: 88.2 fL (ref 78.0–100.0)
MCV: 88.3 fL (ref 78.0–100.0)
MCV: 87.2 fL (ref 78.0–100.0)
MCV: 87.5 fL (ref 78.0–100.0)
PLATELETS: 86 10*3/uL — AB (ref 150–400)
Platelets: 108 10*3/uL — ABNORMAL LOW (ref 150–400)
Platelets: 103 10*3/uL — ABNORMAL LOW (ref 150–400)
Platelets: 73 10*3/uL — ABNORMAL LOW (ref 150–400)
RBC: 2.28 MIL/uL — AB (ref 4.22–5.81)
RBC: 2.31 MIL/uL — ABNORMAL LOW (ref 4.22–5.81)
RBC: 2.82 MIL/uL — ABNORMAL LOW (ref 4.22–5.81)
RBC: 2.73 MIL/uL — ABNORMAL LOW (ref 4.22–5.81)
RDW: 15.2 % (ref 11.5–15.5)
RDW: 15 % (ref 11.5–15.5)
RDW: 15 % (ref 11.5–15.5)
RDW: 15.2 % (ref 11.5–15.5)
WBC: 39.3 10*3/uL — AB (ref 4.0–10.5)
WBC: 40.4 10*3/uL — ABNORMAL HIGH (ref 4.0–10.5)
WBC: 41.2 10*3/uL — ABNORMAL HIGH (ref 4.0–10.5)
WBC: 40 10*3/uL — ABNORMAL HIGH (ref 4.0–10.5)

## 2013-08-25 LAB — GLUCOSE, CAPILLARY
GLUCOSE-CAPILLARY: 189 mg/dL — AB (ref 70–99)
GLUCOSE-CAPILLARY: 209 mg/dL — AB (ref 70–99)
Glucose-Capillary: 158 mg/dL — ABNORMAL HIGH (ref 70–99)
Glucose-Capillary: 205 mg/dL — ABNORMAL HIGH (ref 70–99)
Glucose-Capillary: 207 mg/dL — ABNORMAL HIGH (ref 70–99)
Glucose-Capillary: 250 mg/dL — ABNORMAL HIGH (ref 70–99)

## 2013-08-25 LAB — PREPARE FRESH FROZEN PLASMA
UNIT DIVISION: 0
Unit division: 0
Unit division: 0
Unit division: 0

## 2013-08-25 LAB — RENAL FUNCTION PANEL
ALBUMIN: 2.9 g/dL — AB (ref 3.5–5.2)
BUN: 89 mg/dL — ABNORMAL HIGH (ref 6–23)
CALCIUM: 7.1 mg/dL — AB (ref 8.4–10.5)
CO2: 19 mEq/L (ref 19–32)
Chloride: 97 mEq/L (ref 96–112)
Creatinine, Ser: 3.31 mg/dL — ABNORMAL HIGH (ref 0.50–1.35)
GFR calc non Af Amer: 17 mL/min — ABNORMAL LOW (ref >90)
GFR, EST AFRICAN AMERICAN: 20 mL/min — AB (ref >90)
Glucose, Bld: 214 mg/dL — ABNORMAL HIGH (ref 70–99)
PHOSPHORUS: 5.3 mg/dL — AB (ref 2.3–4.6)
Potassium: 3.6 mEq/L — ABNORMAL LOW (ref 3.7–5.3)
SODIUM: 138 meq/L (ref 137–147)

## 2013-08-25 LAB — COMPREHENSIVE METABOLIC PANEL
ALBUMIN: 2.7 g/dL — AB (ref 3.5–5.2)
ALT: 524 U/L — ABNORMAL HIGH (ref 0–53)
AST: 366 U/L — ABNORMAL HIGH (ref 0–37)
Alkaline Phosphatase: 80 U/L (ref 39–117)
BUN: 82 mg/dL — ABNORMAL HIGH (ref 6–23)
CALCIUM: 7.1 mg/dL — AB (ref 8.4–10.5)
CO2: 20 mEq/L (ref 19–32)
CREATININE: 3.06 mg/dL — AB (ref 0.50–1.35)
Chloride: 98 mEq/L (ref 96–112)
GFR calc Af Amer: 22 mL/min — ABNORMAL LOW (ref >90)
GFR calc non Af Amer: 19 mL/min — ABNORMAL LOW (ref >90)
Glucose, Bld: 180 mg/dL — ABNORMAL HIGH (ref 70–99)
Potassium: 3.5 mEq/L — ABNORMAL LOW (ref 3.7–5.3)
Sodium: 139 mEq/L (ref 137–147)
Total Bilirubin: 1.9 mg/dL — ABNORMAL HIGH (ref 0.3–1.2)
Total Protein: 5 g/dL — ABNORMAL LOW (ref 6.0–8.3)

## 2013-08-25 LAB — LEGIONELLA PROFILE(CULTURE+DFA/SMEAR): Legionella Antigen (DFA): NEGATIVE

## 2013-08-25 LAB — FIBRINOGEN: Fibrinogen: 216 mg/dL (ref 204–475)

## 2013-08-25 LAB — PHOSPHORUS: PHOSPHORUS: 5.6 mg/dL — AB (ref 2.3–4.6)

## 2013-08-25 LAB — PROTIME-INR
INR: 1.78 — ABNORMAL HIGH (ref 0.00–1.49)
Prothrombin Time: 20.2 seconds — ABNORMAL HIGH (ref 11.6–15.2)

## 2013-08-25 LAB — CLOSTRIDIUM DIFFICILE BY PCR: CDIFFPCR: NEGATIVE

## 2013-08-25 LAB — MAGNESIUM: Magnesium: 2.7 mg/dL — ABNORMAL HIGH (ref 1.5–2.5)

## 2013-08-25 LAB — PREPARE RBC (CROSSMATCH)

## 2013-08-25 MED ORDER — INSULIN GLARGINE 100 UNIT/ML ~~LOC~~ SOLN
5.0000 [IU] | Freq: Every day | SUBCUTANEOUS | Status: DC
  Administered 2013-08-25 – 2013-08-27 (×3): 5 [IU] via SUBCUTANEOUS
  Filled 2013-08-25 (×5): qty 0.05

## 2013-08-25 MED ORDER — VITAMIN K1 10 MG/ML IJ SOLN
10.0000 mg | Freq: Once | INTRAVENOUS | Status: AC
  Administered 2013-08-25: 10 mg via INTRAVENOUS
  Filled 2013-08-25: qty 1

## 2013-08-25 MED ORDER — FUROSEMIDE 10 MG/ML IJ SOLN
160.0000 mg | Freq: Two times a day (BID) | INTRAVENOUS | Status: DC
  Administered 2013-08-25 – 2013-08-26 (×3): 160 mg via INTRAVENOUS
  Filled 2013-08-25 (×4): qty 16

## 2013-08-25 MED ORDER — POTASSIUM CHLORIDE 20 MEQ/15ML (10%) PO LIQD
20.0000 meq | Freq: Once | ORAL | Status: AC
  Administered 2013-08-25: 20 meq via ORAL
  Filled 2013-08-25: qty 15

## 2013-08-25 MED ORDER — VANCOMYCIN 50 MG/ML ORAL SOLUTION
125.0000 mg | Freq: Four times a day (QID) | ORAL | Status: DC
  Administered 2013-08-25 – 2013-08-26 (×5): 125 mg via ORAL
  Filled 2013-08-25 (×8): qty 2.5

## 2013-08-25 MED ORDER — FAMOTIDINE 40 MG/5ML PO SUSR
20.0000 mg | Freq: Every day | ORAL | Status: DC
  Administered 2013-08-25 – 2013-08-27 (×3): 20 mg via ORAL
  Filled 2013-08-25 (×3): qty 2.5

## 2013-08-25 NOTE — Progress Notes (Signed)
Patient ID: [name], male   DOB: 1941/04/28, 73 y.o.   MRN: [id number]    Name: [name] MRN: [id number] DOB: 1941-05-20    ADMISSION DATE:  08/16/2013  REFERRING MD :  EDP PRIMARY SERVICE: PCCM  CHIEF COMPLAINT:  Dyspnea  BRIEF PATIENT DESCRIPTION:   67 M with COPD, hx of lung ca, PVD, current smoker presented to [facility] ED with 2 wks of progressive dyspnea and chest tightness minimally and transiently responsive to albuterol MDI. Developed afib rvr, abdominal process,  resp failure, ETT placed and renal failure, sepsis.  LINES / TUBES: 1/28 OTT>> 1/28 l i j cvl>> Rt fem aline 1/29>>> 1/29 rt ij hd>>> foley 2/4 rectal tube  CULTURES: Blood 1/27 >> neg resp virus 1/28 >> Neg Flu pcr 1/28 >> NEG 1/29 sputum>> Normal flora 1/30 uc>>neg  ANTIBIOTICS: Doxycycline (chronic)>>> Held 1/29 tamiflu 1/28 (ongoing till resp virus multiplex pcr results return) >> 1/29 1/29 vanc>>>2/2 1/29 zosyn>>>2/2 2/2 ceftriaxone >>>stop 2/5  SIGNIFICANT EVENTS / STUDIES:  1/27 CT chest: Negative for PE.  Postop changes right upper lobectomy  Interval increased mediastinal adenopathy. Largest lymph nodes in the left prevascular/ AP window space and the right paratracheal region.  Small chronic residual right subpulmonic effusion with small loculated components and pleural thickening all appearing chronic.  Centrilobular emphysema.    08/23/2013   CT ABD/Pelvis IMPRESSION: 1. Large left retroperitoneal hematoma as above. 2. Moderate bilateral pleural effusions with diffuse anasarca. 3. Small to moderate volume ascites within the abdomen. 4. Relative hypodensity within the cardiac blood pool, suggesting anemia. 5. Extensive aorto bi-iliac atherosclerotic disease with vascular stents in place and unchanged left common femoral artery aneurysm Critical Value/emergent results were called by telephone at the time of interpretation on 08/23/2013 at 10:14 PM to Dr. Howard Pouch , who verbally acknowledged these  results.   Electronically Signed   By: Jeannine Boga M.D.   On: 08/23/2013 22:24   1/28 intubated 1/29 Transferred to [facility] 1/29 bronch BAL 1/30 cvvhd 1/30 hep panel and flu panel neg 2/1 planned to extubate> failed 2/1 off pressors 2/2 CXR unchanged 2/2 TF started 2/3 code blue, elevated trop, hb 6.6, CT with large retroperitoneal bleed, neo restarted, transfused 2/3 soft mitten restraints 2/4 transfused, FFP, Vit K 2/5- remains on neo, pos gross balance  SUBJECTIVE/OVERNIGHT/INTERVAL HX- Continued pressor support, code status now DNR per family.   VITAL SIGNS: Temp:  [97.7 F (36.5 C)-98.6 F (37 C)] 97.8 F (36.6 C) (02/05 0800) Pulse Rate:  [81-125] 106 (02/05 0815) Resp:  [5-25] 16 (02/05 0815) BP: (73-147)/(42-74) 108/65 mmHg (02/05 0815) SpO2:  [96 %-100 %] 98 % (02/05 0815) Arterial Line BP: (101-152)/(47-64) 118/53 mmHg (02/05 0815) FiO2 (%):  [40 %] 40 % (02/05 0815) Weight:  [173 lb 11.6 oz (78.8 kg)] 173 lb 11.6 oz (78.8 kg) (02/05 0402) HEMODYNAMICS: CVP:  [10 mmHg-20 mmHg] 20 mmHg VENTILATOR SETTINGS: Vent Mode:  [-] CPAP;PSV FiO2 (%):  [40 %] 40 % Set Rate:  [24 bmp] 24 bmp Vt Set:  [500 mL] 500 mL PEEP:  [5 cmH20] 5 cmH20 Pressure Support:  [5 cmH20-8 cmH20] 5 cmH20 Plateau Pressure:  [19 cmH20-20 cmH20] 20 cmH20 INTAKE / OUTPUT: Intake/Output     02/04 0701 - 02/05 0700 02/05 0701 - 02/06 0700   I.V. (mL/kg) 3045 (38.6)    Blood 2046    Other 50    NG/GT 1500    IV Piggyback 50    Total Intake(mL/kg) 6691 (84.9)  Urine (mL/kg/hr) 495 (0.3)    Stool 650 (0.3)    Total Output 1145     Net +5546            PHYSICAL EXAMINATION: General:  Sedated on vent, foul stool odor Neuro:  Sedated.   awakens to name, makes eye contact HEENT:  EOMI, PERRL OTT-Vent, OGT Cardiovascular: Irreg rate and rhythm, tachy, no M Lungs: CTAB. No wheeze, crackles or rhonchi Abdomen: taut, ? tenderness,  distended. hypoactive BS Ext: Cold feet bilateral, warm   +1 -2 edema, diminished DP pulses   LABS: PULMONARY  Recent Labs Lab 08/19/13 1120 08/19/13 1317 08/22/13 1348 08/23/13 1617 08/23/13 2020  PHART 7.279* 7.370 7.438 7.250* 7.402  PCO2ART 51.2* 39.7 35.6 37.0 33.3*  PO2ART 78.0* 70.0* 113.0* 392.0* 222.0*  HCO3 24.1* 23.2 24.1* 16.2* 20.8  TCO2 26 24 25 17 22   O2SAT 94.0 94.0 99.0 100.0 100.0    CBC  Recent Labs Lab 08/24/13 1337 08/24/13 1900 08/25/13 0500  HGB 7.2* 6.5* 7.0*  HCT 21.4* 18.8* 20.1*  WBC 47.1* 45.8* 39.3*  PLT 73* 59* 108*    COAGULATION  Recent Labs Lab 08/23/13 2250 08/24/13 1900  INR 2.82* 2.31*    CARDIAC    Recent Labs Lab 08/18/13 2140 08/23/13 1616 08/23/13 2213 08/24/13 0400  TROPONINI <0.30 <0.30 0.51* 0.73*    Recent Labs Lab 08/19/13 0320  PROBNP 22750.0*     CHEMISTRY  Recent Labs Lab 08/22/13 1337  08/23/13 0137  08/23/13 1300  08/23/13 1616 08/24/13 0121 08/24/13 0413 08/24/13 1337 08/24/13 1900 08/25/13 0500  NA  --   < >  --   < >  --   < > 148*  --  139 136* 137 139  K  --   < >  --   < >  --   < > 4.2  --  4.2 4.6 4.1 3.5*  CL  --   < >  --   < >  --   < > 100  --  101 97 98 98  CO2  --   < >  --   < >  --   < > 25  --  19 17* 18* 20  GLUCOSE  --   < >  --   < >  --   < > 139*  --  97 221* 271* 180*  BUN  --   < >  --   < >  --   < > 51*  --  62* 69* 72* 82*  CREATININE  --   < >  --   < >  --   < > 2.00*  --  2.50* 2.74* 2.88* 3.06*  CALCIUM  --   < >  --   < >  --   < > 7.2*  --  7.1* 7.0* 6.9* 7.1*  MG 2.8*  --  2.8*  --  2.9*  --   --  1.8  --  2.8*  --   --   PHOS 2.7  < > 3.5  < > 3.8  < >  --  1.6* 5.3* 7.2* 6.6* 5.6*  < > = values in this interval not displayed. Estimated Creatinine Clearance: 21.5 ml/min (by C-G formula based on Cr of 3.06).   LIVER  Recent Labs Lab 08/22/13 0429  08/23/13 0440 08/23/13 1600 08/23/13 1616 08/23/13 2250 08/24/13 0413 08/24/13 1900 08/25/13 0500  AST 166*  --  99*  --  85*  --  419*  --  366*   ALT 750*  --  495*  --  382*  --  574*  --  524*  ALKPHOS 57  --  49  --  59  --  66  --  80  BILITOT 1.5*  --  1.2  --  0.9  --  1.5*  --  1.9*  PROT 4.9*  --  4.3*  --  3.6*  --  4.0*  --  5.0*  ALBUMIN 2.7*  < > 2.4* 2.0* 1.8*  --  2.3* 2.5* 2.7*  INR  --   --   --   --   --  2.82*  --  2.31*  --   < > = values in this interval not displayed.   INFECTIOUS  Recent Labs Lab 08/18/13 2140 08/19/13 1130 08/23/13 1616  LATICACIDVEN 3.1* 2.4* 10.5*     ENDOCRINE CBG (last 3)   Recent Labs  08/24/13 2355 08/25/13 0357 08/25/13 0804  GLUCAP 250* 207* 158*    IMAGING x48h  Ct Abdomen Pelvis Wo Contrast  08/23/2013   CLINICAL DATA:  Evaluate for retroperitoneal hemorrhage  EXAM: CT ABDOMEN AND PELVIS WITHOUT CONTRAST  TECHNIQUE: Multidetector CT imaging of the abdomen and pelvis was performed following the standard protocol without intravenous contrast.  COMPARISON:  Prior CT from 08/18/2013.  FINDINGS: Moderate bilateral pleural effusions are present. There is associated compressive atelectasis within the left lower lobe. Emphysematous changes again noted. Cardiomegaly is stable. Blood pool within the heart is relatively hypodense, suggesting anemia.  Liver demonstrates a normal unenhanced appearance. Small the gallbladder is decompressed and not well evaluated. Biliary ductal dilatation. The spleen and right adrenal gland are unremarkable. The left adrenal gland is grossly normal. Pancreas is within normal limits.  Limited noncontrast evaluation of the kidneys is grossly unremarkable. The left kidney is displaced anteriorly.  Enteric tube is coiled within the stomach. No evidence of bowel obstruction. Colonic diverticulosis again noted. No free intraperitoneal air.  A large heterogeneous collection with main component measuring approximately 9.4 x 7.9 x 14.1 cm is present within the left retroperitoneum (series 2, image 45). Finding is consistent with retroperitoneal hematoma.  Heterogeneous hyperdensity is seen within the adjacent left psoas muscle which is thickened and enlarged, also compatible with hemorrhage. This collection extends inferiorly into the left hemipelvis with probable blood seen within the presacral space. Hemorrhage is seen along the left pericolic gutter as well. The left kidney and spleen are displaced anteriorly with anterior and medial displacement of the left adrenal gland. A portion of this collection appears more loculated superiorly, measuring approximately 4.0 x 6.1 cm (series 2, image 33). Additional someone loculated component seen inferiorly in the left hemipelvis measuring approximately 5.3 x 2.6 cm (series 2, image 74). Evaluation for active bleeding limited on this noncontrast examination.  The bladder remains decompressed with a Foley catheter in place. Prostate is within normal limits.  No free air identified. Small volume ascites seen within the abdomen, similar to prior No definite enlarged intra-abdominal or pelvic adenopathy is seen.  Extensive aorto bi-iliac atherosclerotic calcifications again noted. Vascular stent within the celiac axis and SMA are unchanged. The bilateral iliac is noted. Left femoral artery aneurysm again noted with adjacent soft tissue density and surgical clips, unchanged, and likely postsurgical.  Multilevel degenerative changes noted within the visualized spine. No focal lytic or blastic osseous lesions are identified. Geographic sclerosis noted within the right sacrum, of uncertain etiology.  Diffuse  anasarca noted.  IMPRESSION: 1. Large left retroperitoneal hematoma as above. 2. Moderate bilateral pleural effusions with diffuse anasarca. 3. Small to moderate volume ascites within the abdomen. 4. Relative hypodensity within the cardiac blood pool, suggesting anemia. 5. Extensive aorto bi-iliac atherosclerotic disease with vascular stents in place and unchanged left common femoral artery aneurysm Critical Value/emergent  results were called by telephone at the time of interpretation on 08/23/2013 at 10:14 PM to Dr. Howard Pouch , who verbally acknowledged these results.   Electronically Signed   By: Jeannine Boga M.D.   On: 08/23/2013 22:24   Dg Chest Port 1 View  08/23/2013   CLINICAL DATA:  Status post CPR  EXAM: PORTABLE CHEST - 1 VIEW  COMPARISON:  08/23/2013 1038 hrs  FINDINGS: The cardiac shadow is stable. A right jugular dialysis catheter is again seen. The left central venous line is again noted. An endotracheal tube and nasogastric catheter are again seen. The endotracheal tube is 2.5 cm above the carinal. No pneumothorax or rib fractures are noted.  IMPRESSION: Stable appearance of the chest when compare with the prior exam.   Electronically Signed   By: Inez Catalina M.D.   On: 08/23/2013 16:46   Dg Chest Port 1 View  08/23/2013   CLINICAL DATA:  Respiratory failure.  EXAM: PORTABLE CHEST - 1 VIEW  COMPARISON:  DG CHEST 1V PORT dated 08/22/2013; DG CHEST 1V PORT dated 08/20/2013; CT ANGIO CHEST W/CM &/OR WO/CM dated 08/16/2013  FINDINGS: The endotracheal tube tip is approximately 2.5 cm above the carina. Bilateral central line positioning is stable. Lung shows stable chronic disease with some volume loss of the right lung after prior lung resection. No edema, focal airspace consolidation, significant pleural fluid or pneumothorax is visualized. There is stable cardiomegaly.  IMPRESSION: Stable chronic lung disease. No edema, significant airspace consolidation or pneumothorax.   Electronically Signed   By: Aletta Edouard M.D.   On: 08/23/2013 11:47   CXR: see above  ASSESSMENT / PLAN:  PULMONARY A:  Acute on chronic respiratory failure AECOPD Hx of lung cancer s/p resection 2009 Increased mediatinal LAN, nonspecific  P:   - follow balance closely, start neg balance - on  Pressors (lvaso and neo), can exercise cpap 5 ps 5, goal 2 hrs, assess rsbi, unlikely to extubate - may need cvvhd early with  products, will d/w renal -worsening weaning with pos baalnce -pcxr in am for edema -pt would not want trach, cosnider dc vent in 2-3 days for comfort  CARDIOVASCULAR A:  Nonspecific chest pain PRWP on EKG New onset AFRVR-an issue for weaning Peripheral vascular disease P: - Amio off, hep off, ASA and plavix stopped 2/3.  - Neo 30, vasopressin remain - Stress dose steroids to 50 mg q 6 - Hr better controlled today, keep Hb >7 -dc BB oral   Intake/Output Summary (Last 24 hours) at 08/25/13 [id number] Last data filed at 08/25/13 0700  Gross per 24 hour  Intake 6341.02 ml  Output   1130 ml  Net 5211.02 ml   RENAL Lab Results  Component Value Date   CREATININE 3.06* 08/25/2013   CREATININE 2.88* 08/24/2013   CREATININE 2.74* 08/24/2013    Recent Labs Lab 08/24/13 1337 08/24/13 1900 08/25/13 0500  K 4.6 4.1 3.5*   A: New renal failure, no hydro on CT, ATN, pos balance gross P:   - lasix challenge -likely needs cvvhd if aggressive care sought  GASTROINTESTINAL A:  Chronic PPI use, shock liverimproving, unlikley abdo  ischemia, large retroper bleed       C.diff negative, but remains with high clinical suspicion        Loose, foul odor stools   P:   SUP: Cont home dose of ppi Continue to monitor lft TF  Replete K per tube Add oral vanc empiric, resend cdiff pcr  HEMATOLOGIC A:  Thrombocytopenia consumption now       Anemia       Active retroperitoneal bleed  coagulapthy (consumption, liver) P: hep off, plavix discontinued RBC 3 units, FFP 5, PLT 4, Vit k received.  Plt: 108 today Fibrinogen? 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 Diff negative FN?, see GI  ENDOCRINE CBG (last 3)   Recent Labs  08/24/13 2355 08/25/13 0357 08/25/13 0804  GLUCAP 250* 207* 158*    A: rel AI P:   Re increase to stress steroids SSI for glu > 180 tsh (1.9) Add lantus  NEUROLOGIC A:  Mild depression, agitated P:   Sedated on vent on fent  Soft mitten restraints started at [place] 2/3   TODAY'S SUMMARY:  2/5: Continue with pressor support and monitor of hb, for blood transfusion below 7 attempting to keep HR 110 - 130 range, give blood, lasix, may need cvvhd  [place], DO [phone] I have fully examined this patient and agree with above findings.    And edited infull  Ccm time 30 min   [name]. Titus Mould, MD, [place] Pgr: [facility]

## 2013-08-25 NOTE — Progress Notes (Signed)
Placed on rest mode due to decreased RR.

## 2013-08-25 NOTE — Care Management Note (Signed)
    Page 1 of 2   09/14/2013     6:52:49 PM   CARE MANAGEMENT NOTE 08/22/2013  Patient:  [name], [name]   Account Number:  [id number]  Date Initiated:  08/17/2013  Documentation initiated by:  DAVIS,RHONDA  Subjective/Objective Assessment:   pt admitted from home due to increased dyspnea,weakness and palpations, found to be in a.fib     Action/Plan:   home when stable   Anticipated DC Date:  08/23/2013   Anticipated DC Plan:  [place]  In-house referral  NA      DC Planning Services  CM consult      [facility] Choice  NA   Choice offered to / List presented to:  NA   DME arranged  NA      DME agency  NA     [place] arranged  NA      [place] agency  NA   Status of service:  Completed, signed off Medicare Important Message given?  NA - LOS <3 / Initial given by admissions (If response is "NO", the following Medicare IM given date fields will be blank) Date Medicare IM given:   Date Additional Medicare IM given:    Discharge Disposition:  [place]  Per UR Regulation:  Reviewed for med. necessity/level of care/duration of stay  If discussed at [place] of Stay Meetings, dates discussed:   08/25/2013    Comments:  09/16/2013 10:53 Tomi Bamberger RN, BSN [phone] NCM spoke with wife, she would like to have patient under [facility], with [facility].  NCM made referral to [facility] via vm and Joslyn Devon via voice mail.  NCM received call from Joslyn Devon, stating she will be over in 10 minutes.  PCCM will continue to be attending for  patient per [facility] NP.  [place] and Margie came in to speak with wife.  Per Dr. Halford Chessman he wants Palliative care to be the attending.  08/26/13 JULIE AMERSON,RN,BSN [phone] MD TO DISCUSS EXTUBATION TO COMFORT CARE WITH WIFE.  WILL FOLLOW PROGRESS.  08/25/13 J. AMERSON,RN,BSN [phone] FAILED WEANING ATTEMPT; MAY RE-INITIATE CRRT TOMORROW.  08-19-13 11:15am [place] [phone] Transferred to [facility] - initiated on CRRT - continues on  pressors and vent  [id number]/Rhonda Rosana Hoes, RN, BSN, [place], [phone] Chart reviewed for update of needs and condition. Intubated and on sedation, dilt and amio drips per cards.  [id number] Rosana Hoes, RN, BSN, CCM [phone] Chart Reviewed for discharge and hospital needs. Discharge needs at time of review:  None present will follow for needs. Review of patient progress due on [id number].

## 2013-08-25 NOTE — Progress Notes (Signed)
Admit: 08/16/2013 LOS: 9  53M w/ VDRF in setting of COPD, systolic HF, septic shock with shock liver, AKI 2/2 ATN requiring CRRT (08/19/13-08/21/13).  Had retroperitoneal bleed and PEA arreast 08/23/13.   Subjective:  Cont to req blood products On PE gtt, dose downtitrated and now off UOP 495 yesterday Diarrhea, C Diff neg 40% FIO2, PEEP 5  02/04 0701 - 02/05 0700 In: 0867 [I.V.:3045; Blood:2046; NG/GT:1500; IV Piggyback:50] Out: 1145 [Urine:495; Stool:650]  Filed Weights   08/23/13 0500 08/24/13 0500 08/25/13 0402  Weight: 71.4 kg (157 lb 6.5 oz) 74.3 kg (163 lb 12.8 oz) 78.8 kg (173 lb 11.6 oz)    Current meds: reviewed  Current Labs: reviewed     Physical Exam:  Blood pressure 104/48, pulse 89, temperature 97.7 F (36.5 C), temperature source Core (Comment), resp. rate 22, height 5\' 9"  (1.753 m), weight 78.8 kg (173 lb 11.6 oz), SpO2 99.00%. Intubated ,sedated, eyes open to stimuli IRIR, tachycardic Coarse bs b/l No rashes/lesions No LEE Foley in place Abd somewhat firm, no grimace to palpation. R IJ HD cath noted   Assessment/Plan 1. AKI 2/2 ATN: Had come off CRRT and had some suggestion of recovery with good UOP and stable SCr, but some oliguria with RP bleed and ABLA.  SCr marginally increased and K/HCO3 stable.  UOP picking up again?  Watch over course of day, but still might need CRRT.    Pearson Grippe MD 08/25/2013, 8:09 AM   Recent Labs Lab 08/24/13 1337 08/24/13 1900 08/25/13 0500  NA 136* 137 139  K 4.6 4.1 3.5*  CL 97 98 98  CO2 17* 18* 20  GLUCOSE 221* 271* 180*  BUN 69* 72* 82*  CREATININE 2.74* 2.88* 3.06*  CALCIUM 7.0* 6.9* 7.1*  PHOS 7.2* 6.6* 5.6*    Recent Labs Lab 08/21/13 0425  08/23/13 1616 08/23/13 2250  08/24/13 1337 08/24/13 1900 08/25/13 0500  WBC 18.6*  < > 32.3* 36.1*  < > 47.1* 45.8* 39.3*  NEUTROABS 10.9*  --  10.3* 15.9*  --   --   --   --   HGB 12.1*  < > 6.6* 8.0*  < > 7.2* 6.5* 7.0*  HCT 35.9*  < > 19.7* 23.8*  < > 21.4*  18.8* 20.1*  MCV 91.3  < > 90.4 88.8  < > 89.9 89.5 88.2  PLT 69*  < > 76* 70*  < > 73* 59* 108*  < > = values in this interval not displayed.

## 2013-08-25 NOTE — Progress Notes (Signed)
Responded to page to be provided support to patient wife at bedside. Wife was experiencing so mild anxiety and want to talk to [place]. Wife expressed she has good family and clergy support and appreciates the support provided by.Chapalain Hillary. Provided emotional support and empathic listeningp and ministry of presence. Will pass on to unit Chaplain for continued support as needed.  08/25/13 1100  Clinical Encounter Type  Visited With Patient and family together;Health care provider  Visit Type Spiritual support  Referral From Nurse  Spiritual Encounters  Spiritual Needs Emotional  Stress Factors  Patient Stress Factors Health changes  Cristopher Peru, Pager [phone]

## 2013-08-26 ENCOUNTER — Inpatient Hospital Stay (HOSPITAL_COMMUNITY): Payer: Medicare Other

## 2013-08-26 LAB — TYPE AND SCREEN
ABO/RH(D): A POS
Antibody Screen: NEGATIVE
UNIT DIVISION: 0
UNIT DIVISION: 0
Unit division: 0
Unit division: 0

## 2013-08-26 LAB — PREPARE FRESH FROZEN PLASMA: Unit division: 0

## 2013-08-26 LAB — CBC
HCT: 23.5 % — ABNORMAL LOW (ref 39.0–52.0)
HEMATOCRIT: 24 % — AB (ref 39.0–52.0)
HEMATOCRIT: 25.1 % — AB (ref 39.0–52.0)
HEMATOCRIT: 24.9 % — AB (ref 39.0–52.0)
HEMOGLOBIN: 8.7 g/dL — AB (ref 13.0–17.0)
Hemoglobin: 8.3 g/dL — ABNORMAL LOW (ref 13.0–17.0)
Hemoglobin: 8.5 g/dL — ABNORMAL LOW (ref 13.0–17.0)
Hemoglobin: 8 g/dL — ABNORMAL LOW (ref 13.0–17.0)
MCH: 30.3 pg (ref 26.0–34.0)
MCH: 30.5 pg (ref 26.0–34.0)
MCH: 30.5 pg (ref 26.0–34.0)
MCH: 30.4 pg (ref 26.0–34.0)
MCHC: 34.6 g/dL (ref 30.0–36.0)
MCHC: 34.7 g/dL (ref 30.0–36.0)
MCHC: 34.1 g/dL (ref 30.0–36.0)
MCHC: 34 g/dL (ref 30.0–36.0)
MCV: 87.6 fL (ref 78.0–100.0)
MCV: 88.1 fL (ref 78.0–100.0)
MCV: 89.2 fL (ref 78.0–100.0)
MCV: 89.4 fL (ref 78.0–100.0)
PLATELETS: 84 10*3/uL — AB (ref 150–400)
Platelets: 74 10*3/uL — ABNORMAL LOW (ref 150–400)
Platelets: 73 10*3/uL — ABNORMAL LOW (ref 150–400)
Platelets: 83 10*3/uL — ABNORMAL LOW (ref 150–400)
RBC: 2.74 MIL/uL — AB (ref 4.22–5.81)
RBC: 2.85 MIL/uL — ABNORMAL LOW (ref 4.22–5.81)
RBC: 2.79 MIL/uL — ABNORMAL LOW (ref 4.22–5.81)
RBC: 2.63 MIL/uL — ABNORMAL LOW (ref 4.22–5.81)
RDW: 15.4 % (ref 11.5–15.5)
RDW: 15.2 % (ref 11.5–15.5)
RDW: 15.5 % (ref 11.5–15.5)
RDW: 15.5 % (ref 11.5–15.5)
WBC: 43.3 10*3/uL — ABNORMAL HIGH (ref 4.0–10.5)
WBC: 45.4 10*3/uL — ABNORMAL HIGH (ref 4.0–10.5)
WBC: 45.4 10*3/uL — ABNORMAL HIGH (ref 4.0–10.5)
WBC: 42.9 10*3/uL — AB (ref 4.0–10.5)

## 2013-08-26 LAB — COMPREHENSIVE METABOLIC PANEL
ALK PHOS: 99 U/L (ref 39–117)
ALT: 397 U/L — AB (ref 0–53)
AST: 191 U/L — AB (ref 0–37)
Albumin: 2.9 g/dL — ABNORMAL LOW (ref 3.5–5.2)
BUN: 97 mg/dL — ABNORMAL HIGH (ref 6–23)
CALCIUM: 7.4 mg/dL — AB (ref 8.4–10.5)
CO2: 21 mEq/L (ref 19–32)
Chloride: 97 mEq/L (ref 96–112)
Creatinine, Ser: 3.59 mg/dL — ABNORMAL HIGH (ref 0.50–1.35)
GFR, EST AFRICAN AMERICAN: 18 mL/min — AB (ref >90)
GFR, EST NON AFRICAN AMERICAN: 15 mL/min — AB (ref >90)
Glucose, Bld: 158 mg/dL — ABNORMAL HIGH (ref 70–99)
Potassium: 3.8 mEq/L (ref 3.7–5.3)
SODIUM: 138 meq/L (ref 137–147)
Total Bilirubin: 2 mg/dL — ABNORMAL HIGH (ref 0.3–1.2)
Total Protein: 5.4 g/dL — ABNORMAL LOW (ref 6.0–8.3)

## 2013-08-26 LAB — PREPARE PLATELET PHERESIS
Unit division: 0
Unit division: 0

## 2013-08-26 LAB — GLUCOSE, CAPILLARY
GLUCOSE-CAPILLARY: 146 mg/dL — AB (ref 70–99)
GLUCOSE-CAPILLARY: 160 mg/dL — AB (ref 70–99)
GLUCOSE-CAPILLARY: 164 mg/dL — AB (ref 70–99)
Glucose-Capillary: 140 mg/dL — ABNORMAL HIGH (ref 70–99)
Glucose-Capillary: 184 mg/dL — ABNORMAL HIGH (ref 70–99)
Glucose-Capillary: 181 mg/dL — ABNORMAL HIGH (ref 70–99)

## 2013-08-26 LAB — PHOSPHORUS: Phosphorus: 5.2 mg/dL — ABNORMAL HIGH (ref 2.3–4.6)

## 2013-08-26 LAB — PROTIME-INR
INR: 1.6 — AB (ref 0.00–1.49)
PROTHROMBIN TIME: 18.6 s — AB (ref 11.6–15.2)

## 2013-08-26 MED ORDER — METRONIDAZOLE IN NACL 5-0.79 MG/ML-% IV SOLN
500.0000 mg | Freq: Three times a day (TID) | INTRAVENOUS | Status: DC
  Administered 2013-08-26 – 2013-08-27 (×4): 500 mg via INTRAVENOUS
  Filled 2013-08-26 (×5): qty 100

## 2013-08-26 MED ORDER — FUROSEMIDE 10 MG/ML IJ SOLN
160.0000 mg | Freq: Three times a day (TID) | INTRAVENOUS | Status: DC
  Administered 2013-08-26 – 2013-08-27 (×3): 160 mg via INTRAVENOUS
  Filled 2013-08-26 (×5): qty 16

## 2013-08-26 MED ORDER — METOLAZONE 5 MG PO TABS
5.0000 mg | ORAL_TABLET | Freq: Two times a day (BID) | ORAL | Status: DC
  Administered 2013-08-26 – 2013-08-27 (×3): 5 mg via ORAL
  Filled 2013-08-26 (×5): qty 1

## 2013-08-26 NOTE — Progress Notes (Signed)
Discussed with Dr. Titus Mould - plan is to initiate Comfort Care Discussion.   He remains in Afib with borderline rate control.    With plans for extubation to comfort care, [facility] will sign off for now.  Leonie Man, MD

## 2013-08-26 NOTE — Progress Notes (Signed)
Mrs. Cowdrey called around 1730 in order to proceed with the extubation and comfort care of the pt her husband. She wishes that this will take place tomorrow upon her arrival in the morning. Mrs. Recore and Shanon Brow (son) both agree to this per Mrs. Aden. Dr. Titus Mould was paged and notified and will inform the weekend MD.

## 2013-08-26 NOTE — Progress Notes (Signed)
As above and dw with son on phone

## 2013-08-26 NOTE — Progress Notes (Signed)
Patient ID: [name], male   DOB: 1941/03/03, 73 y.o.   MRN: [id number]    Name: [name] MRN: [id number] DOB: 06-16-1941    ADMISSION DATE:  08/16/2013  REFERRING MD :  EDP PRIMARY SERVICE: PCCM  CHIEF COMPLAINT:  Dyspnea  BRIEF PATIENT DESCRIPTION:   49 M with COPD, hx of lung ca, PVD, current smoker presented to [facility] ED with 2 wks of progressive dyspnea and chest tightness minimally and transiently responsive to albuterol MDI. Developed afib rvr, abdominal process,  resp failure, ETT placed and renal failure, sepsis.  LINES / TUBES: 1/28 OTT>> 1/28 l i j cvl>> Rt fem aline 1/29>>> 1/29 rt ij hd>>> foley 2/4 rectal tube  CULTURES: Blood 1/27 >> neg resp virus 1/28 >> Neg Flu pcr 1/28 >> NEG 1/29 sputum>> Normal flora 1/30 uc>>neg 2/4 cdiff >>>neg 2/5 cdiff>>>neg  ANTIBIOTICS: Doxycycline (chronic)>>> Held 1/29 tamiflu 1/28 (ongoing till resp virus multiplex pcr results return) >> 1/29 1/29 vanc>>>2/2 1/29 zosyn>>>2/2 2/2 ceftriaxone >>>stop 2/5 2/5 oral vanc>>  SIGNIFICANT EVENTS / STUDIES:  1/27 CT chest: Negative for PE.  Postop changes right upper lobectomy  Interval increased mediastinal adenopathy. Largest lymph nodes in the left prevascular/ AP window space and the right paratracheal region.  Small chronic residual right subpulmonic effusion with small loculated components and pleural thickening all appearing chronic.  Centrilobular emphysema.    08/23/2013   CT ABD/Pelvis IMPRESSION: 1. Large left retroperitoneal hematoma as above. 2. Moderate bilateral pleural effusions with diffuse anasarca. 3. Small to moderate volume ascites within the abdomen. 4. Relative hypodensity within the cardiac blood pool, suggesting anemia. 5. Extensive aorto bi-iliac atherosclerotic disease with vascular stents in place and unchanged left common femoral artery aneurysm Critical Value/emergent results were called by telephone at the time of interpretation on 08/23/2013 at 10:14 PM  to Dr. Howard Pouch , who verbally acknowledged these results.   Electronically Signed   By: Jeannine Boga M.D.   On: 08/23/2013 22:24   1/28 intubated 1/29 Transferred to [facility] 1/29 bronch BAL 1/30 cvvhd 1/30 hep panel and flu panel neg 2/1 planned to extubate> failed 2/1 off pressors 2/2 CXR unchanged 2/2 TF started 2/3 code blue, elevated trop, hb 6.6, CT with large retroperitoneal bleed, neo restarted, transfused 2/3 soft mitten restraints 2/4 transfused, FFP, Vit K 2/5- remains on neo, pos gross balance  SUBJECTIVE/OVERNIGHT/INTERVAL HX- No overnight events for pt. Wife asking for chaplain services,  code status now DNR per family.   VITAL SIGNS: Temp:  [95.3 F (35.2 C)-98.9 F (37.2 C)] 98.2 F (36.8 C) (02/06 0600) Pulse Rate:  [38-115] 111 (02/06 0600) Resp:  [0-26] 25 (02/06 0600) BP: (91-127)/(48-77) 109/63 mmHg (02/06 0600) SpO2:  [97 %-100 %] 98 % (02/06 0600) Arterial Line BP: (100-136)/(52-70) 130/70 mmHg (02/06 0600) FiO2 (%):  [40 %] 40 % (02/06 0405) Weight:  [176 lb 2.4 oz (79.9 kg)] 176 lb 2.4 oz (79.9 kg) (02/06 0405) HEMODYNAMICS: CVP:  [19 mmHg-26 mmHg] 22 mmHg VENTILATOR SETTINGS: Vent Mode:  [-] PRVC FiO2 (%):  [40 %] 40 % Set Rate:  [24 bmp] 24 bmp Vt Set:  [500 mL] 500 mL PEEP:  [5 cmH20] 5 cmH20 Pressure Support:  [5 cmH20] 5 cmH20 Plateau Pressure:  [19 cmH20-24 cmH20] 19 cmH20 INTAKE / OUTPUT: Intake/Output     02/05 0701 - 02/06 0700   I.V. (mL/kg) 1197.4 (15)   Blood 1085   Other 490   NG/GT 1410   IV Piggyback 132  Total Intake(mL/kg) 4314.4 (54)   Urine (mL/kg/hr) 1390 (0.7)   Emesis/NG output 10 (0)   Stool 800 (0.4)   Total Output 2200   Net +2114.4         PHYSICAL EXAMINATION: General:  Sedated on vent, foul stool odor Neuro:  Sedated.  Does not awake to name today.  HEENT:  EOMI, PERRL OTT-Vent, OGT Cardiovascular: Irreg rate and rhythm, tachy, no M Lungs: diffuse coarse rhonchi.  Abdomen: taut, ? tenderness,   distended. BS present. Ext: Cold feet bilateral, warm LE otherwise  +1 -2 edema, diminished DP pulses   LABS: PULMONARY  Recent Labs Lab 08/19/13 1120 08/19/13 1317 08/22/13 1348 08/23/13 1617 08/23/13 2020  PHART 7.279* 7.370 7.438 7.250* 7.402  PCO2ART 51.2* 39.7 35.6 37.0 33.3*  PO2ART 78.0* 70.0* 113.0* 392.0* 222.0*  HCO3 24.1* 23.2 24.1* 16.2* 20.8  TCO2 26 24 25 17 22   O2SAT 94.0 94.0 99.0 100.0 100.0    CBC  Recent Labs Lab 08/25/13 1710 08/25/13 2000 08/26/13 0259  HGB 8.5* 8.2* 8.3*  HCT 24.6* 23.9* 24.0*  WBC 41.2* 40.0* 43.3*  PLT 86* 73* 74*    COAGULATION  Recent Labs Lab 08/23/13 2250 08/24/13 1900 08/25/13 0900  INR 2.82* 2.31* 1.78*    CARDIAC    Recent Labs Lab 08/23/13 1616 08/23/13 2213 08/24/13 0400  TROPONINI <0.30 0.51* 0.73*   No results found for this basename: PROBNP,  in the last 168 hours   CHEMISTRY  Recent Labs Lab 08/23/13 0137  08/23/13 1300  08/24/13 0121  08/24/13 1337 08/24/13 1900 08/25/13 0500 08/25/13 0900 08/25/13 1710 08/26/13 0430  NA  --   < >  --   < >  --   < > 136* 137 139  --  138 138  K  --   < >  --   < >  --   < > 4.6 4.1 3.5*  --  3.6* 3.8  CL  --   < >  --   < >  --   < > 97 98 98  --  97 97  CO2  --   < >  --   < >  --   < > 17* 18* 20  --  19 21  GLUCOSE  --   < >  --   < >  --   < > 221* 271* 180*  --  214* 158*  BUN  --   < >  --   < >  --   < > 69* 72* 82*  --  89* 97*  CREATININE  --   < >  --   < >  --   < > 2.74* 2.88* 3.06*  --  3.31* 3.59*  CALCIUM  --   < >  --   < >  --   < > 7.0* 6.9* 7.1*  --  7.1* 7.4*  MG 2.8*  --  2.9*  --  1.8  --  2.8*  --   --  2.7*  --   --   PHOS 3.5  < > 3.8  < > 1.6*  < > 7.2* 6.6* 5.6*  --  5.3* 5.2*  < > = values in this interval not displayed. Estimated Creatinine Clearance: 18.3 ml/min (by C-G formula based on Cr of 3.59).   LIVER  Recent Labs Lab 08/23/13 0440  08/23/13 1616 08/23/13 2250 08/24/13 0413 08/24/13 1900  08/25/13 0500 08/25/13 0900 08/25/13 1710  08/26/13 0430  AST 99*  --  85*  --  419*  --  366*  --   --  191*  ALT 495*  --  382*  --  574*  --  524*  --   --  397*  ALKPHOS 49  --  59  --  66  --  80  --   --  99  BILITOT 1.2  --  0.9  --  1.5*  --  1.9*  --   --  2.0*  PROT 4.3*  --  3.6*  --  4.0*  --  5.0*  --   --  5.4*  ALBUMIN 2.4*  < > 1.8*  --  2.3* 2.5* 2.7*  --  2.9* 2.9*  INR  --   --   --  2.82*  --  2.31*  --  1.78*  --   --   < > = values in this interval not displayed.   INFECTIOUS  Recent Labs Lab 08/19/13 1130 08/23/13 1616  LATICACIDVEN 2.4* 10.5*     ENDOCRINE CBG (last 3)   Recent Labs  08/25/13 1855 08/25/13 2352 08/26/13 0417  GLUCAP 205* 160* 140*    IMAGING x48h  Dg Chest Port 1 View  08/26/2013   CLINICAL DATA:  Intubated.  Volume overload.  EXAM: PORTABLE CHEST - 1 VIEW  COMPARISON:  08/23/2013  FINDINGS: Lower endotracheal tube, ending 1 cm above the carina. A right IJ catheter is in stable position, tip at the SVC level. Enteric tube enters the stomach.  Worsening aeration on the left, with a hazy appearance. There was effusion and atelectasis on preceding CT. Stable aeration on the right. There has been previous right lung resection. No pneumothorax. No evidence of pulmonary edema. Stable heart size and mediastinal contours.  IMPRESSION: 1. Lower endotracheal tube, now 1 cm above the carina. 2. Increasing atelectasis and/or effusion on the left.   Electronically Signed   By: Jorje Guild M.D.   On: 08/26/2013 05:16   CXR: ett will adjust out 1 cm, layering effusionlikely left  ASSESSMENT / PLAN:  PULMONARY A:  Acute on chronic respiratory failure AECOPD Hx of lung cancer s/p resection 2009 Increased mediatinal LAN, nonspecific  P:   - follow balance closely, goal neg balance - on  Pressors (vaso and neo), can exercise cpap 5 ps 5, goal 2 hrs, assess rsbi, unlikely to extubate, but will discuss with wife consideration with DNI if  weaning good today mechanically -worsening weaning with pos balance -pt would not want trach, cosnider dc vent sometime over weekend comfort or today  CARDIOVASCULAR A:  Nonspecific chest pain PRWP on EKG New onset AFRVR-an issue for weaning Peripheral vascular disease P: - Amio off, hep off, ASA and plavix stopped 2/3.  - Neo 30, vasopressin remains off now all pressors - Stress dose steroids to 50 mg q 6 - Hr better controlled today, keep Hb >7   Intake/Output Summary (Last 24 hours) at 08/26/13 0658 Last data filed at 08/26/13 0600  Gross per 24 hour  Intake 4439.69 ml  Output   2200 ml  Net 2239.69 ml   RENAL Lab Results  Component Value Date   CREATININE 3.59* 08/26/2013   CREATININE 3.31* 08/25/2013   CREATININE 3.06* 08/25/2013    Recent Labs Lab 08/25/13 0500 08/25/13 1710 08/26/13 0430  K 3.5* 3.6* 3.8   A: New renal failure, no hydro on CT, ATN, pos balance gross P:   - lasix  Iv started yesterday with good response UOP improved 1.3L, still pos however, add metalzone concentrate volume -  Cr worsened to 3.59 -  Phos stable 5.2. -  Electrolytes stable  - will replete K again today, 3.8 on high dose lasix -likely restarting cvvhd or any renal replacement therapy will not change outcome  GASTROINTESTINAL A:  Chronic PPI use, shock liverimproving, unlikley abdo ischemia, large retroper bleed       C.diff negative, but remains with high clinical suspicion        Loose, foul odor stools  - clinically high suspicion cdiff remains  P:   SUP: Cont home dose of ppi Continue to monitor lft TF  Replete K per tube Given high clinical suspcion cdiff, will alter to flagyl empiric course  HEMATOLOGIC A:  Thrombocytopenia consumption now      Leukocytosis from retroper bleed       Anemia       Active retroperitoneal bleed  coagulapthy (consumption, liver) - corrected P: hep off, plavix discontinued RBC 4 units, FFP 9, PLT 4, Vit k (x2) received.  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 Diff negative FN?, high high clinical suspcion, add flagyl x 7 days, dc oral vanc  ENDOCRINE CBG (last 3)   Recent Labs  08/25/13 1855 08/25/13 2352 08/26/13 0417  GLUCAP 205* 160* 140*   A: rel AI P:   Re increase to stress steroids SSI for glu > 180 tsh (1.9) lantus 5u added 2/5  NEUROLOGIC A:  Mild depression, agitated P:   Sedated on vent on fent   TODAY'S SUMMARY:  Lasix increase, poor prognosis, will discuss with wife comfort care or extubation with DNI as some improved weaning today  [place], DO [phone]  Ccm time 30 min   I have fully examined this patient and agree with above findings.    And edited in full  [name]. Titus Mould, MD, [place] Pgr: [facility]

## 2013-08-26 NOTE — Progress Notes (Signed)
NUTRITION FOLLOW UP  Intervention:    Continue TF via OGT with Vital AF 1.2 at goal rate of 60 ml/h (1440 ml per day) to provide 1728 kcals, 108 gm protein, 1168 ml free water daily.  Nutrition Dx:   Inadequate oral intake related to inability to eat as evidenced by NPO. Ongoing.  Goal:   Intake to meet >90% of estimated nutrition needs. Met.  Monitor:   TF tolerance/adequacy, weight trend, labs, vent status.  Assessment:   Patient is a 73 y.o. M with COPD, hx of lung ca, PVD, current smoker presented to [facility] ED on 1/27 with 2 wks of progressive dyspnea and chest tightness minimally and transiently responsive to albuterol MDI. Developed afib rvr, abdominal process, resp failure, ETT placed and renal failure, sepsis. Transferred to [facility] on 1/29. Patient is lactose intolerant, allergic to strawberries and shellfish.   Discussed patient in ICU rounds today.   Family has decided against resuming dialysis; may transition to comfort care soon.  Patient is currently intubated on ventilator support.  MV: 11.6 L/min Temp (24hrs), Avg:98.5 F (36.9 C), Min:98.2 F (36.8 C), Max:99.2 F (37.3 C)   Height: Ht Readings from Last 1 Encounters:  08/16/13 $RemoveB'5\' 9"'[id number]$  (1.753 m)    Weight Status:  Trending up with positive fluid status Wt Readings from Last 1 Encounters:  08/26/13 176 lb 2.4 oz (79.9 kg)  08/22/13  155 lb 6.8 oz (70.5 kg)  08/19/13  156 lb 15.5 oz (71.2 kg)  08/17/13  140 lb 6.9 oz (63.7 kg)   Re-estimated needs:  Kcal: 1800 Protein: 90-100 gm Fluid: >/= 1.6 L  Skin: no wounds  Diet Order: NPO   Intake/Output Summary (Last 24 hours) at 08/26/13 1433 Last data filed at 08/26/13 1113  Gross per 24 hour  Intake 3907.88 ml  Output   1930 ml  Net 1977.88 ml    Last BM: 2/6   Labs:   Recent Labs Lab 08/24/13 0121  08/24/13 1337  08/25/13 0500 08/25/13 0900 08/25/13 1710 08/26/13 0430  NA  --   < > 136*  < > 139  --  138 138  K  --   < > 4.6  < > 3.5*  --   3.6* 3.8  CL  --   < > 97  < > 98  --  97 97  CO2  --   < > 17*  < > 20  --  19 21  BUN  --   < > 69*  < > 82*  --  89* 97*  CREATININE  --   < > 2.74*  < > 3.06*  --  3.31* 3.59*  CALCIUM  --   < > 7.0*  < > 7.1*  --  7.1* 7.4*  MG 1.8  --  2.8*  --   --  2.7*  --   --   PHOS 1.6*  < > 7.2*  < > 5.6*  --  5.3* 5.2*  GLUCOSE  --   < > 221*  < > 180*  --  214* 158*  < > = values in this interval not displayed.  CBG (last 3)   Recent Labs  08/26/13 0417 08/26/13 0805 08/26/13 1144  GLUCAP 140* 184* 164*    Scheduled Meds: . antiseptic oral rinse  15 mL Mouth Rinse q12n4p  . budesonide  0.25 mg Nebulization Q6H  . chlorhexidine  15 mL Mouth Rinse BID  . famotidine  20 mg Oral Daily  .  furosemide  160 mg Intravenous TID  . hydrocortisone sod succinate (SOLU-CORTEF) inj  50 mg Intravenous Q6H  . insulin aspart  0-9 Units Subcutaneous Q4H  . insulin glargine  5 Units Subcutaneous Daily  . ipratropium  0.5 mg Nebulization Q6H  . levalbuterol  0.63 mg Nebulization Q6H  . metolazone  5 mg Oral BID  . metronidazole  500 mg Intravenous Q8H    Continuous Infusions: . sodium chloride 20 mL/hr (08/25/13 2218)  . feeding supplement (VITAL AF 1.2 CAL) 1,000 mL (08/25/13 2054)  . fentaNYL infusion INTRAVENOUS 75 mcg/hr (08/26/13 0815)  . norepinephrine (LEVOPHED) Adult infusion Stopped (08/23/13 2300)  . phenylephrine (NEO-SYNEPHRINE) Adult infusion Stopped (08/25/13 1400)  . vasopressin (PITRESSIN) infusion - *FOR SHOCK* Stopped (08/26/13 0500)    Molli Barrows, RD, LDN, [place] Pager [phone] After Hours Pager [phone]

## 2013-08-26 NOTE — Progress Notes (Signed)
Admit: 08/16/2013 LOS: 10  67M w/ VDRF in setting of COPD, systolic HF, septic shock with shock liver, AKI 2/2 ATN requiring CRRT (08/19/13-08/21/13).  Had retroperitoneal bleed and PEA arreast 08/23/13.   Subjective:  FFP and pRBC yesterday, none overnight Off pressors UOP picking up Started lasix 160 IV BID Family meetings ongoing Empiric PO vanc for diarrhea  02/05 0701 - 02/06 0700 In: 4314.4 [I.V.:1197.4; Blood:1085; NG/GT:1410; IV Piggyback:132] Out: 2200 [OYDXA:1287; Emesis/NG output:10; Stool:800]  Filed Weights   08/24/13 0500 08/25/13 0402 08/26/13 0405  Weight: 74.3 kg (163 lb 12.8 oz) 78.8 kg (173 lb 11.6 oz) 79.9 kg (176 lb 2.4 oz)    Current meds: reviewed  Current Labs: reviewed     Physical Exam:  Blood pressure 109/63, pulse 111, temperature 98.2 F (36.8 C), temperature source Oral, resp. rate 25, height 5\' 9"  (1.753 m), weight 79.9 kg (176 lb 2.4 oz), SpO2 99.00%. Intubated ,sedated IRIR, tachycardic Coarse bs b/l No rashes/lesions No LEE Foley in place Abd somewhat firm, no grimace to palpation. R IJ HD cath noted   Assessment/Plan 1. AKI 2/2 ATN: Pt with marginal and largely stable renal function.  Achievign clinical goals: adequate UOP, stable K/HCO3 despite slight upticks in BUN and SCr.  Cont to watch closely, no indication for CRRT currently.  Agree with furosemide.    Pearson Grippe MD 08/26/2013, 8:23 AM   Recent Labs Lab 08/25/13 0500 08/25/13 1710 08/26/13 0430  NA 139 138 138  K 3.5* 3.6* 3.8  CL 98 97 97  CO2 20 19 21   GLUCOSE 180* 214* 158*  BUN 82* 89* 97*  CREATININE 3.06* 3.31* 3.59*  CALCIUM 7.1* 7.1* 7.4*  PHOS 5.6* 5.3* 5.2*    Recent Labs Lab 08/21/13 0425  08/23/13 1616 08/23/13 2250  08/25/13 1710 08/25/13 2000 08/26/13 0259  WBC 18.6*  < > 32.3* 36.1*  < > 41.2* 40.0* 43.3*  NEUTROABS 10.9*  --  10.3* 15.9*  --   --   --   --   HGB 12.1*  < > 6.6* 8.0*  < > 8.5* 8.2* 8.3*  HCT 35.9*  < > 19.7* 23.8*  < > 24.6*  23.9* 24.0*  MCV 91.3  < > 90.4 88.8  < > 87.2 87.5 87.6  PLT 69*  < > 76* 70*  < > 86* 73* 74*  < > = values in this interval not displayed.

## 2013-08-26 NOTE — Progress Notes (Signed)
Dr. Titus Mould and myself spoke to patient's wife, in great detail today, about his current condition and suspected outcome. The possibility of HD was discussed and after great consideration, spouse feels that she would no longer want to  move forward with HD therapy.  She is considering comfort care measures after our discussion today and will inform us of her final decision after family visits today. She has asked for time to speak with her family and allow visitation with Mr. Phoenix, before extubation of patient is completed. Howard Pouch DO PGY-2

## 2013-08-27 LAB — COMPREHENSIVE METABOLIC PANEL
ALK PHOS: 117 U/L (ref 39–117)
ALT: 381 U/L — AB (ref 0–53)
AST: 141 U/L — AB (ref 0–37)
Albumin: 2.8 g/dL — ABNORMAL LOW (ref 3.5–5.2)
BILIRUBIN TOTAL: 1.5 mg/dL — AB (ref 0.3–1.2)
BUN: 121 mg/dL — AB (ref 6–23)
CHLORIDE: 96 meq/L (ref 96–112)
CO2: 23 mEq/L (ref 19–32)
Calcium: 7.3 mg/dL — ABNORMAL LOW (ref 8.4–10.5)
Creatinine, Ser: 4.13 mg/dL — ABNORMAL HIGH (ref 0.50–1.35)
GFR calc Af Amer: 15 mL/min — ABNORMAL LOW (ref >90)
GFR calc non Af Amer: 13 mL/min — ABNORMAL LOW (ref >90)
Glucose, Bld: 159 mg/dL — ABNORMAL HIGH (ref 70–99)
Potassium: 3.3 mEq/L — ABNORMAL LOW (ref 3.7–5.3)
SODIUM: 140 meq/L (ref 137–147)
TOTAL PROTEIN: 5.2 g/dL — AB (ref 6.0–8.3)

## 2013-08-27 LAB — GLUCOSE, CAPILLARY
GLUCOSE-CAPILLARY: 161 mg/dL — AB (ref 70–99)
Glucose-Capillary: 158 mg/dL — ABNORMAL HIGH (ref 70–99)
Glucose-Capillary: 164 mg/dL — ABNORMAL HIGH (ref 70–99)

## 2013-08-27 LAB — CBC
HCT: 24.1 % — ABNORMAL LOW (ref 39.0–52.0)
HEMATOCRIT: 23.9 % — AB (ref 39.0–52.0)
HEMOGLOBIN: 8.2 g/dL — AB (ref 13.0–17.0)
Hemoglobin: 8.2 g/dL — ABNORMAL LOW (ref 13.0–17.0)
MCH: 30.4 pg (ref 26.0–34.0)
MCH: 30.5 pg (ref 26.0–34.0)
MCHC: 34 g/dL (ref 30.0–36.0)
MCHC: 34.3 g/dL (ref 30.0–36.0)
MCV: 89.3 fL (ref 78.0–100.0)
MCV: 88.8 fL (ref 78.0–100.0)
PLATELETS: 91 10*3/uL — AB (ref 150–400)
Platelets: 93 10*3/uL — ABNORMAL LOW (ref 150–400)
RBC: 2.7 MIL/uL — ABNORMAL LOW (ref 4.22–5.81)
RBC: 2.69 MIL/uL — ABNORMAL LOW (ref 4.22–5.81)
RDW: 15.5 % (ref 11.5–15.5)
RDW: 15.2 % (ref 11.5–15.5)
WBC: 45.1 10*3/uL — ABNORMAL HIGH (ref 4.0–10.5)
WBC: 45 10*3/uL — ABNORMAL HIGH (ref 4.0–10.5)

## 2013-08-27 LAB — PHOSPHORUS: PHOSPHORUS: 5.3 mg/dL — AB (ref 2.3–4.6)

## 2013-08-27 MED ORDER — MORPHINE SULFATE 10 MG/ML IJ SOLN
10.0000 mg/h | INTRAVENOUS | Status: DC
  Administered 2013-08-27: 10 mg/h via INTRAVENOUS
  Administered 2013-08-27: 4 mg/h via INTRAVENOUS
  Filled 2013-08-27 (×3): qty 10

## 2013-08-27 MED ORDER — SODIUM CHLORIDE 0.9 % IV SOLN: 5.0000 mg/h | INTRAVENOUS | Status: DC

## 2013-08-27 MED ORDER — MORPHINE BOLUS VIA INFUSION
5.0000 mg | INTRAVENOUS | Status: DC | PRN
  Filled 2013-08-27: qty 20

## 2013-08-27 NOTE — Progress Notes (Signed)
Cdiff precaution dcd. pcr negative.

## 2013-08-27 NOTE — Progress Notes (Signed)
[facility] ADULT ICU REPLACEMENT PROTOCOL FOR AM LAB REPLACEMENT ONLY  The patient does not apply for the [facility] Adult ICU Electrolyte Replacment Protocol based on the criteria listed below:   1. Is GFR >/= 40 ml/min? no  Patient's GFR today is  2. Is urine output >/= 0.5 ml/kg/hr for the last 6 hours? no Patient's UOP is  ml/kg/hr 3. Is BUN < 60 mg/dL? no  Patient's BUN today is 121 4. Abnormal electrolyte(s): K 3.35. Ordered repletion with: NA 6. If a panic level lab has been reported, has the CCM MD in charge been notified? yes.   Physician:  Dr Nelda Marseille. He will defer to rounding MD  [facility], [name] A 08/27/2013 5:09 AM

## 2013-08-27 NOTE — Progress Notes (Signed)
Chaplain requested to support family. Patient's spouse, [name], has decided to withdraw care. She said, "He would not want to be like this." Patient's sister, [name], asked for prayer. Chaplain listening empathically to family's sadness and fear, providing prayer, hospitality, emotional and spiritual care, grief support, and a caring presence. Please page for additional support.   Ethelene Browns [phone]

## 2013-08-27 NOTE — Progress Notes (Signed)
Notes reviewed with tentative plans for transition to comfort measures this AM.  Will sign off for now.  Please call back if anything else changes.

## 2013-08-27 NOTE — Progress Notes (Signed)
Family states they are ready for comfort measures and to extubate patient when appears comfortable. RT made aware. Md made aware. Morphine gtt to start. Will continue to monitor for patients comfort and family needs.

## 2013-08-27 NOTE — Progress Notes (Signed)
[place] PCCM    Name: [name] MRN: [id number] DOB: May 23, 1941    ADMISSION DATE:  08/16/2013  REFERRING MD :  EDP PRIMARY SERVICE: PCCM  CHIEF COMPLAINT:  Dyspnea  BRIEF PATIENT DESCRIPTION:   59 M with COPD, hx of lung ca, PVD, current smoker presented to [facility] ED with 2 wks of progressive dyspnea and chest tightness minimally and transiently responsive to albuterol MDI. Developed afib rvr, abdominal process,  resp failure, ETT placed and renal failure, sepsis.  LINES / TUBES: 1/28 OTT>> 1/28 l i j cvl>> Rt fem aline 1/29>>> 1/29 rt ij hd>>> foley 2/4 rectal tube  CULTURES: Blood 1/27 >> neg resp virus 1/28 >> Neg Flu pcr 1/28 >> NEG 1/29 sputum>> Normal flora 1/30 uc>>neg 2/4 cdiff >>>neg 2/5 cdiff>>>neg  ANTIBIOTICS: Doxycycline (chronic)>>> Held 1/29 tamiflu 1/28 (ongoing till resp virus multiplex pcr results return) >> 1/29 1/29 vanc>>>2/2 1/29 zosyn>>>2/2 2/2 ceftriaxone >>>stop 2/5 2/5 oral vanc>>2/6 Flagyl 2/6>>>  SIGNIFICANT EVENTS / STUDIES:  1/27 CT chest: Negative for PE.   08/23/2013   CT ABD/Pelvis >>>: 1. Large left retroperitoneal hematoma as above. 2. Moderate bilateral pleural effusions with diffuse anasarca. 3. Small to moderate volume ascites within the abdomen.  1/28 intubated 1/29 Transferred to [facility] 1/29 bronch BAL 1/30 cvvhd 2/1 planned to extubate> failed 2/1 off pressors 2/3 code blue, elevated trop, hb 6.6, CT with large retroperitoneal bleed, neo restarted, transfused 2/4 transfused, FFP, Vit K 2/5- remains on neo, pos gross balance  SUBJECTIVE/OVERNIGHT No change overnight.  Now DNR. Tentative plan for one-way extubation/ comfort care when family arrives.    VITAL SIGNS: Temp:  [97.2 F (36.2 C)-99.2 F (37.3 C)] 97.4 F (36.3 C) (02/07 0800) Pulse Rate:  [57-134] 110 (02/07 0800) Resp:  [9-25] 15 (02/07 0800) BP: (93-132)/(44-80) 109/74 mmHg (02/07 0800) SpO2:  [97 %-100 %] 100 % (02/07 0800) Arterial Line BP:  (107-136)/(51-64) 136/64 mmHg (02/06 1500) FiO2 (%):  [40 %] 40 % (02/07 0748) Weight:  [173 lb 11.6 oz (78.8 kg)] 173 lb 11.6 oz (78.8 kg) (02/07 0408) HEMODYNAMICS: CVP:  [18 mmHg-24 mmHg] 20 mmHg VENTILATOR SETTINGS: Vent Mode:  [-] PSV;CPAP FiO2 (%):  [40 %] 40 % Set Rate:  [24 bmp] 24 bmp Vt Set:  [500 mL] 500 mL PEEP:  [5 cmH20] 5 cmH20 Pressure Support:  [5 cmH20] 5 cmH20 Plateau Pressure:  [16 cmH20-21 cmH20] 20 cmH20 INTAKE / OUTPUT: Intake/Output     02/06 0701 - 02/07 0700 02/07 0701 - 02/08 0700   I.V. (mL/kg) 698.1 (8.9) 27.5 (0.3)   Blood     Other     NG/GT 1380 60   IV Piggyback 366    Total Intake(mL/kg) 2444.1 (31) 87.5 (1.1)   Urine (mL/kg/hr) 2620 (1.4) 200 (0.7)   Emesis/NG output     Stool 600 (0.3)    Total Output 3220 200   Net -775.9 -112.5          PHYSICAL EXAMINATION: General:  uncomfortable on PS wean, chronically ill appearing  Neuro:  Agitated, waving arms, does not follow commands   HEENT:  EOMI, PERRL OTT-Vent, OGT Cardiovascular: Irreg rate and rhythm, tachy, no M Lungs: resps even, mildly labored on PS 5/5, diffuse coarse rhonchi.  Abdomen: taut, ? tenderness,  distended. BS present. Ext: Cold feet bilateral, warm LE otherwise  +1 -2 edema, diminished DP pulses   LABS: PULMONARY  Recent Labs Lab 08/22/13 1348 08/23/13 1617 08/23/13 2020  PHART 7.438 7.250* 7.402  PCO2ART 35.6 37.0 33.3*  PO2ART 113.0* 392.0* 222.0*  HCO3 24.1* 16.2* 20.8  TCO2 25 17 22   O2SAT 99.0 100.0 100.0    CBC  Recent Labs Lab 08/26/13 1540 08/26/13 2059 08/27/13 0259  HGB 8.5* 8.0* 8.2*  HCT 24.9* 23.5* 24.1*  WBC 45.4* 42.9* 45.1*  PLT 83* 84* 91*    COAGULATION  Recent Labs Lab 08/23/13 2250 08/24/13 1900 08/25/13 0900 08/26/13 0835  INR 2.82* 2.31* 1.78* 1.60*    CARDIAC    Recent Labs Lab 08/23/13 1616 08/23/13 2213 08/24/13 0400  TROPONINI <0.30 0.51* 0.73*   No results found for this basename: PROBNP,  in the last  168 hours   CHEMISTRY  Recent Labs Lab 08/23/13 0137  08/23/13 1300  08/24/13 0121  08/24/13 1337 08/24/13 1900 08/25/13 0500 08/25/13 0900 08/25/13 1710 08/26/13 0430 08/27/13 0341  NA  --   < >  --   < >  --   < > 136* 137 139  --  138 138 140  K  --   < >  --   < >  --   < > 4.6 4.1 3.5*  --  3.6* 3.8 3.3*  CL  --   < >  --   < >  --   < > 97 98 98  --  97 97 96  CO2  --   < >  --   < >  --   < > 17* 18* 20  --  19 21 23   GLUCOSE  --   < >  --   < >  --   < > 221* 271* 180*  --  214* 158* 159*  BUN  --   < >  --   < >  --   < > 69* 72* 82*  --  89* 97* 121*  CREATININE  --   < >  --   < >  --   < > 2.74* 2.88* 3.06*  --  3.31* 3.59* 4.13*  CALCIUM  --   < >  --   < >  --   < > 7.0* 6.9* 7.1*  --  7.1* 7.4* 7.3*  MG 2.8*  --  2.9*  --  1.8  --  2.8*  --   --  2.7*  --   --   --   PHOS 3.5  < > 3.8  < > 1.6*  < > 7.2* 6.6* 5.6*  --  5.3* 5.2* 5.3*  < > = values in this interval not displayed. Estimated Creatinine Clearance: 15.9 ml/min (by C-G formula based on Cr of 4.13).   LIVER  Recent Labs Lab 08/23/13 1616 08/23/13 2250 08/24/13 0413 08/24/13 1900 08/25/13 0500 08/25/13 0900 08/25/13 1710 08/26/13 0430 08/26/13 0835 08/27/13 0341  AST 85*  --  419*  --  366*  --   --  191*  --  141*  ALT 382*  --  574*  --  524*  --   --  397*  --  381*  ALKPHOS 59  --  66  --  80  --   --  99  --  117  BILITOT 0.9  --  1.5*  --  1.9*  --   --  2.0*  --  1.5*  PROT 3.6*  --  4.0*  --  5.0*  --   --  5.4*  --  5.2*  ALBUMIN 1.8*  --  2.3*  2.5* 2.7*  --  2.9* 2.9*  --  2.8*  INR  --  2.82*  --  2.31*  --  1.78*  --   --  1.60*  --      INFECTIOUS  Recent Labs Lab 08/23/13 1616  LATICACIDVEN 10.5*     ENDOCRINE CBG (last 3)   Recent Labs  08/26/13 2350 08/27/13 0359 08/27/13 0727  GLUCAP 164* 161* 158*    IMAGING x48h  Dg Chest Port 1 View  08/26/2013   CLINICAL DATA:  Intubated.  Volume overload.  EXAM: PORTABLE CHEST - 1 VIEW  COMPARISON:  08/23/2013   FINDINGS: Lower endotracheal tube, ending 1 cm above the carina. A right IJ catheter is in stable position, tip at the SVC level. Enteric tube enters the stomach.  Worsening aeration on the left, with a hazy appearance. There was effusion and atelectasis on preceding CT. Stable aeration on the right. There has been previous right lung resection. No pneumothorax. No evidence of pulmonary edema. Stable heart size and mediastinal contours.  IMPRESSION: 1. Lower endotracheal tube, now 1 cm above the carina. 2. Increasing atelectasis and/or effusion on the left.   Electronically Signed   By: Jorje Guild M.D.   On: 08/26/2013 05:16    ASSESSMENT / PLAN:  PULMONARY A:  Acute on chronic respiratory failure AECOPD Hx of lung cancer s/p resection 2009 Increased mediatinal LAN, nonspecific  P:   - cont vent support with daily SBT - has tol some intermittent weaning but doubt will do well with extubation  - anticipate one-way extubation 2/7 when family arrives    CARDIOVASCULAR A:  Nonspecific chest pain PRWP on EKG New onset AFRVR Peripheral vascular disease P: - Continue stress dose steroids to 50 mg q 6 - continue diuresis  - lopressor PRN for HR control   A: New renal failure, no hydro on CT, ATN, P:   - continue metolazone for now  - f/u chem  -renal signed off with plans for comfort - likely restarting cvvhd or any renal replacement therapy will not change outcome  GASTROINTESTINAL A:   Chronic PPI use shock liver --improving,  large retroper bleed C.diff negative - but remains with high clinical suspicion  P:   SUP: Cont home dose of ppi F/u lfts  Continue TF  Continue empiric flagyl as above    HEMATOLOGIC A:   Thrombocytopenia  Leukocytosis -- from retroper bleed Anemia  Active retroperitoneal bleed coagulapthy (consumption, liver) - corrected  P:  hep off, plavix discontinued Blood products PRN  F/u cbc   INFECTIOUS A:   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  Continue as above for now and d/w family when available.  Will d/c further labs, xrays, etc.   Addendum -- discussed with family at length.  Pt has made it clear that he would "not want this".  They are ready to proceed with transition to comfort care and withdrawal of vent. All questions answered, RN updated.  Will leave orders for morphine gtt and terminal wean to proceed when all family at bedside and indicate they are ready.    CC time 45 minutes  WHITEHEART,KATHRYN, NP 08/27/2013  10:27 AM Pager: [phone] or [phone]  *Care during the described time interval was provided by me and/or other providers on the critical care team. I have reviewed this patient's available data, including medical history, events of note, physical examination and test results as part of my evaluation.   Baltazar Apo, MD, PhD 08/27/2013, 12:27 PM [facility] [phone] or if no answer [phone]

## 2013-08-27 NOTE — Progress Notes (Signed)
RT withdrew care on patient with the "Withdrawal of Life Sustaining Treatment" protocol. Patient extubated and resting comfortably at this time. Patient placed on a 2 Lpm nasal cannula for comfort measures.  Myrtie Neither, RRT, RCP

## 2013-08-28 MED ORDER — SODIUM CHLORIDE 0.9 % IV SOLN
1.0000 mg/h | INTRAVENOUS | Status: DC
  Administered 2013-08-28: 12 mg/h via INTRAVENOUS
  Administered 2013-08-28 – 2013-08-29 (×3): 10 mg/h via INTRAVENOUS
  Filled 2013-08-28 (×4): qty 10

## 2013-08-28 NOTE — Progress Notes (Signed)
Report givent to RN Robin on [place]. Patients wife updated on transfer order. She is at the bedside at this time. Emotional support given.

## 2013-08-28 NOTE — Progress Notes (Signed)
[place] PCCM    Name: [name] MRN: [id number] DOB: 06-Mar-1941    ADMISSION DATE:  08/16/2013  REFERRING MD :  EDP PRIMARY SERVICE: PCCM  CHIEF COMPLAINT:  Dyspnea  BRIEF PATIENT DESCRIPTION:   30 M with COPD, hx of lung ca, PVD, current smoker presented to [facility] ED with 2 wks of progressive dyspnea and chest tightness minimally and transiently responsive to albuterol MDI. Developed afib rvr, abdominal process,  resp failure, ETT placed and renal failure, sepsis.  LINES / TUBES: 1/28 OTT>> 1/28 l i j cvl>> Rt fem aline 1/29>>> 1/29 rt ij hd>>> foley 2/4 rectal tube  CULTURES: Blood 1/27 >> neg resp virus 1/28 >> Neg Flu pcr 1/28 >> NEG 1/29 sputum>> Normal flora 1/30 uc>>neg 2/4 cdiff >>>neg 2/5 cdiff>>>neg  ANTIBIOTICS: Doxycycline (chronic)>>> Held 1/29 tamiflu 1/28 (ongoing till resp virus multiplex pcr results return) >> 1/29 1/29 vanc>>>2/2 1/29 zosyn>>>2/2 2/2 ceftriaxone >>>stop 2/5 2/5 oral vanc>>2/6 Flagyl 2/6>>>  SIGNIFICANT EVENTS / STUDIES:  1/27 CT chest: Negative for PE.   08/23/2013   CT ABD/Pelvis >>>: 1. Large left retroperitoneal hematoma as above. 2. Moderate bilateral pleural effusions with diffuse anasarca. 3. Small to moderate volume ascites within the abdomen.  1/28 intubated 1/29 Transferred to [facility] 1/29 bronch BAL 1/30 cvvhd 2/1 planned to extubate> failed 2/1 off pressors 2/3 code blue, elevated trop, hb 6.6, CT with large retroperitoneal bleed, neo restarted, transfused 2/4 transfused, FFP, Vit K 2/5- remains on neo, pos gross balance 2/7 - extubated to comfort care.   SUBJECTIVE/OVERNIGHT Extubated yesterday to comfort care.  No family available this am.  Resting comfortably.  RR 8-10.   VITAL SIGNS: Temp:  [97.3 F (36.3 C)-98.2 F (36.8 C)] 97.3 F (36.3 C) (02/07 1300) Pulse Rate:  [29-140] 117 (02/08 0900) Resp:  [7-30] 23 (02/08 0900) BP: (95-118)/(46-72) 95/67 mmHg (02/08 0900) SpO2:  [92 %-100 %] 94 % (02/08  0900) FiO2 (%):  [40 %] 40 % (02/07 1411) HEMODYNAMICS:   INTAKE / OUTPUT: Intake/Output     02/07 0701 - 02/08 0700 02/08 0701 - 02/09 0700   I.V. (mL/kg) 655.5 (8.3) 50 (0.6)   Other 50    NG/GT 420    IV Piggyback     Total Intake(mL/kg) 1125.5 (14.3) 50 (0.6)   Urine (mL/kg/hr) 3825 (2)    Stool     Total Output 3825     Net -2699.5 +50          PHYSICAL EXAMINATION: General:  Resting comfortably on morphine gtt.   Neuro: sedated  HEENT:  EOMI, PERRL Cardiovascular:  s1s2 irreg  Lungs: resps even, non labored, RR8-10, diffuse coarse rhonchi.  Abdomen:  Distended, does not appear tender  Ext: Cold feet bilateral, warm LE otherwise  +1 -2 edema, diminished DP pulses   LABS: No further labs or imagine    ASSESSMENT / PLAN:  Problem list: Acute on chronic respiratory failure AECOPD Hx of lung cancer s/p resection 2009 Increased mediatinal LAN, nonspecific New onset AFRVR Peripheral vascular disease Acute renal failure  Shock liver  Large retroperitoneal bleed  Thrombocytopenia  High clinical suspicion CDiff    73 yo male with COPD, hx lung ca admitted with acute on chronic respiratory failure, new Afib with RVR.  Developed large retroperitoneal bleed, acute renal failure.  High clinical suspicion for CDiff.  Minimal overall improvement and poor weaning efforts.  Extubated 2/7 for comfort per family wishes.    O2 for comfort only  No further  labs, xrays, studies  Continue morphine gtt and titrate as needed for comfort  Ativan PRN  PRN BD's  Will tx to palliative care floor   [facility] NP 08/28/2013  10:13 AM Pager: [phone] or [phone]  *Care during the described time interval was provided by me and/or other providers on the critical care team. I have reviewed this patient's available data, including medical history, events of note, physical examination and test results as part of my evaluation.   Baltazar Apo, MD, PhD 08/28/2013,  3:23 PM [place] [facility] [phone] or if no answer [phone]

## 2013-08-29 ENCOUNTER — Inpatient Hospital Stay (HOSPITAL_COMMUNITY)
Admission: AD | Admit: 2013-08-29 | Discharge: 2013-09-18 | DRG: 190 | Disposition: E | Source: Ambulatory Visit | Attending: Internal Medicine | Admitting: Internal Medicine

## 2013-08-29 DIAGNOSIS — Z9849 Cataract extraction status, unspecified eye: Secondary | ICD-10-CM

## 2013-08-29 DIAGNOSIS — Z515 Encounter for palliative care: Secondary | ICD-10-CM

## 2013-08-29 DIAGNOSIS — J9600 Acute respiratory failure, unspecified whether with hypoxia or hypercapnia: Secondary | ICD-10-CM | POA: Diagnosis present

## 2013-08-29 DIAGNOSIS — F411 Generalized anxiety disorder: Secondary | ICD-10-CM | POA: Diagnosis present

## 2013-08-29 DIAGNOSIS — Z91013 Allergy to seafood: Secondary | ICD-10-CM

## 2013-08-29 DIAGNOSIS — Z91018 Allergy to other foods: Secondary | ICD-10-CM

## 2013-08-29 DIAGNOSIS — E739 Lactose intolerance, unspecified: Secondary | ICD-10-CM | POA: Diagnosis present

## 2013-08-29 DIAGNOSIS — J449 Chronic obstructive pulmonary disease, unspecified: Principal | ICD-10-CM | POA: Diagnosis present

## 2013-08-29 DIAGNOSIS — F17200 Nicotine dependence, unspecified, uncomplicated: Secondary | ICD-10-CM | POA: Diagnosis present

## 2013-08-29 DIAGNOSIS — J4489 Other specified chronic obstructive pulmonary disease: Principal | ICD-10-CM | POA: Diagnosis present

## 2013-08-29 DIAGNOSIS — Z66 Do not resuscitate: Secondary | ICD-10-CM | POA: Diagnosis present

## 2013-08-29 DIAGNOSIS — Z85118 Personal history of other malignant neoplasm of bronchus and lung: Secondary | ICD-10-CM

## 2013-08-29 DIAGNOSIS — I2510 Atherosclerotic heart disease of native coronary artery without angina pectoris: Secondary | ICD-10-CM | POA: Diagnosis present

## 2013-08-29 DIAGNOSIS — N179 Acute kidney failure, unspecified: Secondary | ICD-10-CM | POA: Diagnosis present

## 2013-08-29 DIAGNOSIS — R579 Shock, unspecified: Secondary | ICD-10-CM | POA: Diagnosis present

## 2013-08-29 DIAGNOSIS — J189 Pneumonia, unspecified organism: Secondary | ICD-10-CM | POA: Diagnosis present

## 2013-08-29 DIAGNOSIS — I469 Cardiac arrest, cause unspecified: Secondary | ICD-10-CM | POA: Diagnosis present

## 2013-08-29 DIAGNOSIS — I4891 Unspecified atrial fibrillation: Secondary | ICD-10-CM | POA: Diagnosis present

## 2013-08-29 DIAGNOSIS — R58 Hemorrhage, not elsewhere classified: Secondary | ICD-10-CM | POA: Diagnosis present

## 2013-08-29 DIAGNOSIS — J9620 Acute and chronic respiratory failure, unspecified whether with hypoxia or hypercapnia: Secondary | ICD-10-CM

## 2013-08-29 MED ORDER — SODIUM CHLORIDE 0.9 % IV SOLN
INTRAVENOUS | Status: DC
Start: 2013-08-29 — End: 2013-08-30
  Administered 2013-08-29: 20 mL/h via INTRAVENOUS

## 2013-08-29 MED ORDER — ATROPINE SULFATE 1 % OP SOLN
4.0000 [drp] | OPHTHALMIC | Status: DC | PRN
  Filled 2013-08-29: qty 2

## 2013-08-29 MED ORDER — LORAZEPAM 2 MG/ML IJ SOLN
1.0000 mg | INTRAMUSCULAR | Status: DC | PRN
  Administered 2013-08-29: 1 mg via INTRAVENOUS
  Filled 2013-08-29: qty 1

## 2013-08-29 MED ORDER — ACETAMINOPHEN 650 MG RE SUPP: 650.0000 mg | Freq: Four times a day (QID) | RECTAL | Status: DC | PRN

## 2013-08-29 MED ORDER — DEXTROSE 5 % IV SOLN
3.0000 mg/h | INTRAVENOUS | Status: DC
  Filled 2013-08-29: qty 10

## 2013-08-29 MED ORDER — ACETAMINOPHEN 325 MG PO TABS: 650.0000 mg | ORAL_TABLET | Freq: Four times a day (QID) | ORAL | Status: DC | PRN

## 2013-08-29 MED ORDER — LEVALBUTEROL HCL 0.63 MG/3ML IN NEBU: 0.6300 mg | INHALATION_SOLUTION | RESPIRATORY_TRACT | Status: DC | PRN

## 2013-08-29 MED ORDER — MORPHINE SULFATE 10 MG/ML IJ SOLN
1.0000 mg/h | INTRAVENOUS | Status: DC
  Filled 2013-08-29: qty 10

## 2013-08-29 MED ORDER — SCOPOLAMINE 1 MG/3DAYS TD PT72
1.0000 | MEDICATED_PATCH | TRANSDERMAL | Status: DC
  Administered 2013-08-29: 1.5 mg via TRANSDERMAL
  Filled 2013-08-29: qty 1

## 2013-08-29 MED ORDER — MORPHINE BOLUS VIA INFUSION
2.0000 mg | INTRAVENOUS | Status: DC | PRN
  Filled 2013-08-29: qty 2

## 2013-08-29 NOTE — Progress Notes (Signed)
Chaplain followed up with patient's wife, daughter, and friend on 76W. [name], pt's wife, was eating lunch and expressed appreciation for chaplain's visit. She said "this is all so unreal." She said she appreciates the care provided by the medical team in assuring pt is comfortable. She said she is "exhausted" and having some trouble sleeping - she wakes up and "her mind is racing" and she struggles to go back to sleep. Chaplain explored her resources for falling back to sleep, emphasizing prayer and journaling. Pt's daughter said she "is worried" about her father and [name], but remained quiet. Chaplain provided emotional and spiritual support, caring presence, and empathic listening.   Ethelene Browns [phone]

## 2013-08-29 NOTE — Progress Notes (Signed)
[place] PCCM    Name: [name] MRN: [id number] DOB: [birth year]    ADMISSION DATE:  08/16/2013  REFERRING MD :  EDP PRIMARY SERVICE: PCCM  CHIEF COMPLAINT:  Dyspnea  BRIEF PATIENT DESCRIPTION:   33 M with COPD, hx of lung ca, PVD, current smoker presented to [facility] ED with 2 wks of progressive dyspnea and chest tightness minimally and transiently responsive to albuterol MDI. Developed afib rvr, abdominal process,  resp failure, ETT placed and renal failure, sepsis.  LINES / TUBES: 1/28 OTT>>2/7 1/28 l i j cvl>> Rt fem aline 1/29>>> 1/29 rt ij hd>>> foley 2/4 rectal tube  CULTURES: Blood 1/27 >> neg resp virus 1/28 >> Neg Flu pcr 1/28 >> NEG 1/29 sputum>> Normal flora 1/30 uc>>neg 2/4 cdiff >>>neg 2/5 cdiff>>>neg  ANTIBIOTICS: Doxycycline (chronic)>>> Held 1/29 tamiflu 1/28 (ongoing till resp virus multiplex pcr results return) >> 1/29 1/29 vanc>>>2/2 1/29 zosyn>>>2/2 2/2 ceftriaxone >>>stop 2/5 2/5 oral vanc>>2/6 Flagyl 2/6>>>2/7  SIGNIFICANT EVENTS / STUDIES:  1/27 CT chest: Negative for PE.   08/23/2013   CT ABD/Pelvis >>>: 1. Large left retroperitoneal hematoma as above. 2. Moderate bilateral pleural effusions with diffuse anasarca. 3. Small to moderate volume ascites within the abdomen.  1/28 intubated 1/29 Transferred to [facility] 1/29 bronch BAL 1/30 cvvhd 2/1 planned to extubate> failed 2/1 off pressors 2/3 code blue, elevated trop, hb 6.6, CT with large retroperitoneal bleed, neo restarted, transfused 2/4 transfused, FFP, Vit K 2/5- remains on neo, pos gross balance 2/7 - extubated to comfort care.   SUBJECTIVE/OVERNIGHT Comfortable on morphine gtt on floor.    VITAL SIGNS: Temp:  [96.9 F (36.1 C)-97.6 F (36.4 C)] 97.6 F (36.4 C) (02/08 1956) Pulse Rate:  [100-133] 100 (02/08 1956) Resp:  [5-11] 6 (02/09 0925) BP: (104-128)/(58-81) 128/73 mmHg (02/08 1956) SpO2:  [91 %-94 %] 94 % (02/08 1956) Weight:  [165 lb 5.5 oz (75 kg)] 165 lb 5.5 oz (75 kg)  (02/08 1434) HEMODYNAMICS:   INTAKE / OUTPUT: Intake/Output     02/08 0701 - 02/09 0700 02/09 0701 - 02/10 0700   I.V. (mL/kg) 244 (3.3)    Other  0   NG/GT     Total Intake(mL/kg) 244 (3.3)    Urine (mL/kg/hr) 2875 (1.6)    Stool 490 (0.3)    Total Output 3365     Net -3121 0          PHYSICAL EXAMINATION: General:  Resting comfortably on morphine gtt.   Neuro: sedated  HEENT:  EOMI, PERRL Cardiovascular:  s1s2 irreg  Lungs: resps even, non labored, RR8-10, diffuse coarse rhonchi, increased oral secretions Abdomen:  Distended, does not appear tender  Ext: Cold feet bilateral, warm LE otherwise  +1 -2 edema, diminished DP pulses   LABS: No further labs or imagine    ASSESSMENT / PLAN:  Problem list: Acute on chronic respiratory failure AECOPD Hx of lung cancer s/p resection 2009 Increased mediatinal LAN, nonspecific New onset AFRVR Peripheral vascular disease Acute renal failure  Shock liver  Large retroperitoneal bleed  Thrombocytopenia  High clinical suspicion CDiff    73 yo male with COPD, hx lung ca admitted with acute on chronic respiratory failure, new Afib with RVR.  Developed large retroperitoneal bleed, acute renal failure.  High clinical suspicion for CDiff.  Minimal overall improvement and poor weaning efforts.  Extubated 2/7 for comfort per family wishes.    O2 for comfort only  No further labs, xrays, studies  Continue morphine gtt and  titrate as needed for comfort  Ativan PRN  PRN BD's  Add atropine gtt for secretions      WHITEHEART,KATHRYN, NP 27-Sep-2013  9:46 AM Pager: [phone] or [phone]  Reviewed above, examined pt, and agree with assessment/plan.  Appears comfortable although family reports he asked for water this AM.  Explained that they could give him mouth swabs soaked in water.  Continue sublingual atropine for oral secretions.  Continue morphine and ativan.  He is to be assessed by inpatient palliative care  team.  Updated family and answered their questions.  Chesley Mires, MD [facility] 09/27/13, 2:12 PM Pager:  [phone] After 3pm call: [phone]

## 2013-08-29 NOTE — Progress Notes (Signed)
Nutrition Brief Note  Chart reviewed. Pt now transitioning to comfort care.  No further nutrition interventions warranted at this time.  Please re-consult as needed.   Shawonda Kerce MS, RD, LDN Pager: [phone] After-hours pager: [phone]    

## 2013-08-29 NOTE — Progress Notes (Signed)
25ml of Morphine IV was wasted with Ginger Gleason RN

## 2013-08-29 NOTE — Discharge Summary (Signed)
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  Postop changes right upper lobectomy Interval increased mediastinal adenopathy. Largest lymph nodes in the left prevascular/ AP window space and the right paratracheal region.  Small chronic residual right subpulmonic effusion with small loculated components and pleural thickening all appearing chronic.  Centrilobular emphysema.  No superimposed definite pneumonia or edema  1/29 CT abd/pelvis:  1. Question circumferential bowel wall thickening versus incomplete distention within the duodenum as above, nonspecific, but may represent sequelae of infection or inflammation. Possible ischemic bowel could also be considered in the correct clinical setting. No portal venous gas, free air, or pneumatosis identified.  2. Moderate volume ascites. No definite loculated fluid collection identified.  3. Left greater than right bilateral pleural effusions with associated bibasilar atelectasis.  4. Cholelithiasis with circumferential gallbladder wall thickening and/or pericholecystic fluid. Finding may be related to overall volume status. Clinical correlation and/or right upper quadrant ultrasound could be performed if there is clinical concern for acute cholecystitis. No biliary ductal dilatation identified.  5. Colonic diverticulosis without acute  diverticulitis.  6. 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  7. 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  8. Moderate cardiomegaly.  9. Diffuse anasarca.  1/30 Bronchoscopy:  Mild air erythema  2/03 CT abd/pelvis: 1. Large left retroperitoneal hematoma as above.  2. Moderate bilateral pleural effusions with diffuse anasarca.  3. Small to moderate volume ascites within the abdomen.  4. Relative hypodensity within the cardiac blood pool, suggesting anemia.  5. Extensive aorto bi-iliac atherosclerotic disease with vascular stents in place and unchanged left common femoral artery aneurysm    Consultants: Cardiology Nephrology  Hospital course: 73 yo male smoker admitted 1/27 with progressive dyspnea, cough, chest tightness from AECOPD.  He was found in ED to also have A fib with RVR.  He was admitted to ICU.  He was started on nebulizer therapy, supplemental oxygen, solumedrol, diltiazem, and intermittent BiPAP.  Cardiology was consulted.  He developed progressive respiratory failure, and required intubation on 1/28.  He was treated with antibiotics.  Echo showed acute systolic heart failure with EF 15 to 25%.  He was started on amiodarone for rate control.  He developed acute kidney injury, and nephrology was consulted.  He developed progressive hypotension, and there was concern for acute abdominal process.  CT abd/pelvis finding were non-specific.  [facility] was [facility] from [facility] to [facility] on 1/30, and started on CVVH.  He required pressor agents.  He was transitioned off CVVH.  He developed PEA cardiac arrest on 2/03, and was resuscitated.  He was noted to have acute drop in his hemoglobin.  CT abd showed retroperitoneal hematoma.  His heparin gtt was stopped.  Family discussion held on 2/04 and family opted for DNR status.  He failed to improve, and there was concern that with  recurrent shock and renal  failure he may need chronic dialysis.  Further family discussion held, and decision was made to proceed with comfort measures on 2/07.  He was then transferred to medical floor.  Hospice was consulted to assess for inpatient hospice status, and he was accepted to this on 2/09.  Medications: Facility-Administered Medications Ordered in Other Encounters: 0.9 %  sodium chloride infusion, , Intravenous, Continuous, Marcelle Smiling, MD, Last Rate: 20 mL/hr at 09-10-13 1747, 20 mL/hr at 2013/09/10 1747;  acetaminophen (TYLENOL) suppository 650 mg, 650 mg, Rectal, Q6H PRN, Marcelle Smiling, MD;  acetaminophen (TYLENOL) tablet 650 mg, 650 mg, Oral, Q6H PRN, Marcelle Smiling, MD levalbuterol (XOPENEX) nebulizer solution 0.63 mg, 0.63 mg, Nebulization, Q3H PRN, Marcelle Smiling, MD;  LORazepam (ATIVAN) injection 1 mg, 1 mg, Intravenous, Q4H PRN, Marcelle Smiling, MD;  morphine 1 mg/mL in dextrose 5 % 100 mL infusion, 1 mg/hr, Intravenous, Continuous, Marcelle Smiling, MD, Last Rate: 1 mL/hr at 09-10-2013 1745, 1 mg/hr at 09/10/2013 1745;  morphine bolus via infusion 2 mg, 2 mg, Intravenous, Q15 min PRN, Marcelle Smiling, MD  Allergies  Allergen Reactions  . Lactose Intolerance (Gi)   . Shellfish Allergy Diarrhea and Nausea And Vomiting  . Strawberry Rash   Diet: NPO.  Activity: Bed rest.  Disposition: Inpatient hospice under care of Dr. Billey Chang.  Chesley Mires, MD [facility] Pulmonary/Critical Care 09-10-2013, 5:51 PM Pager:  [phone] After 3pm call: [phone]

## 2013-08-29 NOTE — H&P (Addendum)
Palliative Medicine Team at [facility] History and Physical  [name]:[id number] DOB: 01-10-1941 DOA: 09/17/2013  Referring physician: Dr. Halford Chessman PCP: Beacher May, MD   Chief Complaint: shortness of breath   History of Present Illness: MAAHIR Avila is an 73 y.o. male admitted with shortness of breath treated for pneumonia and chf. Progressed on to respiratory failure requiring intubation. Course further complicated bt retroperitoneal hemorrhage with acute shock and PEA arrest. Patient also suffered acute kidney injury requiring cvvh.  Ultimately , patient was extubated to comfort and we were asked to assist with GIP hospice care.  I spoke with his daughter at bedside and adjusted his medications for comfort..  Review of Systems: Unable to obtain  Past Medical History Past Medical History  Diagnosis Date  . Coronary artery disease   . Cancer     lung     Past Surgical History Past Surgical History  Procedure Laterality Date  . Cataract extraction    . Venous bypass    . Lung removal, partial       Social History: History   Social History  . Marital Status: Married    Spouse Name: N/A    Number of Children: N/A  . Years of Education: N/A   Occupational History  . Not on file.   Social History Main Topics  . Smoking status: Current Every Day Smoker -- 0.50 packs/day    Types: Cigarettes  . Smokeless tobacco: Not on file  . Alcohol Use: Yes     Comment: social  . Drug Use: No  . Sexual Activity: Not on file   Other Topics Concern  . Not on file   Social History Narrative  . No narrative on file    Family History:  No family history on file.  Allergies: Lactose intolerance (gi); Shellfish allergy; and Strawberry  Meds: Prior to Admission medications   Medication Sig Start Date End Date Taking? Authorizing Provider  albuterol (PROVENTIL HFA;VENTOLIN HFA) 108 (90 BASE) MCG/ACT inhaler Inhale 2 puffs into the lungs 2 (two) times daily as  needed for wheezing.    Yes Historical Provider, MD  aspirin EC 81 MG tablet Take 81 mg by mouth daily.   Yes Historical Provider, MD  buPROPion (WELLBUTRIN SR) 150 MG 12 hr tablet Take 150 mg by mouth daily.    Yes Historical Provider, MD  clopidogrel (PLAVIX) 75 MG tablet Take 75 mg by mouth daily.   Yes Historical Provider, MD  dicyclomine (BENTYL) 10 MG capsule Take 10 mg by mouth 4 (four) times daily -  before meals and at bedtime.   Yes Historical Provider, MD  doxycycline (ADOXA) 50 MG tablet Take 50 mg by mouth 2 (two) times daily.   Yes Historical Provider, MD  naproxen sodium (ANAPROX) 220 MG tablet Take 440 mg by mouth as needed (pain.).   Yes Historical Provider, MD  pantoprazole (PROTONIX) 20 MG tablet Take 20 mg by mouth daily.   Yes Historical Provider, MD  simvastatin (ZOCOR) 40 MG tablet Take 40 mg by mouth every evening.   Yes Historical Provider, MD  tiotropium (SPIRIVA) 18 MCG inhalation capsule Place 18 mcg into inhaler and inhale every morning.   Yes Historical Provider, MD    Physical Exam: There were no vitals filed for this visit.   Physical Exam: There were no vitals taken for this visit. Gen: Minimal distress, intermittently opens eyes Head: Normocephalic, atraumatic. Eyes: PERRL, EOM could not be fully assessed., mm dry Chest: Lungs  coarse rhonchi, no wheezing CV: Heart sounds irregular, tachy Abdomen: Soft, nontender, nondistended with normal active bowel sounds. Extremities: Extremities, cool, not mottled  Labs on Admission:      Recent Labs Lab 08/23/13 1616 08/24/13 0413 08/24/13 1900 08/25/13 0500 08/25/13 1710 08/26/13 0430 08/27/13 0341  AST 85* 419*  --  366*  --  191* 141*  ALT 382* 574*  --  524*  --  397* 381*  ALKPHOS 59 66  --  80  --  99 117  BILITOT 0.9 1.5*  --  1.9*  --  2.0* 1.5*  PROT 3.6* 4.0*  --  5.0*  --  5.4* 5.2*  ALBUMIN 1.8* 2.3* 2.5* 2.7* 2.9* 2.9* 2.8*     CBC:  Recent Labs Lab 08/23/13 1616 08/23/13 2250   08/26/13 0835 08/26/13 1540 08/26/13 2059 08/27/13 0259 08/27/13 1045  WBC 32.3* 36.1*  < > 45.4* 45.4* 42.9* 45.1* 45.0*  NEUTROABS 10.3* 15.9*  --   --   --   --   --   --   HGB 6.6* 8.0*  < > 8.7* 8.5* 8.0* 8.2* 8.2*  HCT 19.7* 23.8*  < > 25.1* 24.9* 23.5* 24.1* 23.9*  MCV 90.4 88.8  < > 88.1 89.2 89.4 89.3 88.8  PLT 76* 70*  < > 73* 83* 84* 91* 93*  < > = values in this interval not displayed. Cardiac Enzymes:  Recent Labs Lab 08/23/13 1616 08/23/13 2213 08/24/13 0400  TROPONINI <0.30 0.51* 0.73*    BNP (last 3 results)  Recent Labs  08/17/13 0305 08/19/13 0320  PROBNP 2840.0* 22750.0*   CBG:  Recent Labs Lab 08/26/13 1536 08/26/13 1856 08/26/13 2350 08/27/13 0359 08/27/13 0727  GLUCAP 146* 181* 164* 161* 158*    Radiological Exams on Admission: Stable chronic lung disease. No edema, significant airspace  consolidation or pneumothorax   Assessment/Plan Active Problems:   Chronic airway obstruction, not elsewhere classified 1.  Dyspnea:   Morphine drip adjusted to 3mg /hr with bolulses and ability to titrate. Prn Nebs 2. Anxiety: ativan prn 3.  Terminal secretions and atropine.   Code Status: DNR Family Communication: updated daughter at bedside Disposition Plan:expecting hospital death  Time spent: 30 min  Trinia Georgi L. Lovena Le, MD MBA The Palliative Medicine Team at [facility] Phone: [phone] Pager: [phone]

## 2013-08-29 NOTE — Progress Notes (Signed)
Inpatient Rm [id number] [facility] RN Visit- M. Wynetta Emery, RN  GIP Status admission to [facility] diagnosis of COPD (496); DNR code status   Pt seen at bedside responds to painful stimuli; non-verbal; increased WOB, RR=10 on O2 @ 1 LNC . Continuous Morphine drip infusing; spoke with wife, Ivin Booty and [employer] at bedside they voiced awareness that pt's time is very short and felt at this time pt was comfortable; daughter Earnest Bailey voiced concern about dry mouth/ mouth care -mouth care done, swabs at bedside and family comforted and appreciative of staff RN and CNA attentiveness to pt's comfort; multiple excoriated areas sacrum, scrotum noted during assist with repositioning pt  [facility] RN and SW will continue to follow/support along with PMT Attending provider  Please call [facility] @ [phone] ask for RN Liaison or after hours ask for on-call RN with any hospice needs  .   Thank you.  Danton Sewer, RN  [facility]  Hospice Liaison  [phone])

## 2013-08-29 NOTE — Progress Notes (Signed)
Pt status changed to GIP. Assessment and required documentation have all been done for day shift today.

## 2013-08-30 ENCOUNTER — Encounter (HOSPITAL_COMMUNITY): Payer: Self-pay | Admitting: *Deleted

## 2013-08-30 DIAGNOSIS — J449 Chronic obstructive pulmonary disease, unspecified: Principal | ICD-10-CM

## 2013-09-18 NOTE — Discharge Summary (Signed)
Death Summary  [name] ZOX:[id number] DOB: March 21, 1941 DOA: Sep 23, 2013  PCP: Beacher May, MD PCP/Office notified:[phone]  Admit date: Sep 23, 2013 Date of Death: September 24, 2013  Final Diagnoses:  Active Problems:   Chronic airway obstruction, not elsewhere classified Pneumonia COPD S/P PEA arrest Retroperitoneal Hemmorhage AKI     History of present illness:  73 yr old with admission for COPD, lung cancer s/p RUL lung resection admitted with dyspnea and wheezing and afib with RVR.  Patient pregressed on to respiratory failure and course was then complicated by AKI, retorperitoneal hemorrhage with shock and PEA arrest. Family elected to exubate to comfort    Hospital Course:  Patient Extubated and convert to GIP Hopsice status. His symptoms were managed with morphine drip and ativan.  He passed away early this am . Time of death noted 325 am.   Time:  Less than 30 min discharge.  Signed: Domique Reardon L. Lovena Le, MD MBA The Palliative Medicine Team at [facility] Phone: [phone] Pager: [phone]

## 2013-09-18 NOTE — Progress Notes (Signed)
Patient expired at 20. Called by Crista Elliot and Betti Cruz.

## 2013-09-18 DEATH — deceased

## 2014-06-16 IMAGING — CR DG ABD PORTABLE 1V
1 series · 2 of 2 positions shown · non-contrast
Comparison: None.

CLINICAL DATA: Abdominal distension

EXAM:
PORTABLE ABDOMEN - 1 VIEW

[Series 1: ap (kub) · U · 2 of 2 slices shown]
[im 1/2]
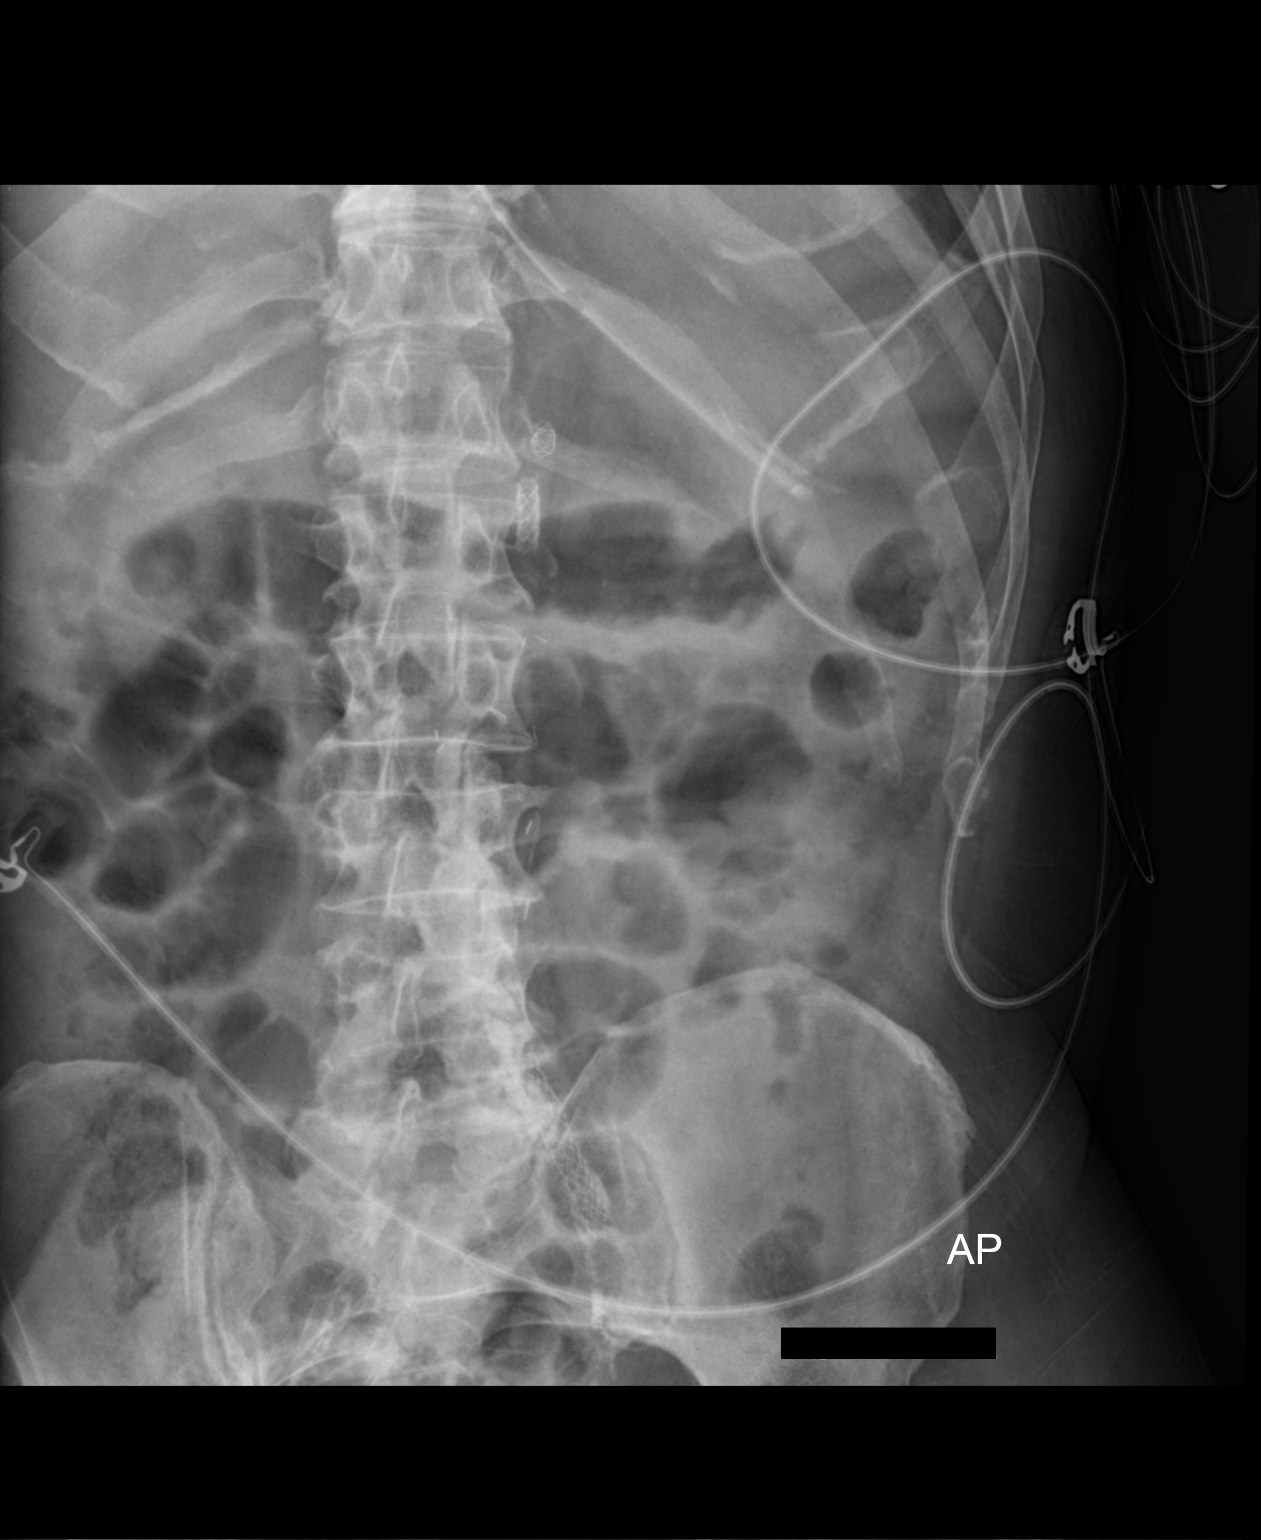
[im 2/2]
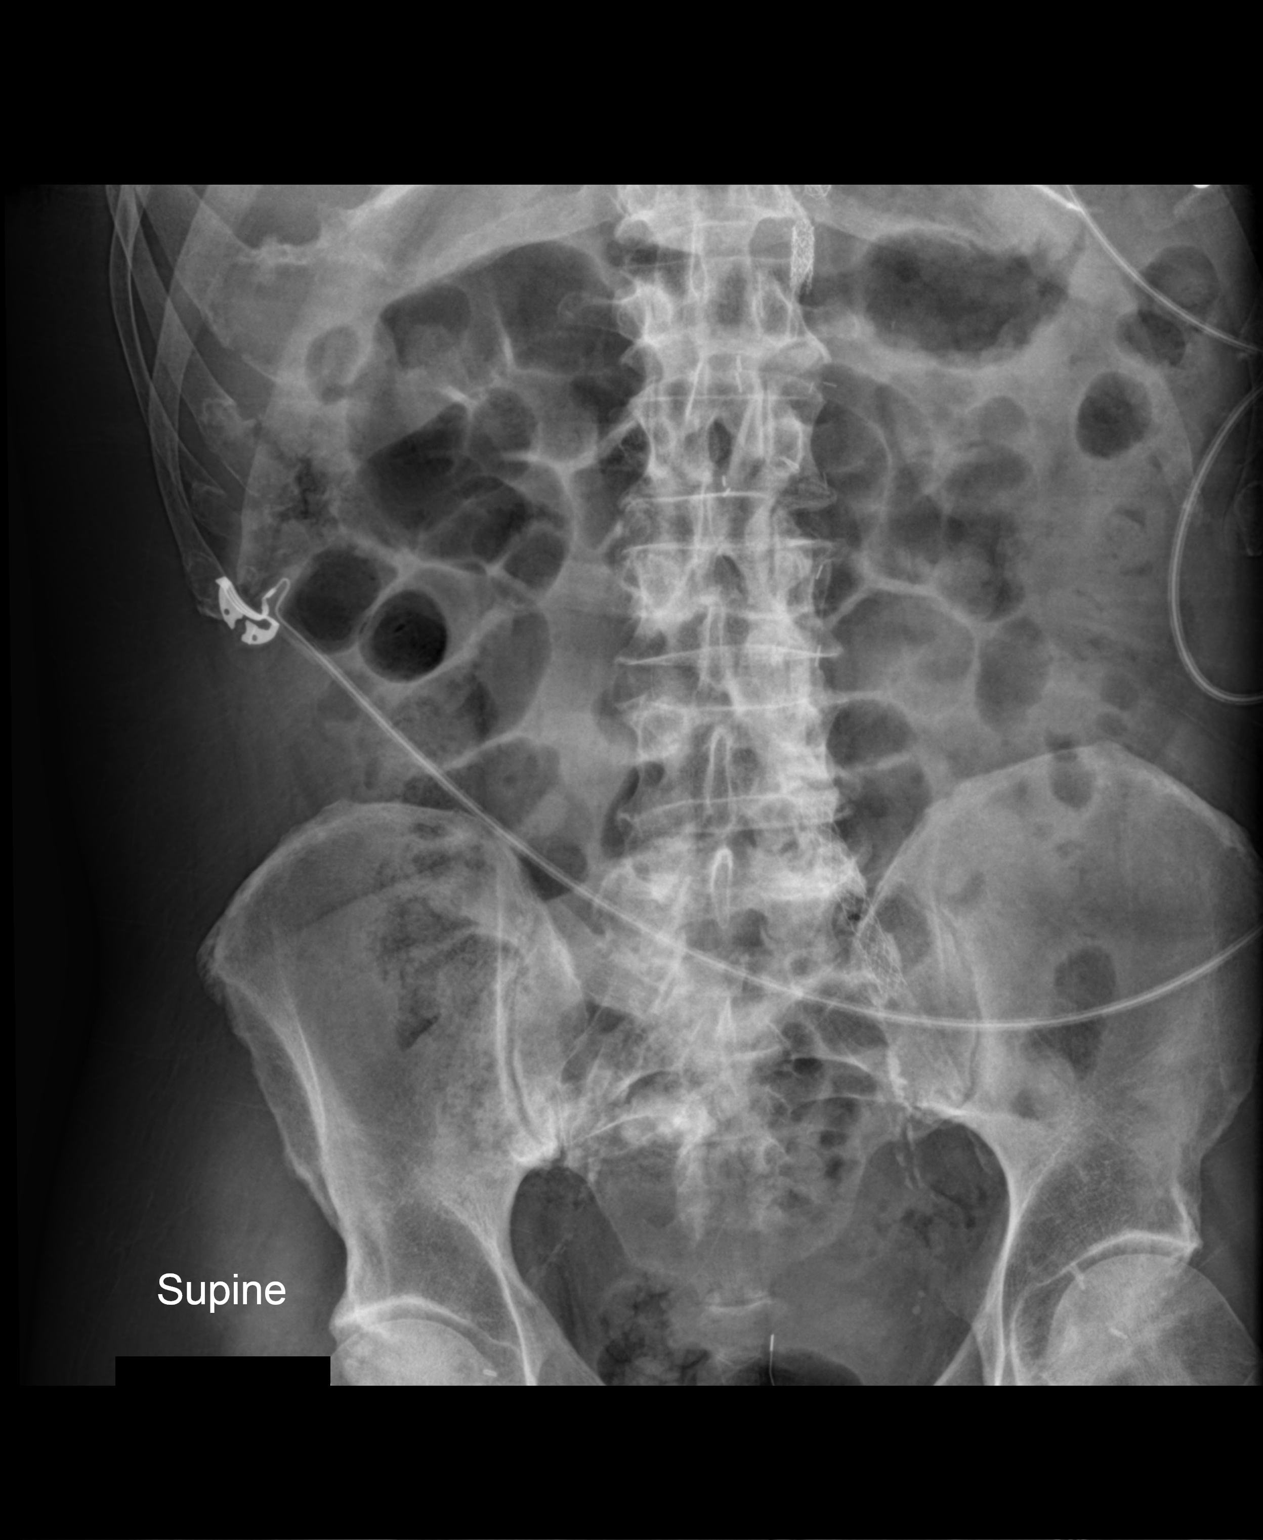

[2 of 2 positions shown; findings below may reference images not displayed]

FINDINGS: Nonobstructive bowel gas pattern. Unremarkable colonic stool burden.
Nondiagnostic evaluation for pneumoperitoneum secondary to supine
positioning and exclusion of the lower thorax. No definite
pneumatosis or portal venous gas. Enteric tube tip and side port
overlie the expected location of the gastric fundus.

Vascular stents overlie the expected location of the left common
iliac artery, SMA and possibly the celiac artery. Multiple surgical
clips overlie the bilateral groins. A for Mr. overlies expected
location of the urinary bladder.

Mild multilevel DDD is suspected within the lumbar spine,
incompletely evaluated. No acute osseus abnormalities.
IMPRESSION: Nonobstructive bowel gas pattern. Unremarkable colonic stool burden.

## 2014-06-18 IMAGING — CR DG CHEST 1V PORT
1 series · 1 of 1 positions shown · non-contrast
Comparison: 08/19/2013

CLINICAL DATA: Acute respiratory failure. On ventilator.

EXAM:
PORTABLE CHEST - 1 VIEW

[AP]
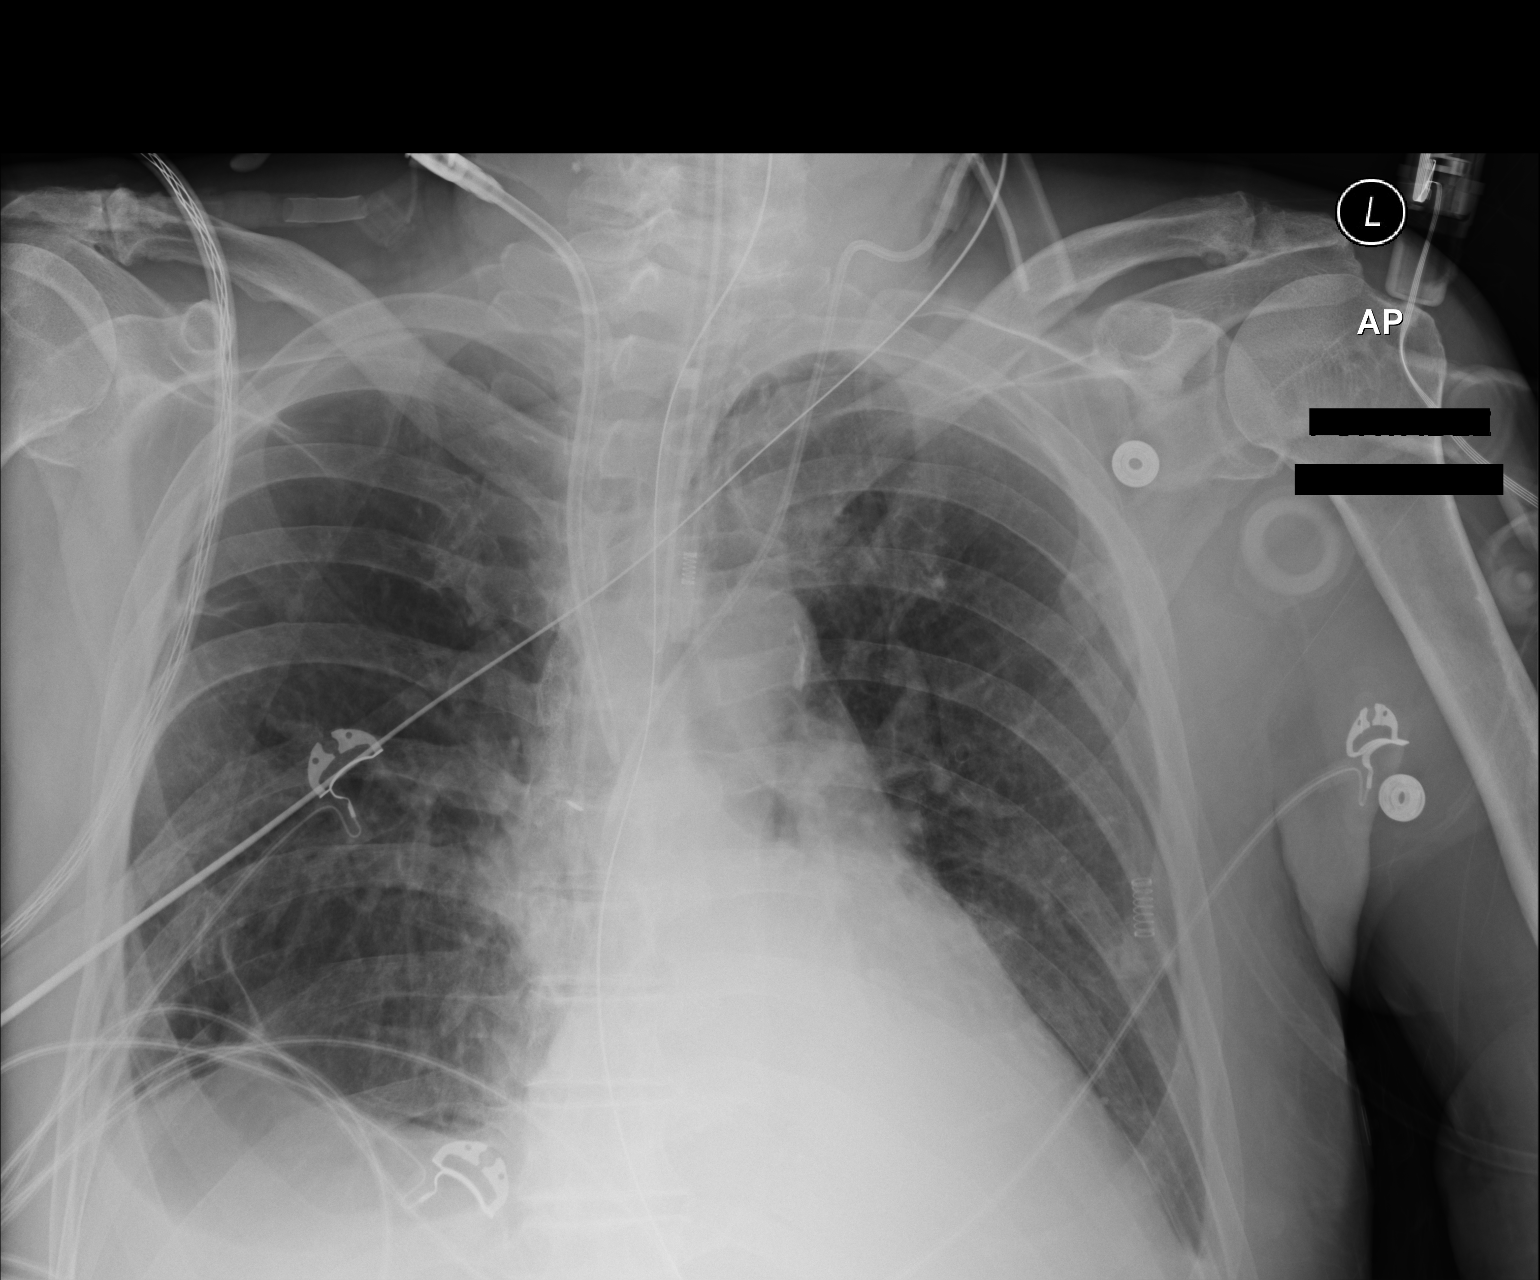

[1 of 1 positions shown; findings below may reference images not displayed]

FINDINGS: Endotracheal tube tip is approximately 1.5 cm above the carina.
Other support lines and tubes remain in appropriate position. No
pneumothorax identified.

Increased opacity seen in the left retrocardiac lung base, which may
be due to atelectasis or consolidation. Small right pleural effusion
also shows mild increase in size. Left pleural effusion cannot be
excluded on this portable exam.
IMPRESSION: Increased left retrocardiac atelectasis versus consolidation.

Mild increase in small right pleural effusion. Small left pleural
effusion cannot be excluded on this portable exam.

Endotracheal tube tip approximately 1.5 cm above carina.
